# Patient Record
Sex: Female | Born: 1980 | Hispanic: No | Marital: Single | Smoking: Former smoker
Health system: Southern US, Community
[De-identification: ages and names within clinical notes are randomized; demographics above are authoritative.]

## PROBLEM LIST (undated history)

## (undated) DIAGNOSIS — E876 Hypokalemia: Secondary | ICD-10-CM

## (undated) DIAGNOSIS — E538 Deficiency of other specified B group vitamins: Secondary | ICD-10-CM

## (undated) DIAGNOSIS — G4730 Sleep apnea, unspecified: Secondary | ICD-10-CM

## (undated) DIAGNOSIS — F1011 Alcohol abuse, in remission: Secondary | ICD-10-CM

## (undated) DIAGNOSIS — D649 Anemia, unspecified: Secondary | ICD-10-CM

## (undated) DIAGNOSIS — K219 Gastro-esophageal reflux disease without esophagitis: Secondary | ICD-10-CM

## (undated) DIAGNOSIS — G459 Transient cerebral ischemic attack, unspecified: Secondary | ICD-10-CM

## (undated) DIAGNOSIS — M7989 Other specified soft tissue disorders: Secondary | ICD-10-CM

## (undated) DIAGNOSIS — I10 Essential (primary) hypertension: Secondary | ICD-10-CM

## (undated) DIAGNOSIS — M549 Dorsalgia, unspecified: Secondary | ICD-10-CM

## (undated) DIAGNOSIS — K5900 Constipation, unspecified: Secondary | ICD-10-CM

## (undated) DIAGNOSIS — E559 Vitamin D deficiency, unspecified: Secondary | ICD-10-CM

## (undated) DIAGNOSIS — R079 Chest pain, unspecified: Secondary | ICD-10-CM

## (undated) DIAGNOSIS — E611 Iron deficiency: Secondary | ICD-10-CM

## (undated) DIAGNOSIS — E739 Lactose intolerance, unspecified: Secondary | ICD-10-CM

## (undated) HISTORY — DX: Transient cerebral ischemic attack, unspecified: G45.9

## (undated) HISTORY — DX: Constipation, unspecified: K59.00

## (undated) HISTORY — DX: Other specified soft tissue disorders: M79.89

## (undated) HISTORY — DX: Hypokalemia: E87.6

## (undated) HISTORY — DX: Dorsalgia, unspecified: M54.9

## (undated) HISTORY — DX: Chest pain, unspecified: R07.9

## (undated) HISTORY — DX: Lactose intolerance, unspecified: E73.9

## (undated) HISTORY — DX: Deficiency of other specified B group vitamins: E53.8

## (undated) HISTORY — DX: Sleep apnea, unspecified: G47.30

## (undated) HISTORY — DX: Anemia, unspecified: D64.9

## (undated) HISTORY — DX: Alcohol abuse, in remission: F10.11

## (undated) HISTORY — DX: Iron deficiency: E61.1

## (undated) HISTORY — DX: Gastro-esophageal reflux disease without esophagitis: K21.9

## (undated) HISTORY — DX: Vitamin D deficiency, unspecified: E55.9

---

## 2005-11-18 ENCOUNTER — Inpatient Hospital Stay (HOSPITAL_COMMUNITY): Admission: AD | Admit: 2005-11-18 | Discharge: 2005-11-18 | Payer: Self-pay | Admitting: Gynecology

## 2009-10-08 ENCOUNTER — Inpatient Hospital Stay (HOSPITAL_COMMUNITY): Admission: AD | Admit: 2009-10-08 | Discharge: 2009-10-08 | Payer: Self-pay | Admitting: Family Medicine

## 2009-10-08 ENCOUNTER — Ambulatory Visit: Payer: Self-pay | Admitting: Nurse Practitioner

## 2010-04-10 LAB — URINALYSIS, ROUTINE W REFLEX MICROSCOPIC
Glucose, UA: NEGATIVE mg/dL
Ketones, ur: NEGATIVE mg/dL
Protein, ur: NEGATIVE mg/dL

## 2010-04-10 LAB — URINE MICROSCOPIC-ADD ON

## 2010-04-10 LAB — WET PREP, GENITAL
Trich, Wet Prep: NONE SEEN
Yeast Wet Prep HPF POC: NONE SEEN

## 2010-04-10 LAB — GC/CHLAMYDIA PROBE AMP, GENITAL: GC Probe Amp, Genital: NEGATIVE

## 2010-05-03 ENCOUNTER — Emergency Department (HOSPITAL_BASED_OUTPATIENT_CLINIC_OR_DEPARTMENT_OTHER)
Admission: EM | Admit: 2010-05-03 | Discharge: 2010-05-03 | Disposition: A | Discharge status: Home / Self Care | Payer: Medicaid Other | Attending: Emergency Medicine | Admitting: Emergency Medicine

## 2010-05-03 DIAGNOSIS — F17200 Nicotine dependence, unspecified, uncomplicated: Secondary | ICD-10-CM | POA: Insufficient documentation

## 2010-05-03 DIAGNOSIS — Z8679 Personal history of other diseases of the circulatory system: Secondary | ICD-10-CM | POA: Insufficient documentation

## 2010-05-03 DIAGNOSIS — R109 Unspecified abdominal pain: Secondary | ICD-10-CM | POA: Insufficient documentation

## 2010-05-03 DIAGNOSIS — I10 Essential (primary) hypertension: Secondary | ICD-10-CM | POA: Insufficient documentation

## 2010-05-03 DIAGNOSIS — O2690 Pregnancy related conditions, unspecified, unspecified trimester: Secondary | ICD-10-CM | POA: Insufficient documentation

## 2010-05-03 LAB — URINALYSIS, ROUTINE W REFLEX MICROSCOPIC
Glucose, UA: NEGATIVE mg/dL
Hgb urine dipstick: NEGATIVE
Specific Gravity, Urine: 1.025 (ref 1.005–1.030)

## 2010-05-13 ENCOUNTER — Emergency Department (HOSPITAL_BASED_OUTPATIENT_CLINIC_OR_DEPARTMENT_OTHER)
Admission: EM | Admit: 2010-05-13 | Discharge: 2010-05-13 | Disposition: A | Discharge status: Home / Self Care | Payer: Medicaid Other | Attending: Emergency Medicine | Admitting: Emergency Medicine

## 2010-05-13 ENCOUNTER — Encounter (HOSPITAL_BASED_OUTPATIENT_CLINIC_OR_DEPARTMENT_OTHER): Payer: Self-pay | Admitting: Radiology

## 2010-05-13 ENCOUNTER — Emergency Department (INDEPENDENT_AMBULATORY_CARE_PROVIDER_SITE_OTHER): Payer: Medicaid Other

## 2010-05-13 DIAGNOSIS — O200 Threatened abortion: Secondary | ICD-10-CM

## 2010-05-13 DIAGNOSIS — Z8679 Personal history of other diseases of the circulatory system: Secondary | ICD-10-CM | POA: Insufficient documentation

## 2010-05-13 DIAGNOSIS — O2390 Unspecified genitourinary tract infection in pregnancy, unspecified trimester: Secondary | ICD-10-CM | POA: Insufficient documentation

## 2010-05-13 DIAGNOSIS — I10 Essential (primary) hypertension: Secondary | ICD-10-CM | POA: Insufficient documentation

## 2010-05-13 DIAGNOSIS — F17200 Nicotine dependence, unspecified, uncomplicated: Secondary | ICD-10-CM | POA: Insufficient documentation

## 2010-05-13 DIAGNOSIS — N390 Urinary tract infection, site not specified: Secondary | ICD-10-CM | POA: Insufficient documentation

## 2010-05-13 DIAGNOSIS — O209 Hemorrhage in early pregnancy, unspecified: Secondary | ICD-10-CM | POA: Insufficient documentation

## 2010-05-13 LAB — WET PREP, GENITAL
Trich, Wet Prep: NONE SEEN
Yeast Wet Prep HPF POC: NONE SEEN

## 2010-05-13 LAB — URINALYSIS, ROUTINE W REFLEX MICROSCOPIC
Ketones, ur: NEGATIVE mg/dL
Nitrite: NEGATIVE
Protein, ur: NEGATIVE mg/dL

## 2010-05-13 LAB — URINE MICROSCOPIC-ADD ON

## 2010-05-13 LAB — RPR: RPR Ser Ql: NONREACTIVE

## 2010-05-13 LAB — ABO/RH: ABO/RH(D): O POS

## 2010-05-14 LAB — URINE CULTURE: Culture  Setup Time: 201204171851

## 2010-05-14 LAB — GC/CHLAMYDIA PROBE AMP, GENITAL: GC Probe Amp, Genital: NEGATIVE

## 2010-05-16 ENCOUNTER — Inpatient Hospital Stay (HOSPITAL_COMMUNITY)
Admission: AD | Admit: 2010-05-16 | Discharge: 2010-05-16 | Disposition: A | Discharge status: Home / Self Care | Payer: Medicaid Other | Source: Ambulatory Visit | Attending: Obstetrics and Gynecology | Admitting: Obstetrics and Gynecology

## 2010-05-16 DIAGNOSIS — O200 Threatened abortion: Secondary | ICD-10-CM | POA: Insufficient documentation

## 2010-05-16 LAB — HCG, QUANTITATIVE, PREGNANCY: hCG, Beta Chain, Quant, S: 54 m[IU]/mL — ABNORMAL HIGH (ref <5)

## 2010-05-26 ENCOUNTER — Other Ambulatory Visit: Payer: Self-pay

## 2010-07-29 ENCOUNTER — Emergency Department (INDEPENDENT_AMBULATORY_CARE_PROVIDER_SITE_OTHER): Payer: Medicaid Other

## 2010-07-29 ENCOUNTER — Emergency Department (HOSPITAL_BASED_OUTPATIENT_CLINIC_OR_DEPARTMENT_OTHER)
Admission: EM | Admit: 2010-07-29 | Discharge: 2010-07-29 | Disposition: A | Discharge status: Home / Self Care | Payer: Medicaid Other | Attending: Emergency Medicine | Admitting: Emergency Medicine

## 2010-07-29 DIAGNOSIS — N9489 Other specified conditions associated with female genital organs and menstrual cycle: Secondary | ICD-10-CM | POA: Insufficient documentation

## 2010-07-29 DIAGNOSIS — I10 Essential (primary) hypertension: Secondary | ICD-10-CM | POA: Insufficient documentation

## 2010-07-29 DIAGNOSIS — R109 Unspecified abdominal pain: Secondary | ICD-10-CM

## 2010-07-29 DIAGNOSIS — Z8679 Personal history of other diseases of the circulatory system: Secondary | ICD-10-CM | POA: Insufficient documentation

## 2010-07-29 LAB — COMPREHENSIVE METABOLIC PANEL
ALT: 30 U/L (ref 0–35)
AST: 22 U/L (ref 0–37)
Albumin: 3.5 g/dL (ref 3.5–5.2)
Alkaline Phosphatase: 83 U/L (ref 39–117)
Chloride: 103 mEq/L (ref 96–112)
Potassium: 4 mEq/L (ref 3.5–5.1)
Sodium: 138 mEq/L (ref 135–145)
Total Bilirubin: 0.2 mg/dL — ABNORMAL LOW (ref 0.3–1.2)
Total Protein: 7.8 g/dL (ref 6.0–8.3)

## 2010-07-29 LAB — PREGNANCY, URINE: Preg Test, Ur: NEGATIVE

## 2010-07-29 LAB — DIFFERENTIAL
Basophils Absolute: 0 10*3/uL (ref 0.0–0.1)
Basophils Relative: 1 % (ref 0–1)
Eosinophils Absolute: 0.1 10*3/uL (ref 0.0–0.7)
Eosinophils Relative: 2 % (ref 0–5)
Lymphocytes Relative: 25 % (ref 12–46)

## 2010-07-29 LAB — URINALYSIS, ROUTINE W REFLEX MICROSCOPIC
Ketones, ur: NEGATIVE mg/dL
Leukocytes, UA: NEGATIVE
Nitrite: NEGATIVE
Urobilinogen, UA: 0.2 mg/dL (ref 0.0–1.0)
pH: 6 (ref 5.0–8.0)

## 2010-07-29 LAB — CBC
HCT: 39 % (ref 36.0–46.0)
MCHC: 34.4 g/dL (ref 30.0–36.0)
Platelets: 366 10*3/uL (ref 150–400)
RDW: 16.5 % — ABNORMAL HIGH (ref 11.5–15.5)
WBC: 7.7 10*3/uL (ref 4.0–10.5)

## 2010-10-04 ENCOUNTER — Emergency Department (HOSPITAL_BASED_OUTPATIENT_CLINIC_OR_DEPARTMENT_OTHER): Payer: Medicaid Other

## 2010-10-04 ENCOUNTER — Emergency Department (HOSPITAL_BASED_OUTPATIENT_CLINIC_OR_DEPARTMENT_OTHER)
Admission: EM | Admit: 2010-10-04 | Discharge: 2010-10-04 | Discharge status: Against Medical Advice | Payer: Medicaid Other | Attending: Emergency Medicine | Admitting: Emergency Medicine

## 2010-10-04 ENCOUNTER — Encounter (HOSPITAL_BASED_OUTPATIENT_CLINIC_OR_DEPARTMENT_OTHER): Payer: Self-pay | Admitting: *Deleted

## 2010-10-04 DIAGNOSIS — Y92009 Unspecified place in unspecified non-institutional (private) residence as the place of occurrence of the external cause: Secondary | ICD-10-CM | POA: Insufficient documentation

## 2010-10-04 DIAGNOSIS — R51 Headache: Secondary | ICD-10-CM | POA: Insufficient documentation

## 2010-10-04 DIAGNOSIS — R1032 Left lower quadrant pain: Secondary | ICD-10-CM | POA: Insufficient documentation

## 2010-10-04 DIAGNOSIS — I10 Essential (primary) hypertension: Secondary | ICD-10-CM | POA: Insufficient documentation

## 2010-10-04 DIAGNOSIS — R109 Unspecified abdominal pain: Secondary | ICD-10-CM

## 2010-10-04 DIAGNOSIS — O2690 Pregnancy related conditions, unspecified, unspecified trimester: Secondary | ICD-10-CM | POA: Insufficient documentation

## 2010-10-04 DIAGNOSIS — M79609 Pain in unspecified limb: Secondary | ICD-10-CM | POA: Insufficient documentation

## 2010-10-04 HISTORY — DX: Essential (primary) hypertension: I10

## 2010-10-04 LAB — URINALYSIS, ROUTINE W REFLEX MICROSCOPIC
Glucose, UA: NEGATIVE mg/dL
Hgb urine dipstick: NEGATIVE
Ketones, ur: NEGATIVE mg/dL
Leukocytes, UA: NEGATIVE
Protein, ur: NEGATIVE mg/dL

## 2010-10-04 LAB — BASIC METABOLIC PANEL
BUN: 11 mg/dL (ref 6–23)
CO2: 22 mEq/L (ref 19–32)
Calcium: 9.7 mg/dL (ref 8.4–10.5)
Creatinine, Ser: 0.7 mg/dL (ref 0.50–1.10)
Glucose, Bld: 91 mg/dL (ref 70–99)

## 2010-10-04 LAB — CBC
HCT: 41.6 % (ref 36.0–46.0)
Hemoglobin: 14.3 g/dL (ref 12.0–15.0)
MCH: 27.4 pg (ref 26.0–34.0)
MCHC: 34.4 g/dL (ref 30.0–36.0)

## 2010-10-04 MED ORDER — ACETAMINOPHEN 80 MG PO CHEW: 650.0000 mg | CHEWABLE_TABLET | Freq: Once | ORAL | Status: DC

## 2010-10-04 MED ORDER — ACETAMINOPHEN 325 MG PO TABS
650.0000 mg | ORAL_TABLET | Freq: Once | ORAL | Status: AC
  Administered 2010-10-04: 650 mg via ORAL
  Filled 2010-10-04: qty 1

## 2010-10-04 NOTE — ED Notes (Signed)
Unable to locate pt in room or wtg room; pt belongings are not in room.  PA notified.

## 2010-10-04 NOTE — ED Notes (Signed)
Pt refused XR
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
## 2010-10-06 NOTE — ED Provider Notes (Signed)
Medical screening examination/treatment/procedure(s) were performed by non-physician practitioner and as supervising physician I was immediately available for consultation/collaboration.  Forbes Cellar, MD 10/06/10 [phone]

## 2011-12-07 ENCOUNTER — Emergency Department (HOSPITAL_BASED_OUTPATIENT_CLINIC_OR_DEPARTMENT_OTHER)
Admission: EM | Admit: 2011-12-07 | Discharge: 2011-12-07 | Disposition: A | Discharge status: Home / Self Care | Payer: Self-pay | Attending: Emergency Medicine | Admitting: Emergency Medicine

## 2011-12-07 ENCOUNTER — Encounter (HOSPITAL_BASED_OUTPATIENT_CLINIC_OR_DEPARTMENT_OTHER): Payer: Self-pay | Admitting: Family Medicine

## 2011-12-07 DIAGNOSIS — N938 Other specified abnormal uterine and vaginal bleeding: Secondary | ICD-10-CM | POA: Insufficient documentation

## 2011-12-07 DIAGNOSIS — N949 Unspecified condition associated with female genital organs and menstrual cycle: Secondary | ICD-10-CM | POA: Insufficient documentation

## 2011-12-07 DIAGNOSIS — I10 Essential (primary) hypertension: Secondary | ICD-10-CM | POA: Insufficient documentation

## 2011-12-07 DIAGNOSIS — Z79899 Other long term (current) drug therapy: Secondary | ICD-10-CM | POA: Insufficient documentation

## 2011-12-07 DIAGNOSIS — F17200 Nicotine dependence, unspecified, uncomplicated: Secondary | ICD-10-CM | POA: Insufficient documentation

## 2011-12-07 LAB — BASIC METABOLIC PANEL
GFR calc non Af Amer: >90 mL/min (ref >90)
Glucose, Bld: 89 mg/dL (ref 70–99)
Potassium: 3.7 mEq/L (ref 3.5–5.1)
Sodium: 139 mEq/L (ref 135–145)

## 2011-12-07 LAB — CBC WITH DIFFERENTIAL/PLATELET
Eosinophils Absolute: 0.1 10*3/uL (ref 0.0–0.7)
Lymphocytes Relative: 30 % (ref 12–46)
Lymphs Abs: 1.4 10*3/uL (ref 0.7–4.0)
MCH: 25.4 pg — ABNORMAL LOW (ref 26.0–34.0)
Neutrophils Relative %: 56 % (ref 43–77)
Platelets: 349 10*3/uL (ref 150–400)
RBC: 4.89 MIL/uL (ref 3.87–5.11)
WBC: 4.8 10*3/uL (ref 4.0–10.5)

## 2011-12-07 LAB — WET PREP, GENITAL: Trich, Wet Prep: NONE SEEN

## 2011-12-07 LAB — HCG, QUANTITATIVE, PREGNANCY: hCG, Beta Chain, Quant, S: <1 m[IU]/mL (ref <5)

## 2011-12-07 NOTE — ED Notes (Signed)
Pt c/o vaginal bleeding and thinks she "might have been pregnant but not sure".

## 2011-12-07 NOTE — ED Provider Notes (Signed)
History     CSN: [id number]  Arrival date & time 12/07/11  1421   First MD Initiated Contact with Patient 12/07/11 1442      Chief Complaint  Patient presents with  . Vaginal Bleeding    (Consider location/radiation/quality/duration/timing/severity/associated sxs/prior treatment) HPI Comments: Patient presents with complaint of "possible miscarriage". Patient states that has had 2 miscarriages in the past to include and ectopic. Patient is sexually active with one partner and no using contraception. She also states that her LMP: was 10/18/11 and normal. She states that she missed her period in October but began bleeding heavily today. Patient states she took a pregnancy test in October and it looked like it might have been positive. Denies fever or chills. Denies NVD or abdominal pain. Denies vaginal discharge or dyspareunia.  The history is provided by the patient. No language interpreter was used.    Past Medical History  Diagnosis Date  . Hypertension     History reviewed. No pertinent past surgical history.  No family history on file.  History  Substance Use Topics  . Smoking status: Current Every Day Smoker  . Smokeless tobacco: Not on file  . Alcohol Use: No    OB History    Grav Para Term Preterm Abortions TAB SAB Ect Mult Living   1               Review of Systems  Constitutional: Negative for fever and chills.  HENT: Negative for neck pain.   Gastrointestinal: Negative for nausea, vomiting and abdominal pain.  Genitourinary: Positive for vaginal bleeding and menstrual problem. Negative for vaginal discharge and dyspareunia.  Skin: Negative for rash.    Allergies  Latex and Other  Home Medications   Current Outpatient Rx  Name  Route  Sig  Dispense  Refill  . CLONIDINE HCL 0.2 MG PO TABS   Oral   Take 0.2 mg by mouth at bedtime.           BP 187/106  Pulse 97  Temp 99.1 F (37.3 C) (Oral)  Resp 18  Ht 5\' 1"  (1.549 m)  Wt 230 lb (104.327  kg)  BMI 43.46 kg/m2  SpO2 100%  LMP 10/18/2011  Breastfeeding? Unknown  Physical Exam  Nursing note and vitals reviewed. Constitutional: She appears well-developed and well-nourished.  HENT:  Head: Normocephalic and atraumatic.  Mouth/Throat: Oropharynx is clear and moist. No oropharyngeal exudate.  Eyes: Pupils are equal, round, and reactive to light. No scleral icterus.  Neck: Normal range of motion. Neck supple.  Cardiovascular: Normal rate, regular rhythm and normal heart sounds.   Pulmonary/Chest: Effort normal and breath sounds normal.  Abdominal: Soft. Bowel sounds are normal. There is no tenderness.  Genitourinary: Vagina normal and uterus normal. No vaginal discharge found.       Cervical os closed with moderate amount blood in the vaginal canal. No CMT or adnexal tenderness.  Lymphadenopathy:    She has no cervical adenopathy.  Neurological: She is alert.  Skin: Skin is warm and dry.    ED Course  Procedures (including critical care time)  Labs Reviewed  WET PREP, GENITAL - Abnormal; Notable for the following:    Clue Cells Wet Prep HPF POC FEW (*)     WBC, Wet Prep HPF POC RARE (*)     All other components within normal limits  CBC WITH DIFFERENTIAL - Abnormal; Notable for the following:    MCV 77.1 (*)     MCH 25.4 (*)  RDW 18.5 (*)     All other components within normal limits  PREGNANCY, URINE  BASIC METABOLIC PANEL  HCG, QUANTITATIVE, PREGNANCY  GC/CHLAMYDIA PROBE AMP   Results for orders placed during the hospital encounter of 12/07/11  PREGNANCY, URINE      Component Value Range   Preg Test, Ur NEGATIVE  NEGATIVE  WET PREP, GENITAL      Component Value Range   Yeast Wet Prep HPF POC NONE SEEN  NONE SEEN   Trich, Wet Prep NONE SEEN  NONE SEEN   Clue Cells Wet Prep HPF POC FEW (*) NONE SEEN   WBC, Wet Prep HPF POC RARE (*) NONE SEEN  CBC WITH DIFFERENTIAL      Component Value Range   WBC 4.8  4.0 - 10.5 K/uL   RBC 4.89  3.87 - 5.11 MIL/uL    Hemoglobin 12.4  12.0 - 15.0 g/dL   HCT 19.1  47.8 - 29.5 %   MCV 77.1 (*) 78.0 - 100.0 fL   MCH 25.4 (*) 26.0 - 34.0 pg   MCHC 32.9  30.0 - 36.0 g/dL   RDW 62.1 (*) 30.8 - 65.7 %   Platelets 349  150 - 400 K/uL   Neutrophils Relative 56  43 - 77 %   Neutro Abs 2.7  1.7 - 7.7 K/uL   Lymphocytes Relative 30  12 - 46 %   Lymphs Abs 1.4  0.7 - 4.0 K/uL   Monocytes Relative 11  3 - 12 %   Monocytes Absolute 0.5  0.1 - 1.0 K/uL   Eosinophils Relative 2  0 - 5 %   Eosinophils Absolute 0.1  0.0 - 0.7 K/uL   Basophils Relative 1  0 - 1 %   Basophils Absolute 0.0  0.0 - 0.1 K/uL  BASIC METABOLIC PANEL      Component Value Range   Sodium 139  135 - 145 mEq/L   Potassium 3.7  3.5 - 5.1 mEq/L   Chloride 104  96 - 112 mEq/L   CO2 23  19 - 32 mEq/L   Glucose, Bld 89  70 - 99 mg/dL   BUN 10  6 - 23 mg/dL   Creatinine, Ser 8.46  0.50 - 1.10 mg/dL   Calcium 8.5  8.4 - 96.2 mg/dL   GFR calc non Af Amer >90  >90 mL/min   GFR calc Af Amer >90  >90 mL/min  HCG, QUANTITATIVE, PREGNANCY      Component Value Range   hCG, Beta Chain, Quant, S <1  <5 mIU/mL    No results found.   1. Dysfunctional uterine bleeding       MDM  Patient presented with dysfunctional uterine bleeding. Pregnancy test and Hcg quant unremarkable. Wet prep: unremarkable. Patient reassured that this may be a late period but needs further evaluation. Patient has follow-up appointment with her OB/GYN on Thurs. Return precautions given verbally and in discharge summary. No red flags for missed, threatened abortion, or PID.        Pixie Casino, PA-C 12/07/11 2322

## 2011-12-08 NOTE — ED Provider Notes (Signed)
Medical screening examination/treatment/procedure(s) were performed by non-physician practitioner and as supervising physician I was immediately available for consultation/collaboration.   Reeshemah Nazaryan, MD 12/08/11 0011 

## 2013-11-27 ENCOUNTER — Encounter (HOSPITAL_BASED_OUTPATIENT_CLINIC_OR_DEPARTMENT_OTHER): Payer: Self-pay | Admitting: Family Medicine

## 2014-07-11 ENCOUNTER — Ambulatory Visit (INDEPENDENT_AMBULATORY_CARE_PROVIDER_SITE_OTHER): Payer: 59 | Admitting: Family Medicine

## 2014-07-11 VITALS — BP 180/120 | Temp 98.6°F | Resp 17 | Ht 62.0 in | Wt 274.6 lb

## 2014-07-11 DIAGNOSIS — I10 Essential (primary) hypertension: Secondary | ICD-10-CM | POA: Diagnosis not present

## 2014-07-11 DIAGNOSIS — T781XXA Other adverse food reactions, not elsewhere classified, initial encounter: Secondary | ICD-10-CM

## 2014-07-11 LAB — CBC
HCT: 38.2 % (ref 36.0–46.0)
Hemoglobin: 11.7 g/dL — ABNORMAL LOW (ref 12.0–15.0)
MCH: 22.7 pg — AB (ref 26.0–34.0)
MCHC: 30.6 g/dL (ref 30.0–36.0)
MCV: 74 fL — AB (ref 78.0–100.0)
MPV: 8.8 fL (ref 8.6–12.4)
PLATELETS: 609 10*3/uL — AB (ref 150–400)
RBC: 5.16 MIL/uL — AB (ref 3.87–5.11)
RDW: 20 % — AB (ref 11.5–15.5)
WBC: 6.7 10*3/uL (ref 4.0–10.5)

## 2014-07-11 LAB — COMPREHENSIVE METABOLIC PANEL
ALBUMIN: 3.9 g/dL (ref 3.5–5.2)
ALT: 28 U/L (ref 0–35)
AST: 21 U/L (ref 0–37)
Alkaline Phosphatase: 77 U/L (ref 39–117)
BUN: 14 mg/dL (ref 6–23)
CALCIUM: 9.5 mg/dL (ref 8.4–10.5)
CHLORIDE: 106 meq/L (ref 96–112)
CO2: 25 meq/L (ref 19–32)
Creat: 0.93 mg/dL (ref 0.50–1.10)
Glucose, Bld: 77 mg/dL (ref 70–99)
POTASSIUM: 4.5 meq/L (ref 3.5–5.3)
SODIUM: 139 meq/L (ref 135–145)
TOTAL PROTEIN: 7.6 g/dL (ref 6.0–8.3)
Total Bilirubin: 0.2 mg/dL (ref 0.2–1.2)

## 2014-07-11 LAB — HEMOGLOBIN A1C
Hgb A1c MFr Bld: 5.6 % (ref <5.7)
Mean Plasma Glucose: 114 mg/dL (ref <117)

## 2014-07-11 LAB — TSH: TSH: 1.287 u[IU]/mL (ref 0.350–4.500)

## 2014-07-11 MED ORDER — ADULT BLOOD PRESSURE CUFF LG KIT: 1.0000 [IU] | PACK | Freq: Every day | Status: DC

## 2014-07-11 MED ORDER — AMLODIPINE BESYLATE 5 MG PO TABS: 5.0000 mg | ORAL_TABLET | Freq: Every day | ORAL | Status: DC

## 2014-07-11 NOTE — Patient Instructions (Addendum)
Try quinoa   Hypertension Hypertension, commonly called high blood pressure, is when the force of blood pumping through your arteries is too strong. Your arteries are the blood vessels that carry blood from your heart throughout your body. A blood pressure reading consists of a higher number over a lower number, such as 110/72. The higher number (systolic) is the pressure inside your arteries when your heart pumps. The lower number (diastolic) is the pressure inside your arteries when your heart relaxes. Ideally you want your blood pressure below 120/80. Hypertension forces your heart to work harder to pump blood. Your arteries may become narrow or stiff. Having hypertension puts you at risk for heart disease, stroke, and other problems.  RISK FACTORS Some risk factors for high blood pressure are controllable. Others are not.  Risk factors you cannot control include:   Race. You may be at higher risk if you are African American.  Age. Risk increases with age.  Gender. Men are at higher risk than women before age 59 years. After age 18, women are at higher risk than men. Risk factors you can control include:  Not getting enough exercise or physical activity.  Being overweight.  Getting too much fat, sugar, calories, or salt in your diet.  Drinking too much alcohol. SIGNS AND SYMPTOMS Hypertension does not usually cause signs or symptoms. Extremely high blood pressure (hypertensive crisis) may cause headache, anxiety, shortness of breath, and nosebleed. DIAGNOSIS  To check if you have hypertension, your health care provider will measure your blood pressure while you are seated, with your arm held at the level of your heart. It should be measured at least twice using the same arm. Certain conditions can cause a difference in blood pressure between your right and left arms. A blood pressure reading that is higher than normal on one occasion does not mean that you need treatment. If one blood  pressure reading is high, ask your health care provider about having it checked again. TREATMENT  Treating high blood pressure includes making lifestyle changes and possibly taking medicine. Living a healthy lifestyle can help lower high blood pressure. You may need to change some of your habits. Lifestyle changes may include:  Following the DASH diet. This diet is high in fruits, vegetables, and whole grains. It is low in salt, red meat, and added sugars.  Getting at least 2 hours of brisk physical activity every week.  Losing weight if necessary.  Not smoking.  Limiting alcoholic beverages.  Learning ways to reduce stress. If lifestyle changes are not enough to get your blood pressure under control, your health care provider may prescribe medicine. You may need to take more than one. Work closely with your health care provider to understand the risks and benefits. HOME CARE INSTRUCTIONS  Have your blood pressure rechecked as directed by your health care provider.   Take medicines only as directed by your health care provider. Follow the directions carefully. Blood pressure medicines must be taken as prescribed. The medicine does not work as well when you skip doses. Skipping doses also puts you at risk for problems.   Do not smoke.   Monitor your blood pressure at home as directed by your health care provider. SEEK MEDICAL CARE IF:   You think you are having a reaction to medicines taken.  You have recurrent headaches or feel dizzy.  You have swelling in your ankles.  You have trouble with your vision. SEEK IMMEDIATE MEDICAL CARE IF:  You develop a  severe headache or confusion.  You have unusual weakness, numbness, or feel faint.  You have severe chest or abdominal pain.  You vomit repeatedly.  You have trouble breathing. MAKE SURE YOU:   Understand these instructions.  Will watch your condition.  Will get help right away if you are not doing well or get  worse. Document Released: 01/12/2005 Document Revised: 05/29/2013 Document Reviewed: 11/04/2012 [facility] Patient Information 2015 [place], [place]. This information is not intended to replace advice given to you by your health care provider. Make sure you discuss any questions you have with your health care provider. How to Take Your Blood Pressure HOW DO I GET A BLOOD PRESSURE MACHINE?  You can buy an electronic home blood pressure machine at your local pharmacy. Insurance will sometimes cover the cost if you have a prescription.  Ask your doctor what type of machine is best for you. There are different machines for your arm and your wrist.  If you decide to buy a machine to check your blood pressure on your arm, first check the size of your arm so you can buy the right size cuff. To check the size of your arm:   Use a measuring tape that shows both inches and centimeters.   Wrap the measuring tape around the upper-middle part of your arm. You may need someone to help you measure.   Write down your arm measurement in both inches and centimeters.   To measure your blood pressure correctly, it is important to have the right size cuff.   If your arm is up to 13 inches (up to 34 centimeters), get an adult cuff size.  If your arm is 13 to 17 inches (35 to 44 centimeters), get a large adult cuff size.    If your arm is 17 to 20 inches (45 to 52 centimeters), get an adult thigh cuff.  WHAT DO THE NUMBERS MEAN?   There are two numbers that make up your blood pressure. For example: 120/80.  The first number (120 in our example) is called the "systolic pressure." It is a measure of the pressure in your blood vessels when your heart is pumping blood.  The second number (80 in our example) is called the "diastolic pressure." It is a measure of the pressure in your blood vessels when your heart is resting between beats.  Your doctor will tell you what your blood pressure should be. WHAT  SHOULD I DO BEFORE I CHECK MY BLOOD PRESSURE?   Try to rest or relax for at least 30 minutes before you check your blood pressure.  Do not smoke.  Do not have any drinks with caffeine, such as:  Soda.  Coffee.  Tea.  Check your blood pressure in a quiet room.  Sit down and stretch out your arm on a table. Keep your arm at about the level of your heart. Let your arm relax.  Make sure that your legs are not crossed. HOW DO I CHECK MY BLOOD PRESSURE?  Follow the directions that came with your machine.  Make sure you remove any tight-fighting clothing from your arm or wrist. Wrap the cuff around your upper arm or wrist. You should be able to fit a finger between the cuff and your arm. If you cannot fit a finger between the cuff and your arm, it is too tight and should be removed and rewrapped.  Some units require you to manually pump up the arm cuff.  Automatic units inflate the cuff  when you press a button.  Cuff deflation is automatic in both models.  After the cuff is inflated, the unit measures your blood pressure and pulse. The readings are shown on a monitor. Hold still and breathe normally while the cuff is inflated.  Getting a reading takes less than a minute.  Some models store readings in a memory. Some provide a printout of readings. If your machine does not store your readings, keep a written record.  Take readings with you to your next visit with your doctor. Document Released: 12/26/2007 Document Revised: 05/29/2013 Document Reviewed: 03/09/2013 [facility] Patient Information 2015 [place], [place]. This information is not intended to replace advice given to you by your health care provider. Make sure you discuss any questions you have with your health care provider.    Why follow it? Research shows. . Those who follow the Mediterranean diet have a reduced risk of heart disease  . The diet is associated with a reduced incidence of Parkinson's and Alzheimer's  diseases . People following the diet may have longer life expectancies and lower rates of chronic diseases  . The Dietary Guidelines for Americans recommends the Mediterranean diet as an eating plan to promote health and prevent disease  What Is the Mediterranean Diet?  . Healthy eating plan based on typical foods and recipes of Mediterranean-style cooking . The diet is primarily a plant based diet; these foods should make up a majority of meals   Starches - Plant based foods should make up a majority of meals - They are an important sources of vitamins, minerals, energy, antioxidants, and fiber - Choose whole grains, foods high in fiber and minimally processed items  - Typical grain sources include wheat, oats, barley, corn, brown rice, bulgar, farro, millet, polenta, couscous  - Various types of beans include chickpeas, lentils, fava beans, black beans, white beans   Fruits  Veggies - Large quantities of antioxidant rich fruits & veggies; 6 or more servings  - Vegetables can be eaten raw or lightly drizzled with oil and cooked  - Vegetables common to the traditional Mediterranean Diet include: artichokes, arugula, beets, broccoli, brussel sprouts, cabbage, carrots, celery, collard greens, cucumbers, eggplant, kale, leeks, lemons, lettuce, mushrooms, okra, onions, peas, peppers, potatoes, pumpkin, radishes, rutabaga, shallots, spinach, sweet potatoes, turnips, zucchini - Fruits common to the Mediterranean Diet include: apples, apricots, avocados, cherries, clementines, dates, figs, grapefruits, grapes, melons, nectarines, oranges, peaches, pears, pomegranates, strawberries, tangerines  Fats - Replace butter and margarine with healthy oils, such as olive oil, canola oil, and tahini  - Limit nuts to no more than a handful a day  - Nuts include walnuts, almonds, pecans, pistachios, pine nuts  - Limit or avoid candied, honey roasted or heavily salted nuts - Olives are central to the [employer] - can be eaten whole or used in a variety of dishes   Meats Protein - Limiting red meat: no more than a few times a month - When eating red meat: choose lean cuts and keep the portion to the size of deck of cards - Eggs: approx. 0 to 4 times a week  - Fish and lean poultry: at least 2 a week  - Healthy protein sources include, chicken, [place], lean beef, lamb - Increase intake of seafood such as tuna, salmon, trout, mackerel, shrimp, scallops - Avoid or limit high fat processed meats such as sausage and bacon  Dairy - Include moderate amounts of low fat dairy products  - Focus on healthy dairy  such as fat free yogurt, skim milk, low or reduced fat cheese - Limit dairy products higher in fat such as whole or 2% milk, cheese, ice cream  Alcohol - Moderate amounts of red wine is ok  - No more than 5 oz daily for women (all ages) and men older than age 25  - No more than 10 oz of wine daily for men younger than 76  Other - Limit sweets and other desserts  - Use herbs and spices instead of salt to flavor foods  - Herbs and spices common to the traditional Mediterranean Diet include: basil, bay leaves, chives, cloves, cumin, fennel, garlic, lavender, marjoram, mint, oregano, parsley, pepper, rosemary, sage, savory, sumac, tarragon, thyme   It's not just a diet, it's a lifestyle:  . The Mediterranean diet includes lifestyle factors typical of those in the region  . Foods, drinks and meals are best eaten with others and savored . Daily physical activity is important for overall good health . This could be strenuous exercise like running and aerobics . This could also be more leisurely activities such as walking, housework, yard-work, or taking the stairs . Moderation is the key; a balanced and healthy diet accommodates most foods and drinks . Consider portion sizes and frequency of consumption of certain foods   Meal Ideas & Options:  . Breakfast:  o Whole wheat toast or whole wheat English  muffins with peanut butter & hard boiled egg o Steel cut oats topped with apples & cinnamon and skim milk  o Fresh fruit: banana, strawberries, melon, berries, peaches  o Smoothies: strawberries, bananas, greek yogurt, peanut butter o Low fat greek yogurt with blueberries and granola  o Egg white omelet with spinach and mushrooms o Breakfast couscous: whole wheat couscous, apricots, skim milk, cranberries  . Sandwiches:  o Hummus and grilled vegetables (peppers, zucchini, squash) on whole wheat bread   o Grilled chicken on whole wheat pita with lettuce, tomatoes, cucumbers or tzatziki  o Tuna salad on whole wheat bread: tuna salad made with greek yogurt, olives, red peppers, capers, green onions o Garlic rosemary lamb pita: lamb sauted with garlic, rosemary, salt & pepper; add lettuce, cucumber, greek yogurt to pita - flavor with lemon juice and black pepper  . Seafood:  o Mediterranean grilled salmon, seasoned with garlic, basil, parsley, lemon juice and black pepper o Shrimp, lemon, and spinach whole-grain pasta salad made with low fat greek yogurt  o Seared scallops with lemon orzo  o Seared tuna steaks seasoned salt, pepper, coriander topped with tomato mixture of olives, tomatoes, olive oil, minced garlic, parsley, green onions and cappers  . Meats:  o Herbed greek chicken salad with kalamata olives, cucumber, feta  o Red bell peppers stuffed with spinach, bulgur, lean ground beef (or lentils) & topped with feta   o Kebabs: skewers of chicken, tomatoes, onions, zucchini, squash  o [place] burgers: made with red onions, mint, dill, lemon juice, feta cheese topped with roasted red peppers . Vegetarian o Cucumber salad: cucumbers, artichoke hearts, celery, red onion, feta cheese, tossed in olive oil & lemon juice  o Hummus and whole grain pita points with a greek salad (lettuce, tomato, feta, olives, cucumbers, red onion) o Lentil soup with celery, carrots made with vegetable broth,  garlic, salt and pepper  o Tabouli salad: parsley, bulgur, mint, scallions, cucumbers, tomato, radishes, lemon juice, olive oil, salt and pepper.

## 2014-07-11 NOTE — Progress Notes (Signed)
   Subjective:    Patient ID: Cristina Adkins, female    DOB: 1981/01/25, 34 y.o.   MRN: [id number]  HPI This is a pleasant 34 yo female who presents today for HTN. She was diagnosed with HTN when she was a teenager. She has intermittently been on medication. She was most recently on clonidine which caused her to be fatigued so she stopped taking it. She had a miscarriage and believes it was related to her blood pressure medication. She is not currently sexually active or in a relationship.   She lives with her 9 yo daughter. They walk daily for exercise. She has struggled for a long time with her weight. She doesn't eat breakfast, she has a large flavored [employer] coffee for lunch and she cooks for dinner (fish/meat, vegetables). She has ear itching/popping, itching throat and lip swelling with eating fruits. She has had to avoid eating fruit for several years and reports that this extends to all fruits except apples which cause her to vomit. She does not like too many vegetables or salads. She does not drink regular soda, sweet tea or juice. She smokes 4-6 cigarettes a day. She works for [employer] and has a lot of stress with her job. She doesn't sleep well- difficulty falling and staying asleep.   She "rolled" her right ankle about a year ago and it still hurts and swells.   Review of Systems No chest pain, no SOB, right ankle swelling related to old injury, no headaches.     Objective:   Physical Exam  Constitutional: She is oriented to person, place, and time. She appears well-developed and well-nourished.  Morbidly obese.  HENT:  Head: Normocephalic and atraumatic.  Eyes: Conjunctivae are normal. Pupils are equal, round, and reactive to light.  Neck: Normal range of motion. Neck supple.  Cardiovascular: Normal rate, regular rhythm and normal heart sounds.   Pulmonary/Chest: Effort normal and breath sounds normal.  Musculoskeletal: Normal range of motion. She exhibits edema  (trace pretibial edema. Right, lateral ankle mildly swollen. ).  Neurological: She is alert and oriented to person, place, and time.  Skin: Skin is warm and dry.  Psychiatric: She has a normal mood and affect. Her behavior is normal. Judgment and thought content normal.  Vitals reviewed.  BP 180/20 mmHg  Temp(Src) 98.6 F (37 C) (Oral)  Resp 17  Ht $R'5\' 2"'Vh$  (1.575 m)  Wt 274 lb 9.6 oz (124.558 kg)  BMI 50.21 kg/m2  SpO2 95%  LMP 06/27/2014 Recheck of blood pressure- 174/98    Assessment & Plan:  1. Essential hypertension - amLODipine (NORVASC) 5 MG tablet; Take 1 tablet (5 mg total) by mouth daily.  Dispense: 90 tablet; Refill: 3 - Blood Pressure Monitoring (ADULT BLOOD PRESSURE CUFF LG) KIT; 1 Units by Does not apply route daily.  Dispense: 1 each; Refill: 0 - CBC - Comprehensive metabolic panel - follow up in 1 month  2. Morbid obesity - CBC - Comprehensive metabolic panel - TSH - Hemoglobin A1c - discussed need for regular, small meals, continued exercise. Encouraged slow and steady weight loss of 1/2-1 pound per week.  3. Allergic reaction to food - Ambulatory referral to [place], FNP-BC  [facility]  07/11/2014 2:18 PM

## 2014-07-16 ENCOUNTER — Other Ambulatory Visit: Payer: Self-pay | Admitting: Family Medicine

## 2014-07-16 DIAGNOSIS — D649 Anemia, unspecified: Secondary | ICD-10-CM

## 2014-07-16 MED ORDER — FERROUS SULFATE 325 (65 FE) MG PO TABS: 325.0000 mg | ORAL_TABLET | Freq: Two times a day (BID) | ORAL | Status: DC

## 2015-07-11 ENCOUNTER — Other Ambulatory Visit: Payer: Self-pay

## 2015-07-11 DIAGNOSIS — I10 Essential (primary) hypertension: Secondary | ICD-10-CM

## 2015-07-11 MED ORDER — AMLODIPINE BESYLATE 5 MG PO TABS: 5.0000 mg | ORAL_TABLET | Freq: Every day | ORAL | Status: DC

## 2015-11-24 ENCOUNTER — Other Ambulatory Visit: Payer: Self-pay | Admitting: Family Medicine

## 2015-11-24 DIAGNOSIS — I10 Essential (primary) hypertension: Secondary | ICD-10-CM

## 2017-03-29 ENCOUNTER — Encounter (HOSPITAL_BASED_OUTPATIENT_CLINIC_OR_DEPARTMENT_OTHER): Payer: Self-pay | Admitting: Emergency Medicine

## 2017-03-29 ENCOUNTER — Other Ambulatory Visit: Payer: Self-pay

## 2017-03-29 ENCOUNTER — Inpatient Hospital Stay (HOSPITAL_BASED_OUTPATIENT_CLINIC_OR_DEPARTMENT_OTHER): Payer: 59

## 2017-03-29 ENCOUNTER — Observation Stay (HOSPITAL_BASED_OUTPATIENT_CLINIC_OR_DEPARTMENT_OTHER)
Admission: EM | Admit: 2017-03-29 | Discharge: 2017-03-30 | Disposition: A | Discharge status: Home / Self Care | Payer: 59 | Attending: Internal Medicine | Admitting: Internal Medicine

## 2017-03-29 ENCOUNTER — Inpatient Hospital Stay (HOSPITAL_COMMUNITY): Payer: 59

## 2017-03-29 DIAGNOSIS — Z720 Tobacco use: Secondary | ICD-10-CM

## 2017-03-29 DIAGNOSIS — F17210 Nicotine dependence, cigarettes, uncomplicated: Secondary | ICD-10-CM | POA: Diagnosis not present

## 2017-03-29 DIAGNOSIS — D649 Anemia, unspecified: Secondary | ICD-10-CM | POA: Diagnosis not present

## 2017-03-29 DIAGNOSIS — R778 Other specified abnormalities of plasma proteins: Secondary | ICD-10-CM | POA: Diagnosis present

## 2017-03-29 DIAGNOSIS — Z79899 Other long term (current) drug therapy: Secondary | ICD-10-CM | POA: Diagnosis not present

## 2017-03-29 DIAGNOSIS — Z7982 Long term (current) use of aspirin: Secondary | ICD-10-CM | POA: Diagnosis not present

## 2017-03-29 DIAGNOSIS — Z6841 Body Mass Index (BMI) 40.0 and over, adult: Secondary | ICD-10-CM | POA: Insufficient documentation

## 2017-03-29 DIAGNOSIS — R748 Abnormal levels of other serum enzymes: Secondary | ICD-10-CM | POA: Diagnosis not present

## 2017-03-29 DIAGNOSIS — I517 Cardiomegaly: Secondary | ICD-10-CM

## 2017-03-29 DIAGNOSIS — Z87891 Personal history of nicotine dependence: Secondary | ICD-10-CM | POA: Diagnosis present

## 2017-03-29 DIAGNOSIS — Z91040 Latex allergy status: Secondary | ICD-10-CM | POA: Diagnosis not present

## 2017-03-29 DIAGNOSIS — R072 Precordial pain: Secondary | ICD-10-CM

## 2017-03-29 DIAGNOSIS — R7989 Other specified abnormal findings of blood chemistry: Secondary | ICD-10-CM | POA: Diagnosis present

## 2017-03-29 DIAGNOSIS — I119 Hypertensive heart disease without heart failure: Secondary | ICD-10-CM | POA: Diagnosis not present

## 2017-03-29 DIAGNOSIS — Z91018 Allergy to other foods: Secondary | ICD-10-CM | POA: Insufficient documentation

## 2017-03-29 DIAGNOSIS — Z8249 Family history of ischemic heart disease and other diseases of the circulatory system: Secondary | ICD-10-CM | POA: Insufficient documentation

## 2017-03-29 DIAGNOSIS — I10 Essential (primary) hypertension: Secondary | ICD-10-CM

## 2017-03-29 DIAGNOSIS — R200 Anesthesia of skin: Secondary | ICD-10-CM | POA: Insufficient documentation

## 2017-03-29 DIAGNOSIS — I160 Hypertensive urgency: Secondary | ICD-10-CM | POA: Diagnosis not present

## 2017-03-29 DIAGNOSIS — N920 Excessive and frequent menstruation with regular cycle: Secondary | ICD-10-CM | POA: Diagnosis not present

## 2017-03-29 LAB — URINALYSIS, MICROSCOPIC (REFLEX)

## 2017-03-29 LAB — CBC WITH DIFFERENTIAL/PLATELET
BASOS ABS: 0 10*3/uL (ref 0.0–0.1)
Basophils Relative: 0 %
EOS ABS: 0.3 10*3/uL (ref 0.0–0.7)
Eosinophils Relative: 3 %
HCT: 32.3 % — ABNORMAL LOW (ref 36.0–46.0)
HEMOGLOBIN: 9.9 g/dL — AB (ref 12.0–15.0)
LYMPHS PCT: 19 %
Lymphs Abs: 2.2 10*3/uL (ref 0.7–4.0)
MCH: 20.1 pg — ABNORMAL LOW (ref 26.0–34.0)
MCHC: 30.7 g/dL (ref 30.0–36.0)
MCV: 65.5 fL — ABNORMAL LOW (ref 78.0–100.0)
MONOS PCT: 6 %
Monocytes Absolute: 0.7 10*3/uL (ref 0.1–1.0)
NEUTROS ABS: 8.3 10*3/uL — AB (ref 1.7–7.7)
Neutrophils Relative %: 72 %
Platelets: 519 10*3/uL — ABNORMAL HIGH (ref 150–400)
RBC: 4.93 MIL/uL (ref 3.87–5.11)
RDW: 23.6 % — ABNORMAL HIGH (ref 11.5–15.5)
WBC: 11.5 10*3/uL — ABNORMAL HIGH (ref 4.0–10.5)

## 2017-03-29 LAB — BASIC METABOLIC PANEL
ANION GAP: 8 (ref 5–15)
BUN: 19 mg/dL (ref 6–20)
CO2: 26 mmol/L (ref 22–32)
Calcium: 8.9 mg/dL (ref 8.9–10.3)
Chloride: 104 mmol/L (ref 101–111)
Creatinine, Ser: 1.09 mg/dL — ABNORMAL HIGH (ref 0.44–1.00)
GFR calc Af Amer: >60 mL/min (ref >60)
GLUCOSE: 95 mg/dL (ref 65–99)
POTASSIUM: 3.6 mmol/L (ref 3.5–5.1)
Sodium: 138 mmol/L (ref 135–145)

## 2017-03-29 LAB — URINALYSIS, ROUTINE W REFLEX MICROSCOPIC
Bilirubin Urine: NEGATIVE
Glucose, UA: NEGATIVE mg/dL
Ketones, ur: NEGATIVE mg/dL
LEUKOCYTES UA: NEGATIVE
NITRITE: NEGATIVE
PROTEIN: NEGATIVE mg/dL
SPECIFIC GRAVITY, URINE: 1.02 (ref 1.005–1.030)
pH: 6 (ref 5.0–8.0)

## 2017-03-29 LAB — TROPONIN I
TROPONIN I: 0.05 ng/mL — AB (ref <0.03)
TROPONIN I: 0.05 ng/mL — AB (ref <0.03)
Troponin I: 0.05 ng/mL (ref <0.03)
Troponin I: 0.05 ng/mL (ref <0.03)

## 2017-03-29 MED ORDER — ASPIRIN 81 MG PO CHEW
324.0000 mg | CHEWABLE_TABLET | Freq: Once | ORAL | Status: AC
  Administered 2017-03-29: 324 mg via ORAL
  Filled 2017-03-29: qty 4

## 2017-03-29 MED ORDER — ACETAMINOPHEN 325 MG PO TABS
650.0000 mg | ORAL_TABLET | Freq: Four times a day (QID) | ORAL | Status: DC | PRN
  Administered 2017-03-29: 650 mg via ORAL
  Filled 2017-03-29: qty 2

## 2017-03-29 MED ORDER — HYDROCODONE-ACETAMINOPHEN 5-325 MG PO TABS
1.0000 | ORAL_TABLET | ORAL | Status: DC | PRN
  Administered 2017-03-29: 2 via ORAL
  Administered 2017-03-30: 1 via ORAL
  Filled 2017-03-29: qty 1
  Filled 2017-03-29: qty 2

## 2017-03-29 MED ORDER — ONDANSETRON HCL 4 MG PO TABS: 4.0000 mg | ORAL_TABLET | Freq: Four times a day (QID) | ORAL | Status: DC | PRN

## 2017-03-29 MED ORDER — SODIUM CHLORIDE 0.9 % IV SOLN: 250.0000 mL | INTRAVENOUS | Status: DC | PRN

## 2017-03-29 MED ORDER — AMLODIPINE BESYLATE 5 MG PO TABS
5.0000 mg | ORAL_TABLET | Freq: Every day | ORAL | Status: DC
  Administered 2017-03-29 – 2017-03-30 (×2): 5 mg via ORAL
  Filled 2017-03-29 (×2): qty 1

## 2017-03-29 MED ORDER — CLONIDINE HCL 0.1 MG PO TABS: 0.1000 mg | ORAL_TABLET | ORAL | Status: DC | PRN

## 2017-03-29 MED ORDER — METOPROLOL TARTRATE 50 MG PO TABS
25.0000 mg | ORAL_TABLET | Freq: Once | ORAL | Status: AC
  Administered 2017-03-29: 25 mg via ORAL
  Filled 2017-03-29: qty 1

## 2017-03-29 MED ORDER — ONDANSETRON HCL 4 MG/2ML IJ SOLN
4.0000 mg | Freq: Four times a day (QID) | INTRAMUSCULAR | Status: DC | PRN
  Administered 2017-03-30: 4 mg via INTRAVENOUS
  Filled 2017-03-29: qty 2

## 2017-03-29 MED ORDER — SODIUM CHLORIDE 0.9% FLUSH: 3.0000 mL | INTRAVENOUS | Status: DC | PRN

## 2017-03-29 MED ORDER — SODIUM CHLORIDE 0.9% FLUSH
3.0000 mL | Freq: Two times a day (BID) | INTRAVENOUS | Status: DC
  Administered 2017-03-29 – 2017-03-30 (×2): 3 mL via INTRAVENOUS

## 2017-03-29 MED ORDER — ACETAMINOPHEN 650 MG RE SUPP: 650.0000 mg | Freq: Four times a day (QID) | RECTAL | Status: DC | PRN

## 2017-03-29 NOTE — ED Notes (Signed)
[facility] unavailable; this RN contact info provided.

## 2017-03-29 NOTE — ED Provider Notes (Signed)
Emergency Department Provider Note   I have reviewed the triage vital signs and the nursing notes.   HISTORY  Chief Complaint Hypertension   HPI Cristina Adkins is a 37 y.o. female with PMH of HTN but off meds for the last 2-3 years presents to the ED with asymptomatic HTN. She was at [employer] yesterday and took her BP which showed it was 245/166. She was asymptomatic at the time. She notes recent transient episode of sharp chest pain that was worse with exertion that occurred 2 days prior and has not returned. No SOB. Patient also with intermittent HA symptoms worse over the last several week. No sudden onset, maximal intensity symptoms. Patient has been on Amlodipine and Clonidine in the past with no improvement in symptoms so she stopped taking the meds.    Past Medical History:  Diagnosis Date  . Hypertension     Patient Active Problem List   Diagnosis Date Noted  . Hypertensive urgency 03/29/2017  . Elevated troponin 03/29/2017  . Cardiomegaly 03/29/2017  . Tobacco abuse 03/29/2017  . Anemia 03/29/2017    History reviewed. No pertinent surgical history.    Allergies Latex and Other  Family History  Problem Relation Age of Onset  . Hypertension Mother     Social History Social History   Tobacco Use  . Smoking status: Current Every Day Smoker    Packs/day: 1.00    Years: 9.00    Pack years: 9.00  . Smokeless tobacco: Never Used  Substance Use Topics  . Alcohol use: No  . Drug use: No    Review of Systems  Constitutional: No fever/chills Eyes: No visual changes. ENT: No sore throat. Cardiovascular: Positive chest pain with exertion and elevated BP.  Respiratory: Denies shortness of breath. Gastrointestinal: No abdominal pain.  No nausea, no vomiting.  No diarrhea.  No constipation. Genitourinary: Negative for dysuria. Musculoskeletal: Negative for back pain. Skin: Negative for rash. Neurological: Negative for focal weakness or numbness. Positive  intermittent HA.   10-point ROS otherwise negative.  ____________________________________________   PHYSICAL EXAM:  VITAL SIGNS: ED Triage Vitals  Enc Vitals Group     BP 03/29/17 1202 (!) 206/137     Pulse Rate 03/29/17 1202 (!) 106     Resp 03/29/17 1202 20     Temp 03/29/17 1202 98.2 F (36.8 C)     Temp Source 03/29/17 1202 Oral     SpO2 03/29/17 1202 100 %     Weight 03/29/17 1158 280 lb (127 kg)     Height 03/29/17 1158 5\' 1"  (1.549 m)   Constitutional: Alert and oriented. Well appearing and in no acute distress. Eyes: Conjunctivae are normal. PERRL.  Head: Atraumatic. Nose: No congestion/rhinnorhea. Mouth/Throat: Mucous membranes are moist.  Neck: No stridor.  Cardiovascular: Normal rate, regular rhythm. Good peripheral circulation. Grossly normal heart sounds.   Respiratory: Normal respiratory effort.  No retractions. Lungs CTAB. Gastrointestinal: Soft and nontender. No distention.  Musculoskeletal: No lower extremity tenderness nor edema. No gross deformities of extremities. Neurologic:  Normal speech and language. No gross focal neurologic deficits are appreciated.  Skin:  Skin is warm, dry and intact. No rash noted.  ____________________________________________   LABS (all labs ordered are listed, but only abnormal results are displayed)  Labs Reviewed  BASIC METABOLIC PANEL - Abnormal; Notable for the following components:      Result Value   Creatinine, Ser 1.09 (*)    All other components within normal limits  CBC WITH DIFFERENTIAL/PLATELET -  Abnormal; Notable for the following components:   WBC 11.5 (*)    Hemoglobin 9.9 (*)    HCT 32.3 (*)    MCV 65.5 (*)    MCH 20.1 (*)    RDW 23.6 (*)    Platelets 519 (*)    Neutro Abs 8.3 (*)    All other components within normal limits  TROPONIN I - Abnormal; Notable for the following components:   Troponin I 0.05 (*)    All other components within normal limits  URINALYSIS, ROUTINE W REFLEX MICROSCOPIC -  Abnormal; Notable for the following components:   Hgb urine dipstick LARGE (*)    All other components within normal limits  URINALYSIS, MICROSCOPIC (REFLEX) - Abnormal; Notable for the following components:   Bacteria, UA RARE (*)    Squamous Epithelial / LPF 0-5 (*)    All other components within normal limits  TROPONIN I - Abnormal; Notable for the following components:   Troponin I 0.05 (*)    All other components within normal limits  TROPONIN I  TROPONIN I  HIV ANTIBODY (ROUTINE TESTING)  MAGNESIUM  PHOSPHORUS  TSH  COMPREHENSIVE METABOLIC PANEL  CBC  HEMOGLOBIN A1C  LIPID PANEL  VITAMIN B12  FOLATE  IRON AND TIBC  FERRITIN  RETICULOCYTES   ____________________________________________  EKG   EKG Interpretation  Date/Time:  Monday March 29 2017 13:48:14 EST Ventricular Rate:  82 PR Interval:    QRS Duration: 85 QT Interval:  389 QTC Calculation: 455 R Axis:   22 Text Interpretation:  Sinus rhythm LVH by voltage Abnormal T, consider ischemia, lateral leads No STEMI.  Confirmed by Alona Bene [phone]) on 03/29/2017 2:58:16 PM       ____________________________________________  RADIOLOGY  Dg Chest 2 View  Result Date: 03/29/2017 CLINICAL DATA:  Marked hypertension, exertional chest pain previous history of hypertension which had resolved. Current smoker. EXAM: CHEST  2 VIEW COMPARISON:  Report of a chest x-ray of July 03, 2009 FINDINGS: The lungs are adequately inflated and clear. The heart is mildly enlarged. The pulmonary vascularity is normal. The mediastinum is normal in width. The trachea is midline. There is mild tortuosity of the descending thoracic aorta. The bony thorax exhibits no acute abnormality. IMPRESSION: There is no pneumonia nor other acute cardiopulmonary abnormality. Cardiomegaly without pulmonary edema. Electronically Signed   By: David  [place] M.D.   On: 03/29/2017 16:11      ____________________________________________   PROCEDURES  Procedure(s) performed:   .Critical Care  Performed by: Maia Plan, MD  Authorized by: Maia Plan, MD   Critical care provider statement:    Critical care time (minutes):  35   Critical care time was exclusive of:  Separately billable procedures and treating other patients and teaching time   Critical care was necessary to treat or prevent imminent or life-threatening deterioration of the following conditions:  Circulatory failure and cardiac failure   Critical care was time spent personally by me on the following activities:  Blood draw for specimens, development of treatment plan with patient or surrogate, evaluation of patient's response to treatment, examination of patient, obtaining history from patient or surrogate, ordering and performing treatments and interventions, ordering and review of laboratory studies, ordering and review of radiographic studies, pulse oximetry, re-evaluation of patient's condition and review of old charts   I assumed direction of critical care for this patient from another provider in my specialty: no       ____________________________________________   INITIAL IMPRESSION /  ASSESSMENT AND PLAN / ED COURSE  Pertinent labs & imaging results that were available during my care of the patient were reviewed by me and considered in my medical decision making (see chart for details).  She presents to the emergency department with mostly asymptomatic HTN but does describe some chest pain with exertion only along with intermittent headaches.  She has a normal neurological exam.  No active chest pain in the emergency department.  No chest pain at rest.  No dyspnea.  No evidence of volume overload.  No clear endorgan damage on initial exam.  Given the degree of hypertension and exertional chest pain plan for EKG, labs, chest x-ray and reassess  02:22 PM She was slight elevation in troponin  (0.05).  No active chest pain.  EKG reviewed with no acute abnormalities.  Unclear if this is ongoing troponin leak versus hypertensive emergency versus NSTEMI.  Patient is describing some exertional chest discomfort. Will discuss with the hospitalist regarding admission. BP is down-trending in the ED without medication being given. Continue to monitor and trend troponin.   Discussed patient's case with Hospitalist, Dr. Nelson Chimes to request admission. Patient and family (if present) updated with plan. Care transferred to Hospitalist service.  I reviewed all nursing notes, vitals, pertinent old records, EKGs, labs, imaging (as available).   ____________________________________________  FINAL CLINICAL IMPRESSION(S) / ED DIAGNOSES  Final diagnoses:  Precordial chest pain  Hypertensive urgency    Note:  This document was prepared using Dragon voice recognition software and may include unintentional dictation errors.  Alona Bene, MD Emergency Medicine   Giavanni Zeitlin, Arlyss Repress, MD 03/29/17 [phone]

## 2017-03-29 NOTE — H&P (Signed)
Cristina Adkins ZOX:[id number] DOB: 03/29/1980 DOA: 03/29/2017     PCP: Virl Son., MD   Outpatient Specialists: none Patient coming from:  home Lives  With family    Chief Complaint: nosebleed  HPI: Cristina Adkins is a 37 y.o. female with medical history significant of severe poorly controlled hypertension diagnosed since teenager, morbid obesity BMI 50    Presented with chest pain and nosebleeds.  Patient checked her blood pressure yesterday it was 245/166. 2 days ago she did have an episode of sharp chest pain which seemed to be worse with exertion.  Reports chest pain was occurring yesterday while she was moving furniture.  Reports associated shortness of breath she has been having intermittent headaches progressive over the past several weeks. Today she developed nosebleeds and presented to [place].  Patient has had history of hypertension for many years attempted different medications in the past.   Patient reports frequent headaches especially when she wakes up started in the back of her head and progress upwards.  She takes [facility] Powders with good relief. 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  Otherwise no unstable gait or other neurological complaints.  She endorses heavy menstrual bleeding for the past few months and pica. States in the past she has been told that she may have early cervical cancer but has not followed up. Patient at this point having a menstrual period and would like to hold off on pelvic ultrasound and ravel follow-up with OB/GYN regarding heavy menstrual periods.  Family does endorse that patient snores.  Per records clonidine has made her fatigued in the past.  2 years ago she was on Norvasc 5 mg a day then she was switched to Hyzaar 50/12.5 currently she is not taking any antihypertensive medications for the past 2 years  and so    While in ER: On arrival blood pressure 206/137  Significant initial  Findings: Troponin was noted to be elevated at 0.05  WBC 11.5 hemoglobin 9.9 Na 138 K3.6  CXR -cardiomegaly IN ER:  Temp (24hrs), Avg:98.3 F (36.8 C), Min:98.2 F (36.8 C), Max:98.3 F (36.8 C)      on arrival  ED Triage Vitals  Enc Vitals Group     BP 03/29/17 1202 (!) 206/137     Pulse Rate 03/29/17 1202 (!) 106     Resp 03/29/17 1202 20     Temp 03/29/17 1202 98.2 F (36.8 C)     Temp Source 03/29/17 1202 Oral     SpO2 03/29/17 1202 100 %     Weight 03/29/17 1158 280 lb (127 kg)     Height 03/29/17 1158 5' 1"  (1.549 m)     Head Circumference --      Peak Flow --      Pain Score 03/29/17 1822 4     Pain Loc --      Pain Edu? --      Excl. in [place]? --     Latest RR 24 setting 100% on room air BP 140/110 HR 60  Following Medications were ordered in ER: Medications  metoprolol tartrate (LOPRESSOR) tablet 25 mg (25 mg Oral Given 03/29/17 1628)  aspirin chewable tablet 324 mg (324 mg Oral Given 03/29/17 1627)      Hospitalist was called for admission for hypertensive urgency elevated troponin in a setting of poorly controlled hypertension  Review of Systems:    Pertinent positives include:  chest pain, headaches left hand numbness headaches,dyspnea on exertion,  Bilateral lower extremity swelling   Constitutional:  No weight loss, night sweats, Fevers, chills, fatigue, weight loss  HEENT:  No  Difficulty swallowing,Tooth/dental problems,Sore throat,  No sneezing, itching, ear ache, nasal congestion, post nasal drip,  Cardio-vascular:  No Orthopnea, PND, anasarca, dizziness, palpitations. GI:  No heartburn, indigestion, abdominal pain, nausea, vomiting, diarrhea, change in bowel habits, loss of appetite, melena, blood in stool, hematemesis Resp:  no shortness of breath at rest.   No excess mucus, no productive cough, No non-productive cough, No coughing up of blood.No change in  color of mucus.No wheezing. Skin:  no rash or lesions. No jaundice GU:  no dysuria, change in color of urine, no urgency or frequency. No straining to urinate.  No flank pain.  Musculoskeletal:  No joint pain or no joint swelling. No decreased range of motion. No back pain.  Psych:  No change in mood or affect. No depression or anxiety. No memory loss.  Neuro: no localizing neurological complaints, no tingling, no weakness, no double vision, no gait abnormality, no slurred speech, no confusion  As per HPI otherwise 10 point review of systems negative.   Past Medical History: Past Medical History:  Diagnosis Date  . Hypertension    History reviewed. No pertinent surgical history.   Social History:  Ambulatory  independently     reports that she has been smoking.  She has a 9.00 pack-year smoking history. she has never used smokeless tobacco. She reports that she does not drink alcohol or use drugs.  Allergies:   Allergies  Allergen Reactions  . Latex     Irritated and burning skin  . Other     Allergic to all fruit and raw tomatoes reaction face swells       Family History:   Family History  Problem Relation Age of Onset  . Hypertension Mother     Medications: Prior to Admission medications   Medication Sig Start Date End Date Taking? Authorizing Provider  amLODipine (NORVASC) 5 MG tablet Take 1 tablet (5 mg total) by mouth daily. 07/11/15   Wardell Honour, MD  Blood Pressure Monitoring (ADULT BLOOD PRESSURE CUFF LG) KIT 1 Units by Does not apply route daily. 07/11/14   Elby Beck, FNP  ferrous sulfate 325 (65 FE) MG tablet Take 1 tablet (325 mg total) by mouth 2 (two) times daily with a meal. 07/16/14   Elby Beck, FNP    Physical Exam: Patient Vitals for the past 24 hrs:  BP Temp Temp src Pulse Resp SpO2 Height Weight  03/29/17 1822 (!) 188/120 98.3 F (36.8 C) Oral 75 20 100 % - -  03/29/17 1730 (!) 140/110 - - 68 (!) 24 100 % - -  03/29/17  1600 (!) 164/114 - - 86 (!) 23 100 % - -  03/29/17 1500 (!) 168/129 - - 88 (!) 26 99 % - -  03/29/17 1400 (!) 154/119 - - 89 (!) 23 100 % - -  03/29/17 1350 (!) 178/127 - - 87 20 100 % - -  03/29/17 1202 (!) 206/137 98.2 F (36.8 C) Oral (!) 106 20 100 % - -  03/29/17 1158 - - - - - - 5' 1"  (1.549 m) 127 kg (280 lb)    1. General:  in No Acute distress  well -appearing 2. Psychological: Alert and   Oriented 3. Head/ENT:  Moist  Mucous Membranes                          Head Non traumatic, neck supple                          Normal   Dentition 4. SKIN: normal  Skin turgor,  Skin clean Dry and intact no rash 5. Heart: Distant regular rate and rhythm no  Murmur, no Rub or gallop 6. Lungs: Distant no wheezes or crackles   7. Abdomen: Soft non-tender, Non distended  obese  bowel sounds present 8. Lower extremities: no clubbing, cyanosis, or edema 9. Neurologically strength 5 out of 5 in all 4 extremities cranial nerves appear to be intact 10. MSK: Normal range of motion   body mass index is 52.91 kg/m.  Labs on Admission:   Labs on Admission: I have personally reviewed following labs and imaging studies  CBC: Recent Labs  Lab 03/29/17 1242  WBC 11.5*  NEUTROABS 8.3*  HGB 9.9*  HCT 32.3*  MCV 65.5*  PLT 967*   Basic Metabolic Panel: Recent Labs  Lab 03/29/17 1242  NA 138  K 3.6  CL 104  CO2 26  GLUCOSE 95  BUN 19  CREATININE 1.09*  CALCIUM 8.9   GFR: Estimated Creatinine Clearance: 90.4 mL/min (A) (by C-G formula based on SCr of 1.09 mg/dL (H)). Liver Function Tests: No results for input(s): AST, ALT, ALKPHOS, BILITOT, PROT, ALBUMIN in the last 168 hours. No results for input(s): LIPASE, AMYLASE in the last 168 hours. No results for input(s): AMMONIA in the last 168 hours. Coagulation Profile: No results for input(s): INR, PROTIME in the last 168 hours. Cardiac Enzymes: Recent Labs  Lab 03/29/17 1242 03/29/17 1613  TROPONINI 0.05* 0.05*   BNP (last 3  results) No results for input(s): PROBNP in the last 8760 hours. HbA1C: No results for input(s): HGBA1C in the last 72 hours. CBG: No results for input(s): GLUCAP in the last 168 hours. Lipid Profile: No results for input(s): CHOL, HDL, LDLCALC, TRIG, CHOLHDL, LDLDIRECT in the last 72 hours. Thyroid Function Tests: No results for input(s): TSH, T4TOTAL, FREET4, T3FREE, THYROIDAB in the last 72 hours. Anemia Panel: No results for input(s): VITAMINB12, FOLATE, FERRITIN, TIBC, IRON, RETICCTPCT in the last 72 hours. Urine analysis:    Component Value Date/Time   COLORURINE YELLOW 03/29/2017 [place] 03/29/2017 1457   LABSPEC 1.020 03/29/2017 1457   PHURINE 6.0 03/29/2017 1457   GLUCOSEU NEGATIVE 03/29/2017 1457   HGBUR Adkins (A) 03/29/2017 1457   BILIRUBINUR NEGATIVE 03/29/2017 1457   KETONESUR NEGATIVE 03/29/2017 1457   PROTEINUR NEGATIVE 03/29/2017 1457   UROBILINOGEN 0.2 10/04/2010 1613   NITRITE NEGATIVE 03/29/2017 1457   LEUKOCYTESUR NEGATIVE 03/29/2017 1457   Sepsis Labs: @LABRCNTIP (procalcitonin:4,lacticidven:4) )No results found for this or any previous visit (from the past 240 hour(s)).    UA   no evidence of UTI hemoglobin and urine  Lab Results  Component Value Date   HGBA1C 5.6 07/11/2014    Estimated Creatinine Clearance: 90.4 mL/min (A) (by C-G formula based on SCr of 1.09 mg/dL (H)).  BNP (last 3 results) No results for input(s): PROBNP in the last 8760 hours.   ECG REPORT  Independently reviewed Rate: 82  Rhythm: Sinus rhythm ST&T Change: Abnormal T waves in the lateral leads LVH QTC 445  Filed Weights   03/29/17 1158  Weight: 127 kg (  280 lb)     Cultures:    Component Value Date/Time   SDES URINE, RANDOM 05/13/2010 1059   SPECREQUEST NONE 05/13/2010 1059   CULT INSIGNIFICANT GROWTH 05/13/2010 1059   REPTSTATUS 05/14/2010 FINAL 05/13/2010 1059     Radiological Exams on Admission: Dg Chest 2 View  Result Date:  03/29/2017 CLINICAL DATA:  Marked hypertension, exertional chest pain previous history of hypertension which had resolved. Current smoker. EXAM: CHEST  2 VIEW COMPARISON:  Report of a chest x-ray of July 03, 2009 FINDINGS: The lungs are adequately inflated and clear. The heart is mildly enlarged. The pulmonary vascularity is normal. The mediastinum is normal in width. The trachea is midline. There is mild tortuosity of the descending thoracic aorta. The bony thorax exhibits no acute abnormality. IMPRESSION: There is no pneumonia nor other acute cardiopulmonary abnormality. Cardiomegaly without pulmonary edema. Electronically Signed   By: David  Martinique M.D.   On: 03/29/2017 16:11    Chart has been reviewed    Assessment/Plan   37 y.o. female with medical history significant of severe poorly controlled hypertension diagnosed since teenager, morbid obesity BMI 50  Admitted for hypertensive urgency, new found cardiomegaly and elevated troponin  Present on Admission: . Hypertensive urgency -blood pressure currently improving patient is willing to restart Norvasc and see if she had some improvement recommended close follow-up with primary care provider . Elevated troponin most likely secondary to hypertensive urgency continue to cycle order echogram to evaluate spoke about importance of blood pressure management . Cardiomegaly we will order echogram emailed to cardiology in a.m. . Tobacco abuse spoke about importance of quitting. . Anemia suspect iron deficiency anemia secondary to heavy menstrual bleeding.  Patient states she would like to follow-up with OB/GYN as an outpatient Headaches -suspect secondary to uncontrolled hypertension versus possible sleep apnea.  Improved with over-the-counter pain medications.  Isolated subjective left hand numbness most suggestive of peripheral issue such as carpal tunnel syndrome otherwise neurologically intact. Given risk factors will obtain CT of the head given the  progressive nature of her headache  History of possible cervical cancer advised patient to follow-up with OB/GYN Other plan as per orders.  DVT prophylaxis:  SCD    Code Status:  FULL CODE   as per patient    Family Communication:   Family   at  Bedside  plan of care was discussed with   Daughter mother  Disposition Plan:     To home once workup is complete and patient is stable                     Consults called: email cardiology   Admission status:    inpatient      Level of care   tele             I have spent a total of 56 min on this admission   Ingvald Theisen 03/29/2017, 8:43 PM    [facility]  Pager [phone]   after 2 AM please page floor coverage PA If 7AM-7PM, please contact the day team taking care of the patient  [email or web address]  Password [email or web address]

## 2017-03-29 NOTE — ED Triage Notes (Addendum)
Patient reports she checked BP at [facility] yesterday and states BP was 245/166.  Denies headaches, chest pain, shortness of breath.  States she has hx of HTN but was taken off BP medication due to BP under control.  Reports stopped taking BP medication 2-3 years ago.

## 2017-03-29 NOTE — ED Notes (Signed)
Report to Katie, RN at [facility]. 

## 2017-03-29 NOTE — ED Notes (Signed)
Pt on monitor 

## 2017-03-29 NOTE — Progress Notes (Signed)
Pt arrived on unit at 1830.  Telemetry placed, vital signs taken.  MD made aware of patient arrival.  Awaiting orders.

## 2017-03-29 NOTE — Progress Notes (Signed)
37 year old female with significant history of uncontrolled hypertension.  She has been off her antihypertensive agents for the last 2 to 3 years.  Blood pressure was 230/160 at home yesterday.  Today she developed exertional chest pain.  Her initial physical examination in the emergency department, blood pressure 206/137, heart rate 106, respiratory rate 20, oxygen saturation 100%, 98.2 F.  No clinical signs of volume overload.   Serum creatinine 1.09, Troponin 0.05, wbc 11,5 and hb 9.9.  EKG: sinus 82 bpm, normal axis, T wave inversion in I and AVL. No ST elevation or ST depression.  Positive left ventricle hypertrophy.   Patient has been accepted to telemetry, for blood pressure control and further cardiac workup.

## 2017-03-30 ENCOUNTER — Inpatient Hospital Stay (HOSPITAL_COMMUNITY): Payer: 59

## 2017-03-30 DIAGNOSIS — I160 Hypertensive urgency: Secondary | ICD-10-CM | POA: Diagnosis not present

## 2017-03-30 DIAGNOSIS — I517 Cardiomegaly: Secondary | ICD-10-CM | POA: Diagnosis not present

## 2017-03-30 DIAGNOSIS — Z720 Tobacco use: Secondary | ICD-10-CM | POA: Diagnosis not present

## 2017-03-30 DIAGNOSIS — I10 Essential (primary) hypertension: Secondary | ICD-10-CM

## 2017-03-30 DIAGNOSIS — R748 Abnormal levels of other serum enzymes: Secondary | ICD-10-CM | POA: Diagnosis not present

## 2017-03-30 DIAGNOSIS — D649 Anemia, unspecified: Secondary | ICD-10-CM | POA: Diagnosis not present

## 2017-03-30 DIAGNOSIS — I119 Hypertensive heart disease without heart failure: Secondary | ICD-10-CM | POA: Diagnosis not present

## 2017-03-30 LAB — LIPID PANEL
CHOL/HDL RATIO: 2.1 ratio
Cholesterol: 68 mg/dL (ref 0–200)
HDL: 33 mg/dL — AB (ref >40)
LDL Cholesterol: 16 mg/dL (ref 0–99)
Triglycerides: 94 mg/dL (ref <150)
VLDL: 19 mg/dL (ref 0–40)

## 2017-03-30 LAB — RETICULOCYTES
RBC.: 4.85 MIL/uL (ref 3.87–5.11)
RETIC CT PCT: 1.9 % (ref 0.4–3.1)
Retic Count, Absolute: 92.2 10*3/uL (ref 19.0–186.0)

## 2017-03-30 LAB — COMPREHENSIVE METABOLIC PANEL
ALBUMIN: 3.2 g/dL — AB (ref 3.5–5.0)
ALK PHOS: 59 U/L (ref 38–126)
ALT: 16 U/L (ref 14–54)
ANION GAP: 10 (ref 5–15)
AST: 23 U/L (ref 15–41)
BUN: 19 mg/dL (ref 6–20)
CALCIUM: 8.7 mg/dL — AB (ref 8.9–10.3)
CO2: 23 mmol/L (ref 22–32)
CREATININE: 1.04 mg/dL — AB (ref 0.44–1.00)
Chloride: 105 mmol/L (ref 101–111)
GFR calc Af Amer: >60 mL/min (ref >60)
GFR calc non Af Amer: >60 mL/min (ref >60)
GLUCOSE: 81 mg/dL (ref 65–99)
Potassium: 4.1 mmol/L (ref 3.5–5.1)
Sodium: 138 mmol/L (ref 135–145)
Total Bilirubin: 0.3 mg/dL (ref 0.3–1.2)
Total Protein: 7.1 g/dL (ref 6.5–8.1)

## 2017-03-30 LAB — VITAMIN B12: VITAMIN B 12: 706 pg/mL (ref 180–914)

## 2017-03-30 LAB — ECHOCARDIOGRAM COMPLETE
Height: 61 in
Weight: 4480 oz

## 2017-03-30 LAB — CBC
HCT: 33.4 % — ABNORMAL LOW (ref 36.0–46.0)
HEMOGLOBIN: 9.6 g/dL — AB (ref 12.0–15.0)
MCH: 19.8 pg — AB (ref 26.0–34.0)
MCHC: 28.7 g/dL — AB (ref 30.0–36.0)
MCV: 68.9 fL — ABNORMAL LOW (ref 78.0–100.0)
Platelets: 441 10*3/uL — ABNORMAL HIGH (ref 150–400)
RBC: 4.85 MIL/uL (ref 3.87–5.11)
RDW: 22.3 % — AB (ref 11.5–15.5)
WBC: 10.6 10*3/uL — ABNORMAL HIGH (ref 4.0–10.5)

## 2017-03-30 LAB — HEMOGLOBIN A1C
Hgb A1c MFr Bld: 5.3 % (ref 4.8–5.6)
Mean Plasma Glucose: 105.41 mg/dL

## 2017-03-30 LAB — MAGNESIUM: Magnesium: 2.1 mg/dL (ref 1.7–2.4)

## 2017-03-30 LAB — IRON AND TIBC
Iron: 20 ug/dL — ABNORMAL LOW (ref 28–170)
SATURATION RATIOS: 5 % — AB (ref 10.4–31.8)
TIBC: 388 ug/dL (ref 250–450)
UIBC: 368 ug/dL

## 2017-03-30 LAB — TSH: TSH: 3.847 u[IU]/mL (ref 0.350–4.500)

## 2017-03-30 LAB — FERRITIN: Ferritin: 6 ng/mL — ABNORMAL LOW (ref 11–307)

## 2017-03-30 LAB — FOLATE: Folate: 14.9 ng/mL (ref >5.9)

## 2017-03-30 LAB — PHOSPHORUS: Phosphorus: 4.9 mg/dL — ABNORMAL HIGH (ref 2.5–4.6)

## 2017-03-30 LAB — HCG, QUANTITATIVE, PREGNANCY

## 2017-03-30 MED ORDER — BUTALBITAL-APAP-CAFFEINE 50-325-40 MG PO TABS: 1.0000 | ORAL_TABLET | Freq: Four times a day (QID) | ORAL | 0 refills | Status: DC | PRN

## 2017-03-30 MED ORDER — ACETAMINOPHEN 650 MG RE SUPP: 650.0000 mg | Freq: Four times a day (QID) | RECTAL | Status: DC | PRN

## 2017-03-30 MED ORDER — AMLODIPINE BESYLATE 5 MG PO TABS: 5.0000 mg | ORAL_TABLET | Freq: Every day | ORAL | 0 refills | Status: DC

## 2017-03-30 MED ORDER — HYDROCHLOROTHIAZIDE 25 MG PO TABS: 25.0000 mg | ORAL_TABLET | Freq: Every day | ORAL | 0 refills | Status: DC

## 2017-03-30 MED ORDER — FERROUS SULFATE 325 (65 FE) MG PO TABS
325.0000 mg | ORAL_TABLET | Freq: Three times a day (TID) | ORAL | Status: DC
  Administered 2017-03-30 (×2): 325 mg via ORAL
  Filled 2017-03-30 (×2): qty 1

## 2017-03-30 MED ORDER — HYDROCHLOROTHIAZIDE 25 MG PO TABS
25.0000 mg | ORAL_TABLET | Freq: Every day | ORAL | Status: DC
  Administered 2017-03-30: 25 mg via ORAL
  Filled 2017-03-30: qty 1

## 2017-03-30 MED ORDER — ACETAMINOPHEN 325 MG PO TABS
650.0000 mg | ORAL_TABLET | Freq: Four times a day (QID) | ORAL | Status: DC | PRN
  Administered 2017-03-30: 650 mg via ORAL
  Filled 2017-03-30: qty 2

## 2017-03-30 MED ORDER — ISOSORBIDE MONONITRATE ER 30 MG PO TB24
15.0000 mg | ORAL_TABLET | Freq: Every day | ORAL | Status: DC
  Administered 2017-03-30: 15 mg via ORAL
  Filled 2017-03-30: qty 1

## 2017-03-30 MED ORDER — ACETAMINOPHEN 325 MG PO TABS: 650.0000 mg | ORAL_TABLET | Freq: Four times a day (QID) | ORAL | Status: DC | PRN

## 2017-03-30 MED ORDER — BUTALBITAL-APAP-CAFFEINE 50-325-40 MG PO TABS: 1.0000 | ORAL_TABLET | Freq: Four times a day (QID) | ORAL | Status: DC | PRN

## 2017-03-30 MED ORDER — FERROUS SULFATE 325 (65 FE) MG PO TABS: 325.0000 mg | ORAL_TABLET | Freq: Three times a day (TID) | ORAL | 0 refills | Status: DC

## 2017-03-30 NOTE — Progress Notes (Signed)
Patient is stable for discharge. Discharge instructions and medications have been reviewed with the patient and all questions answered. AVS and prescriptions given to patient.  

## 2017-03-30 NOTE — Discharge Instructions (Signed)

## 2017-03-30 NOTE — Consult Note (Signed)
Cardiology Consultation:   Patient ID: Cristina Adkins; [id number]; 02/26/1980   Admit date: 03/29/2017 Date of Consult: 03/30/2017  Primary Care Provider: Virl Son., MD Primary Cardiologist: New  Patient Profile:   Cristina Adkins is a 37 y.o. female with a hx of poorly controlled HTN (dx as teenager, not currently followed by PCP) who is being seen today for the evaluation of chest pain and elevated blood pressures at the request of Dr. Roel Cluck.  History of Present Illness:   Cristina Adkins is a 37yo F with a PMH of uncontrolled HTN for with which she has been treated with clonidine (did not tolerate) and Norvasc in the past, however she is not currently followed by her PCP and reports that she has no longer on medication. She reports initially being diagnosed while in high school and followed intermittently by her PCP provider. She has no prior history of CAD.  On 03/27/2017 Cristina Adkins reports experiencing new onset, non-radiating anterior chest pressure/discomfort rated a 9/10 in severity while moving furniture at her home. She states that the chest pressure lasted several minutes in duration and subsequently was relieved with rest. Later that day, she took her blood pressure which was 245/166.  She states that she intermittently checks her blood pressure at home, at her moms house, and in stores. She states that her systolic blood pressure usually ranges in the 170-180's.  Over the last month, she states she has been having  progressively worsening shortness of however denies previous episodes of chest pressure. She also states that she has had intermittent fluttering in her chest over the past year which usually while at rest and subside on their own.  Along with her other symptoms, she has been having headaches that usually begin approximately 3:00 AM most mornings, waking her from her sleep. She has been taking BC powder with complete relief. She denies nausea, vomiting, diaphoresis however,  does have some long-term left hand numbness and tingling most likely related to carpal tunnel syndrome. Denies blood in her stool or urine.  On 03/29/17 she began having a nosebleed, which is unusual for her.  Knowing her history of uncontrolled hypertension and other recent symptoms including chest pain and headaches, she decided to proceed to the emergency room for further evaluation.   On 03/29/2017 she presented to [facility] and subsequently transported to [facility].  A series of troponin levels were drawn which have been mildly elevated at 0.05, 0.05, 0.05.  An EKG was performed which shows normal sinus rhythm and lateral T wave inversions in leads I and aVL. There is no comparison tracing available. There is an echocardiogram currently being performed with results pending.  A chest x-ray shows no acute cardiopulmonary abnormalities however, cardiomegaly is present.  Given her history of headaches, a head CT was performed which was unremarkable.  She is currently chest pain-free and has had no recurrent chest pressure/pain episode since 03/27/2017. Her blood pressures have been trending down, last reading 159/86. She is afebrile. Creatinine is mildly elevated at 1.04. TSH was normal at 3.847. She was given ASA 324 mg and restarted on amlodipine 5 mg along with hydrochlorothiazide 25 mg.  Past Medical History:  Diagnosis Date  . Hypertension     History reviewed. No pertinent surgical history.   Prior to Admission medications   Medication Sig Start Date End Date Taking? Authorizing Provider  aspirin 325 MG tablet Take 325 mg by mouth once.   Yes [provider]  Aspirin-Acetaminophen-Caffeine (GOODY HEADACHE PO) Take 1 each by mouth daily as needed (pain.).   Yes [provider]  amLODipine (NORVASC) 5 MG tablet Take 1 tablet (5 mg total) by mouth daily. 07/11/15   Wardell Honour, MD  Blood Pressure Monitoring (ADULT BLOOD PRESSURE CUFF LG) KIT 1 Units by  Does not apply route daily. Patient not taking: Reported on 03/29/2017 07/11/14   Elby Beck, FNP  ferrous sulfate 325 (65 FE) MG tablet Take 1 tablet (325 mg total) by mouth 2 (two) times daily with a meal. 07/16/14   Elby Beck, FNP    Inpatient Medications: Scheduled Meds: . amLODipine  5 mg Oral Daily  . hydrochlorothiazide  25 mg Oral Daily  . sodium chloride flush  3 mL Intravenous Q12H   Continuous Infusions: . sodium chloride     PRN Meds: sodium chloride, acetaminophen **OR** acetaminophen, ondansetron **OR** ondansetron (ZOFRAN) IV, sodium chloride flush  Allergies:    Allergies  Allergen Reactions  . Latex     Irritated and burning skin  . Other     Allergic to all fruit and raw tomatoes reaction face swells    Social History:   Social History   Socioeconomic History  . Marital status: Single    Spouse name: Not on file  . Number of children: Not on file  . Years of education: Not on file  . Highest education level: Not on file  Social Needs  . Financial resource strain: Not on file  . Food insecurity - worry: Not on file  . Food insecurity - inability: Not on file  . Transportation needs - medical: Not on file  . Transportation needs - non-medical: Not on file  Occupational History  . Not on file  Tobacco Use  . Smoking status: Current Every Day Smoker    Packs/day: 1.00    Years: 9.00    Pack years: 9.00  . Smokeless tobacco: Never Used  Substance and Sexual Activity  . Alcohol use: No  . Drug use: No  . Sexual activity: Yes    Birth control/protection: None  Other Topics Concern  . Not on file  Social History Narrative  . Not on file    Family History:   Family History  Problem Relation Age of Onset  . Hypertension Mother    Family Status:  Family Status  Relation Name Status  . Mother  (Not Specified)    ROS:  Please see the history of present illness.  All other ROS reviewed and negative.     Physical Exam/Data:    Vitals:   03/29/17 1730 03/29/17 1822 03/29/17 2106 03/30/17 0543  BP: (!) 140/110 (!) 188/120 (!) 177/94 (!) 159/86  Pulse: 68 75 78 70  Resp: (!) 24 20 16 16   Temp:  98.3 F (36.8 C) 97.8 F (36.6 C) (!) 97.3 F (36.3 C)  TempSrc:  Oral Oral Oral  SpO2: 100% 100% 99% 100%  Weight:      Height:       No intake or output data in the 24 hours ending 03/30/17 0740 Filed Weights   03/29/17 1158  Weight: 280 lb (127 kg)   Body mass index is 52.91 kg/m.   General: Well developed, well nourished, NAD Skin: Warm, dry, intact  Head: Normocephalic, atraumatic, clear, moist mucus membranes. Neck: Negative for carotid bruits. No JVD Lungs: Clear to ausculation bilaterally. No wheezes, rales, or rhonchi. Breathing is unlabored. Cardiovascular: RRR with S1 S2.  No murmurs, rubs, or gallops Abdomen: Soft, non-tender, non-distended with normoactive bowel sounds.No obvious abdominal masses. MSK: Strength and tone appear normal for age. 5/5 in all extremities Extremities: No edema. No clubbing or cyanosis. DP/PT pulses 2+ bilaterally Neuro: Alert and oriented. No focal deficits. No facial asymmetry. MAE spontaneously. Psych: Responds to questions appropriately with normal affect.      EKG:  The EKG was personally reviewed and demonstrates: 03/30/17 NSR with T-wave inversions in leads I, aVL. LVH present.  No comparable tracing available  Telemetry:  Telemetry was personally reviewed and demonstrates:  03/30/17 NSR HR 66  Relevant CV Studies:  ECHO: Pending results CATH: NA  Laboratory Data:  Chemistry Recent Labs  Lab 03/29/17 1242 03/30/17 0500  NA 138 138  K 3.6 4.1  CL 104 105  CO2 26 23  GLUCOSE 95 81  BUN 19 19  CREATININE 1.09* 1.04*  CALCIUM 8.9 8.7*  GFRNONAA >60 >60  GFRAA >60 >60  ANIONGAP 8 10    Total Protein  Date Value Ref Range Status  03/30/2017 7.1 6.5 - 8.1 g/dL Final   Albumin  Date Value Ref Range Status  03/30/2017 3.2 (L) 3.5 - 5.0 g/dL  Final   AST  Date Value Ref Range Status  03/30/2017 23 15 - 41 U/L Final   ALT  Date Value Ref Range Status  03/30/2017 16 14 - 54 U/L Final   Alkaline Phosphatase  Date Value Ref Range Status  03/30/2017 59 38 - 126 U/L Final   Total Bilirubin  Date Value Ref Range Status  03/30/2017 0.3 0.3 - 1.2 mg/dL Final   Hematology Recent Labs  Lab 03/29/17 1242 03/30/17 0500  WBC 11.5* 10.6*  RBC 4.93 4.85  4.85  HGB 9.9* 9.6*  HCT 32.3* 33.4*  MCV 65.5* 68.9*  MCH 20.1* 19.8*  MCHC 30.7 28.7*  RDW 23.6* 22.3*  PLT 519* 441*   Cardiac Enzymes Recent Labs  Lab 03/29/17 1242 03/29/17 1613 03/29/17 1859 03/29/17 2151  TROPONINI 0.05* 0.05* 0.05* 0.05*   No results for input(s): TROPIPOC in the last 168 hours.  BNPNo results for input(s): BNP, PROBNP in the last 168 hours.  DDimer No results for input(s): DDIMER in the last 168 hours. TSH:  Lab Results  Component Value Date   TSH 3.847 03/30/2017   Lipids: Lab Results  Component Value Date   CHOL 68 03/30/2017   HDL 33 (L) 03/30/2017   LDLCALC 16 03/30/2017   TRIG 94 03/30/2017   CHOLHDL 2.1 03/30/2017   HgbA1c: Lab Results  Component Value Date   HGBA1C 5.6 07/11/2014    Radiology/Studies:  Dg Chest 2 View  Result Date: 03/29/2017 CLINICAL DATA:  Marked hypertension, exertional chest pain previous history of hypertension which had resolved. Current smoker. EXAM: CHEST  2 VIEW COMPARISON:  Report of a chest x-ray of July 03, 2009 FINDINGS: The lungs are adequately inflated and clear. The heart is mildly enlarged. The pulmonary vascularity is normal. The mediastinum is normal in width. The trachea is midline. There is mild tortuosity of the descending thoracic aorta. The bony thorax exhibits no acute abnormality. IMPRESSION: There is no pneumonia nor other acute cardiopulmonary abnormality. Cardiomegaly without pulmonary edema. Electronically Signed   By: David  Martinique M.D.   On: 03/29/2017 16:11   Ct Head Wo  Contrast  Result Date: 03/29/2017 CLINICAL DATA:  Headache EXAM: CT HEAD WITHOUT CONTRAST TECHNIQUE: Contiguous axial images were obtained from the base of the skull through the vertex  without intravenous contrast. COMPARISON:  None. FINDINGS: Brain: No mass lesion, intraparenchymal hemorrhage or extra-axial collection. No evidence of acute cortical infarct. Normal appearance of the brain parenchyma and extra axial spaces for age. Vascular: No hyperdense vessel or unexpected vascular calcification. Skull: Normal visualized skull base, calvarium and extracranial soft tissues. Sinuses/Orbits: No sinus fluid levels or advanced mucosal thickening. No mastoid effusion. Normal orbits. IMPRESSION: Normal head CT. Electronically Signed   By: Ulyses Jarred M.D.   On: 03/29/2017 22:01    Assessment and Plan:   1. HTN urgency: -Remains elevated, 159/86, 177/94, 188/120 -BP upon admission 206/137 -Amlodipine 18m PO QD started  -HCTZ 258mPO QD -Metoprolol tartrate 257miven once in ED -We will need to make medication adjustments while in the hospital and followed closely as outpatient  2. Recent chest pain with elevated troponin: -Trop, 0.05, 0.05, 0.05 -EKG with T wave inversions in lateral leads, lead I, aVL, signs of LVH -Most likely secondary to HTN urgency which is now improved  -Denies recurrent chest pain this AM -Echocardiogram pending -ASA 324m58mven, BB given once in the ED -If recurrent episode of chest discomfort occurs, consider outpatient stress test for further ischemic evaluation, however would suspect that her symptoms are related to poorly controlled hypertension  3. Headaches: -Head CT unremarkable -Most likely related to long standing uncontrolled hypertension -Reports systolic blood pressures normally ranging in the 170s-180s -Discussed further options for headache relief besides BC powder, as this can further elevate her blood pressures  4. Tobacco use: -Smoking cessation  strongly encouraged    For questions or updates, please contact [facility] consult [email or web address] for contact info under Cardiology/STEMI.   Signed, JiKathyrn DrownC [facility] Pager: [phone] 7:40 AM

## 2017-03-30 NOTE — Progress Notes (Signed)
  Echocardiogram 2D Echocardiogram has been performed.  Cristina Adkins 03/30/2017, 8:55 AM

## 2017-03-31 LAB — HIV ANTIBODY (ROUTINE TESTING W REFLEX): HIV SCREEN 4TH GENERATION: NONREACTIVE

## 2017-04-02 NOTE — Discharge Summary (Signed)
[facility] Discharge Summary   Patient: Cristina Adkins GLO:[id number]   PCP: Virl Son., MD DOB: April 25, 1980   Date of admission: 03/29/2017   Date of discharge: 03/30/2017    Discharge Diagnoses:  Active Problems:   Hypertensive urgency   Elevated troponin   Cardiomegaly   Tobacco abuse   Anemia   Heavy menstrual bleeding   Admitted From: home Disposition:  home  Recommendations for Outpatient Follow-up:  1. Please follow up with PCP in 1 week   Follow-up Information    Virl Son., MD. Schedule an appointment as soon as possible for a visit in 1 week(s).   Specialty:  Family Medicine Why:  please get sleep study scheduled.  Contact information: [address]  [address] [place] [address] [phone]          Diet recommendation: cardiac diet  Activity: The patient is advised to gradually reintroduce usual activities.  Discharge Condition: good  Code Status: full code  History of present illness: As per the H and P dictated on admission, "Cristina Adkins is a 37 y.o. female with medical history significant of severe poorly controlled hypertension diagnosed since teenager, morbid obesity BMI 50    Presented with chest pain and nosebleeds.  Patient checked her blood pressure yesterday it was 245/166. 2 days ago she did have an episode of sharp chest pain which seemed to be worse with exertion.  Reports chest pain was occurring yesterday while she was moving furniture.  Reports associated shortness of breath she has been having intermittent headaches progressive over the past several weeks. Today she developed nosebleeds and presented to [place].  Patient has had history of hypertension for many years attempted different medications in the past.   Patient reports frequent headaches especially when she wakes up started in the back of her head and progress upwards.  She takes [facility] Powders with good relief. 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 Hypertensive urgency - blood pressure currently improving On norvasc and HCTZ, outpatient folow up with PCP recommended   . Elevated troponin most likely secondary to hypertensive urgency Cardiology consulted. Echocardiogram shows no WMA No further work up at present recommended Should the pt has angina symptoms further work up recommended   . Tobacco abuse spoke about importance of quitting.  . Anemia suspect iron deficiency anemia secondary to heavy menstrual bleeding.  Patient states she would like to follow-up with OB/GYN as an outpatient  Headaches -suspect secondary to uncontrolled hypertension with possible sleep apnea. CT head negative Recommended sleep study and blood pressure control.  All other chronic medical condition were stable during the hospitalization.  Patient was ambulatory without any assistance. On the day of the discharge the patient's vitals were stable, and no other acute medical condition were reported by patient. the patient was felt safe to be discharge at home with family.  Procedures and Results:  Echocardiogram   Consultations:  cardiology  DISCHARGE MEDICATION: Allergies as of 03/30/2017      Reactions   Latex    Irritated and burning skin   Other    Allergic to all fruit and raw tomatoes reaction face swells      Medication List    STOP taking these medications   aspirin 325 MG  tablet   GOODY HEADACHE PO     TAKE these medications   Adult Blood Pressure Cuff Lg Kit 1 Units by Does not apply route daily.   amLODipine 5 MG tablet Commonly known as:  NORVASC Take 1 tablet (5 mg total) by mouth daily.   butalbital-acetaminophen-caffeine 50-325-40 MG tablet Commonly known as:  FIORICET, ESGIC Take 1 tablet by mouth  every 6 (six) hours as needed for headache.   ferrous sulfate 325 (65 FE) MG tablet Take 1 tablet (325 mg total) by mouth 3 (three) times daily with meals. What changed:  when to take this   hydrochlorothiazide 25 MG tablet Commonly known as:  HYDRODIURIL Take 1 tablet (25 mg total) by mouth daily.      Allergies  Allergen Reactions  . Latex     Irritated and burning skin  . Other     Allergic to all fruit and raw tomatoes reaction face swells   Discharge Instructions    Diet - low sodium heart healthy   Complete by:  As directed    Discharge instructions   Complete by:  As directed    It is important that you read following instructions as well as go over your medication list with RN to help you understand your care after this hospitalization.  Discharge Instructions: Please follow-up with PCP in one week  Please request your primary care physician to go over all Hospital Tests and Procedure/Radiological results at the follow up,  Please get all Hospital records sent to your PCP by signing hospital release before you go home.   Do not drive, operating heavy machinery, perform activities at heights, swimming or participation in water activities or provide baby sitting services while you are on Pain, Sleep and Anxiety Medications; until you have been seen by Primary Care Physician or a Neurologist and advised to do so again. Do not take more than prescribed Pain, Sleep and Anxiety Medications. You were cared for by a hospitalist during your hospital stay. If you have any questions about your discharge medications or the care you received while you were in the hospital after you are discharged, you can call the unit and ask to speak with the hospitalist on call if the hospitalist that took care of you is not available.  Once you are discharged, your primary care physician will handle any further medical issues. Please note that NO REFILLS for any discharge medications will be  authorized once you are discharged, as it is imperative that you return to your primary care physician (or establish a relationship with a primary care physician if you do not have one) for your aftercare needs so that they can reassess your need for medications and monitor your lab values. You Must read complete instructions/literature along with all the possible adverse reactions/side effects for all the Medicines you take and that have been prescribed to you. Take any new Medicines after you have completely understood and accept all the possible adverse reactions/side effects. Wear Seat belts while driving. If you have smoked or chewed Tobacco in the last 2 yrs please stop smoking and/or stop any Recreational drug use.   Increase activity slowly   Complete by:  As directed      Discharge Exam: Filed Weights   03/29/17 1158  Weight: 127 kg (280 lb)   Vitals:   03/30/17 1041 03/30/17 1546  BP: (!) 153/95 135/77  Pulse: 83 74  Resp: 18 19  Temp: 98.4 F (36.9 C) 98 F (  36.7 C)  SpO2: 100% 100%   General: Appear in no distress, no Rash; Oral Mucosa moist. Cardiovascular: S1 and S2 Present, no Murmur, no JVD Respiratory: Bilateral Air entry present and Clear to Auscultation, no Crackles, no wheezes Abdomen: Bowel Sound present, Soft and no tenderness Extremities: no Pedal edema, no calf tenderness Neurology: Grossly no focal neuro deficit.  The results of significant diagnostics from this hospitalization (including imaging, microbiology, ancillary and laboratory) are listed below for reference.    Significant Diagnostic Studies: Dg Chest 2 View  Result Date: 03/29/2017 CLINICAL DATA:  Marked hypertension, exertional chest pain previous history of hypertension which had resolved. Current smoker. EXAM: CHEST  2 VIEW COMPARISON:  Report of a chest x-ray of July 03, 2009 FINDINGS: The lungs are adequately inflated and clear. The heart is mildly enlarged. The pulmonary vascularity is  normal. The mediastinum is normal in width. The trachea is midline. There is mild tortuosity of the descending thoracic aorta. The bony thorax exhibits no acute abnormality. IMPRESSION: There is no pneumonia nor other acute cardiopulmonary abnormality. Cardiomegaly without pulmonary edema. Electronically Signed   By: David  Martinique M.D.   On: 03/29/2017 16:11   Ct Head Wo Contrast  Result Date: 03/29/2017 CLINICAL DATA:  Headache EXAM: CT HEAD WITHOUT CONTRAST TECHNIQUE: Contiguous axial images were obtained from the base of the skull through the vertex without intravenous contrast. COMPARISON:  None. FINDINGS: Brain: No mass lesion, intraparenchymal hemorrhage or extra-axial collection. No evidence of acute cortical infarct. Normal appearance of the brain parenchyma and extra axial spaces for age. Vascular: No hyperdense vessel or unexpected vascular calcification. Skull: Normal visualized skull base, calvarium and extracranial soft tissues. Sinuses/Orbits: No sinus fluid levels or advanced mucosal thickening. No mastoid effusion. Normal orbits. IMPRESSION: Normal head CT. Electronically Signed   By: Ulyses Jarred M.D.   On: 03/29/2017 22:01    Microbiology: No results found for this or any previous visit (from the past 240 hour(s)).   Labs: CBC: Recent Labs  Lab 03/29/17 1242 03/30/17 0500  WBC 11.5* 10.6*  NEUTROABS 8.3*  --   HGB 9.9* 9.6*  HCT 32.3* 33.4*  MCV 65.5* 68.9*  PLT 519* 027*   Basic Metabolic Panel: Recent Labs  Lab 03/29/17 1242 03/30/17 0500  NA 138 138  K 3.6 4.1  CL 104 105  CO2 26 23  GLUCOSE 95 81  BUN 19 19  CREATININE 1.09* 1.04*  CALCIUM 8.9 8.7*  MG  --  2.1  PHOS  --  4.9*   Liver Function Tests: Recent Labs  Lab 03/30/17 0500  AST 23  ALT 16  ALKPHOS 59  BILITOT 0.3  PROT 7.1  ALBUMIN 3.2*   No results for input(s): LIPASE, AMYLASE in the last 168 hours. No results for input(s): AMMONIA in the last 168 hours. Cardiac Enzymes: Recent Labs   Lab 03/29/17 1242 03/29/17 1613 03/29/17 1859 03/29/17 2151  TROPONINI 0.05* 0.05* 0.05* 0.05*   BNP (last 3 results) No results for input(s): BNP in the last 8760 hours. CBG: No results for input(s): GLUCAP in the last 168 hours. Time spent: 35 minutes  Signed:  Berle Mull  [facility] 03/30/2017 12:19 AM

## 2018-07-24 ENCOUNTER — Other Ambulatory Visit: Payer: Self-pay

## 2018-07-24 ENCOUNTER — Emergency Department (HOSPITAL_BASED_OUTPATIENT_CLINIC_OR_DEPARTMENT_OTHER): Payer: No Typology Code available for payment source

## 2018-07-24 ENCOUNTER — Encounter (HOSPITAL_BASED_OUTPATIENT_CLINIC_OR_DEPARTMENT_OTHER): Payer: Self-pay | Admitting: Emergency Medicine

## 2018-07-24 ENCOUNTER — Emergency Department (HOSPITAL_BASED_OUTPATIENT_CLINIC_OR_DEPARTMENT_OTHER)
Admission: EM | Admit: 2018-07-24 | Discharge: 2018-07-24 | Disposition: A | Discharge status: Home / Self Care | Payer: No Typology Code available for payment source | Attending: Emergency Medicine | Admitting: Emergency Medicine

## 2018-07-24 DIAGNOSIS — R0602 Shortness of breath: Secondary | ICD-10-CM | POA: Diagnosis not present

## 2018-07-24 DIAGNOSIS — Z79899 Other long term (current) drug therapy: Secondary | ICD-10-CM | POA: Insufficient documentation

## 2018-07-24 DIAGNOSIS — M7989 Other specified soft tissue disorders: Secondary | ICD-10-CM | POA: Diagnosis present

## 2018-07-24 DIAGNOSIS — E876 Hypokalemia: Secondary | ICD-10-CM | POA: Insufficient documentation

## 2018-07-24 DIAGNOSIS — R600 Localized edema: Secondary | ICD-10-CM | POA: Diagnosis not present

## 2018-07-24 DIAGNOSIS — I10 Essential (primary) hypertension: Secondary | ICD-10-CM | POA: Insufficient documentation

## 2018-07-24 DIAGNOSIS — F17210 Nicotine dependence, cigarettes, uncomplicated: Secondary | ICD-10-CM | POA: Diagnosis not present

## 2018-07-24 LAB — CBC WITH DIFFERENTIAL/PLATELET
Abs Immature Granulocytes: 0.06 10*3/uL (ref 0.00–0.07)
Basophils Absolute: 0.1 10*3/uL (ref 0.0–0.1)
Basophils Relative: 1 %
Eosinophils Absolute: 0.2 10*3/uL (ref 0.0–0.5)
Eosinophils Relative: 2 %
HCT: 35.2 % — ABNORMAL LOW (ref 36.0–46.0)
Hemoglobin: 10.7 g/dL — ABNORMAL LOW (ref 12.0–15.0)
Immature Granulocytes: 1 %
Lymphocytes Relative: 21 %
Lymphs Abs: 2 10*3/uL (ref 0.7–4.0)
MCH: 25.9 pg — ABNORMAL LOW (ref 26.0–34.0)
MCHC: 30.4 g/dL (ref 30.0–36.0)
MCV: 85.2 fL (ref 80.0–100.0)
Monocytes Absolute: 0.6 10*3/uL (ref 0.1–1.0)
Monocytes Relative: 7 %
Neutro Abs: 6.6 10*3/uL (ref 1.7–7.7)
Neutrophils Relative %: 68 %
Platelets: 405 10*3/uL — ABNORMAL HIGH (ref 150–400)
RBC: 4.13 MIL/uL (ref 3.87–5.11)
RDW: 18.6 % — ABNORMAL HIGH (ref 11.5–15.5)
WBC: 9.6 10*3/uL (ref 4.0–10.5)
nRBC: 0 % (ref 0.0–0.2)

## 2018-07-24 LAB — COMPREHENSIVE METABOLIC PANEL
ALT: 25 U/L (ref 0–44)
AST: 17 U/L (ref 15–41)
Albumin: 3.3 g/dL — ABNORMAL LOW (ref 3.5–5.0)
Alkaline Phosphatase: 59 U/L (ref 38–126)
Anion gap: 7 (ref 5–15)
BUN: 17 mg/dL (ref 6–20)
CO2: 20 mmol/L — ABNORMAL LOW (ref 22–32)
Calcium: 8.2 mg/dL — ABNORMAL LOW (ref 8.9–10.3)
Chloride: 109 mmol/L (ref 98–111)
Creatinine, Ser: 0.92 mg/dL (ref 0.44–1.00)
GFR calc Af Amer: >60 mL/min (ref >60)
GFR calc non Af Amer: >60 mL/min (ref >60)
Glucose, Bld: 96 mg/dL (ref 70–99)
Potassium: 3.3 mmol/L — ABNORMAL LOW (ref 3.5–5.1)
Sodium: 136 mmol/L (ref 135–145)
Total Bilirubin: 0.2 mg/dL — ABNORMAL LOW (ref 0.3–1.2)
Total Protein: 6.9 g/dL (ref 6.5–8.1)

## 2018-07-24 LAB — TROPONIN I (HIGH SENSITIVITY): Troponin I (High Sensitivity): 6 ng/L (ref <18)

## 2018-07-24 LAB — BRAIN NATRIURETIC PEPTIDE: B Natriuretic Peptide: 26.2 pg/mL (ref 0.0–100.0)

## 2018-07-24 MED ORDER — FUROSEMIDE 20 MG PO TABS: 20.0000 mg | ORAL_TABLET | Freq: Every day | ORAL | 0 refills | Status: DC

## 2018-07-24 MED ORDER — POTASSIUM CHLORIDE CRYS ER 20 MEQ PO TBCR: 20.0000 meq | EXTENDED_RELEASE_TABLET | Freq: Two times a day (BID) | ORAL | 0 refills | Status: DC

## 2018-07-24 NOTE — ED Notes (Signed)
Pt provided water per [place] from [place]

## 2018-07-24 NOTE — Discharge Instructions (Signed)
Please read and follow all provided instructions.  Your diagnoses today include:  1. Bilateral lower extremity edema   2. Hypokalemia     Tests performed today include:  Chest x-ray -no fluid or infection in the lungs  Blood test for your heart -no signs of stress on the heart  EKG -improved from last year  Blood counts electrolytes -slightly low potassium, normal kidney function  Vital signs. See below for your results today.   Medications prescribed:   Lasix - low-dose fluid pill   Potassium supplement  Take any prescribed medications only as directed.  Home care instructions:  Follow any educational materials contained in this packet.  BE VERY CAREFUL not to take multiple medicines containing Tylenol (also called acetaminophen). Doing so can lead to an overdose which can damage your liver and cause liver failure and possibly death.   Follow-up instructions: Please follow-up with your primary care provider in the next 3 days for further evaluation of your symptoms.   Return instructions:   Please return to the Emergency Department if you experience worsening symptoms.   Return if you develop chest pain, worsening shortness of breath, fever.  Please return if you have any other emergent concerns.  Additional Information:  Your vital signs today were: BP (!) 161/100 (BP Location: Right Arm)    Pulse 92    Temp 98.4 F (36.9 C) (Oral)    Resp 20    LMP 07/08/2018 (Within Days)    SpO2 100%  If your blood pressure (BP) was elevated above 135/85 this visit, please have this repeated by your doctor within one month. --------------

## 2018-07-24 NOTE — ED Provider Notes (Signed)
[facility] Provider Note   CSN: [id number] Arrival date & time: 07/24/18  1759     History   Chief Complaint Chief Complaint  Patient presents with  . Leg Swelling  . Shortness of Breath    HPI Cristina Adkins is a 38 y.o. female.     Patient with history of hypertension, admission last year for hypertensive emergency, echocardiogram at that time with cardiomegaly with mild diastolic heart failure --presents to the emergency department today with complaints of bilateral lower extremity swelling as well as left arm swelling.  Patient states that she was up on her feet a lot today.  She states that she has had intermittent episodes of swelling of her legs over the past 2 weeks.  Typically this is better in the morning and worse at the end of the day after sitting and walking for long period of time.  She states that she saw her primary care doctor, in part for this, and they sent her for labs however the nurse was unable to get blood from her.  She denies any chest pain or shortness of breath.  No angioedema, hives, vomiting or diarrhea to suggest anaphylaxis.  She states that her hand and wrist "feels stiff".  No weakness.  Patient denies any fevers or cough.  No URI symptoms.  No urinary symptoms.  She has been taking her blood pressure medications.  No history of kidney or liver dysfunction.     Past Medical History:  Diagnosis Date  . Hypertension     Patient Active Problem List   Diagnosis Date Noted  . Hypertensive urgency 03/29/2017  . Elevated troponin 03/29/2017  . Cardiomegaly 03/29/2017  . Tobacco abuse 03/29/2017  . Anemia 03/29/2017  . Heavy menstrual bleeding 03/29/2017    History reviewed. No pertinent surgical history.   OB History    Gravida  1   Para      Term      Preterm      AB      Living        SAB      TAB      Ectopic      Multiple      Live Births               Home Medications    Prior  to Admission medications   Medication Sig Start Date End Date Taking? Authorizing Provider  amLODipine (NORVASC) 5 MG tablet Take 1 tablet (5 mg total) by mouth daily. 03/30/17  Yes Lavina Hamman, MD  Blood Pressure Monitoring (ADULT BLOOD PRESSURE CUFF LG) KIT 1 Units by Does not apply route daily. 07/11/14  Yes Elby Beck, FNP  butalbital-acetaminophen-caffeine (FIORICET, ESGIC) [phone] MG tablet Take 1 tablet by mouth every 6 (six) hours as needed for headache. 03/30/17  Yes Lavina Hamman, MD  ferrous sulfate 325 (65 FE) MG tablet Take 1 tablet (325 mg total) by mouth 3 (three) times daily with meals. 03/30/17  Yes Lavina Hamman, MD  hydrochlorothiazide (HYDRODIURIL) 25 MG tablet Take 1 tablet (25 mg total) by mouth daily. 03/31/17  Yes Lavina Hamman, MD  furosemide (LASIX) 20 MG tablet Take 1 tablet (20 mg total) by mouth daily for 3 days. Take with potassium supplement. 07/24/18 07/27/18  Carlisle Cater, PA-C  potassium chloride SA (K-DUR) 20 MEQ tablet Take 1 tablet (20 mEq total) by mouth 2 (two) times daily. 07/24/18   Carlisle Cater, PA-C  Family History Family History  Problem Relation Age of Onset  . Hypertension Mother     Social History Social History   Tobacco Use  . Smoking status: Current Every Day Smoker    Packs/day: 1.00    Years: 9.00    Pack years: 9.00  . Smokeless tobacco: Never Used  Substance Use Topics  . Alcohol use: No  . Drug use: No     Allergies   Latex and Other   Review of Systems Review of Systems  Constitutional: Negative for diaphoresis and fever.  Eyes: Negative for redness.  Respiratory: Positive for shortness of breath. Negative for cough.   Cardiovascular: Positive for leg swelling. Negative for chest pain and palpitations.  Gastrointestinal: Negative for abdominal pain, nausea and vomiting.  Genitourinary: Negative for dysuria.  Musculoskeletal: Negative for back pain and neck pain.  Skin: Negative for rash.  Neurological:  Negative for syncope and light-headedness.  Psychiatric/Behavioral: The patient is not nervous/anxious.      Physical Exam Updated Vital Signs BP (!) 161/100 (BP Location: Right Arm)   Pulse 92   Temp 98.4 F (36.9 C) (Oral)   Resp 20   LMP 07/08/2018 (Within Days)   SpO2 100%   Physical Exam Vitals signs and nursing note reviewed.  Constitutional:      Appearance: She is well-developed. She is not diaphoretic.  HENT:     Head: Normocephalic and atraumatic.     Mouth/Throat:     Mouth: Mucous membranes are not dry.  Eyes:     Conjunctiva/sclera: Conjunctivae normal.  Neck:     Musculoskeletal: Normal range of motion and neck supple. No muscular tenderness.     Vascular: Normal carotid pulses. No carotid bruit or JVD.     Trachea: Trachea normal. No tracheal deviation.  Cardiovascular:     Rate and Rhythm: Normal rate and regular rhythm.     Pulses: No decreased pulses.          Radial pulses are 2+ on the right side and 2+ on the left side.       Dorsalis pedis pulses are 2+ on the right side and 2+ on the left side.     Heart sounds: Normal heart sounds, S1 normal and S2 normal. No murmur.  Pulmonary:     Effort: Pulmonary effort is normal. No respiratory distress.     Breath sounds: No wheezing.  Chest:     Chest wall: No tenderness.  Abdominal:     General: Bowel sounds are normal.     Palpations: Abdomen is soft.     Tenderness: There is no abdominal tenderness. There is no guarding or rebound.  Musculoskeletal: Normal range of motion.     Right lower leg: She exhibits no tenderness. Edema present.     Left lower leg: She exhibits no tenderness. Edema present.     Comments: Trace edema, pitting, to the bilateral lower extremities, symmetric, to the mid calves.  No calf tenderness, erythema or signs of cellulitis.  No obvious edema to the left or right arm.  Arms appear symmetric.  No tenderness of the forearm or upper arm.  Skin:    General: Skin is warm and  dry.     Coloration: Skin is not pale.  Neurological:     Mental Status: She is alert.      ED Treatments / Results  Labs (all labs ordered are listed, but only abnormal results are displayed) Labs Reviewed  CBC WITH DIFFERENTIAL/PLATELET -  Abnormal; Notable for the following components:      Result Value   Hemoglobin 10.7 (*)    HCT 35.2 (*)    MCH 25.9 (*)    RDW 18.6 (*)    Platelets 405 (*)    All other components within normal limits  COMPREHENSIVE METABOLIC PANEL - Abnormal; Notable for the following components:   Potassium 3.3 (*)    CO2 20 (*)    Calcium 8.2 (*)    Albumin 3.3 (*)    Total Bilirubin 0.2 (*)    All other components within normal limits  BRAIN NATRIURETIC PEPTIDE  TROPONIN I (HIGH SENSITIVITY)  TROPONIN I (HIGH SENSITIVITY)    EKG EKG Interpretation  Date/Time:  Sunday July 24 2018 18:19:08 EDT Ventricular Rate:  82 PR Interval:    QRS Duration: 80 QT Interval:  375 QTC Calculation: 438 R Axis:   4 Text Interpretation:  Sinus rhythm Consider left atrial enlargement Low voltage, precordial leads No significant change since last tracing Confirmed by Blanchie Dessert [phone]) on 07/24/2018 6:57:29 PM   Radiology Dg Chest 2 View  Result Date: 07/24/2018 CLINICAL DATA:  Shortness of breath, swelling EXAM: CHEST - 2 VIEW COMPARISON:  03/29/2017 FINDINGS: Heart and mediastinal contours are within normal limits. No focal opacities or effusions. No acute bony abnormality. IMPRESSION: No active cardiopulmonary disease. Electronically Signed   By: Rolm Baptise M.D.   On: 07/24/2018 19:32    Procedures Procedures (including critical care time)  Medications Ordered in ED Medications - No data to display   Initial Impression / Assessment and Plan / ED Course  I have reviewed the triage vital signs and the nursing notes.  Pertinent labs & imaging results that were available during my care of the patient were reviewed by me and considered in my  medical decision making (see chart for details).        Patient seen and examined. Work-up initiated.  EKG reviewed personally.  Vital signs reviewed and are as follows: BP (!) 161/100 (BP Location: Right Arm)   Pulse 92   Temp 98.4 F (36.9 C) (Oral)   Resp 20   LMP 07/08/2018 (Within Days)   SpO2 100%   8:32 PM reviewed lab work results.  Patient has ambulated with oxygen saturation down to 93%.  I observed her walking and she was not visibly short of breath.  She did not have any chest pain.  Discussed results with Dr. Maryan Rued.  I discussed the lab, x-ray, EKG findings with the patient.  We discussed conservative measures for lower extremity edema including compression stockings, elevation when able.  Patient states that she sits a lot during the day at work but has had a stool recently to prop her legs up.  I discussed risks and benefits of low-dose Lasix for 3 days along with potassium supplementation to see if this helps reduce her swelling.  Patient is uncomfortable and would like something to try for her leg swelling.  Will prescribe 20 mg Lasix for 3 days along with twice daily potassium supplementation.  Part of her low potassium is likely due to her use of HCTZ.  I strongly encouraged continued follow-up with her PCP to continue working on this problem.  Final Clinical Impressions(s) / ED Diagnoses   Final diagnoses:  Bilateral lower extremity edema  Hypokalemia   Patient with bilateral, symmetric lower extremity edema.  Likely related to venous stasis as well as the patient's body habitus.  Low concern for DVT at  this time.  Patient reports some stiffness and swelling in her left arm however no gross swelling noted on exam.  Left upper extremity is neurovascularly intact.  No clinical signs and symptoms of upper extremity DVT.  Patient has good peripheral pulses.  Lungs are clear on exam and chest x-ray is clear.  BNP is normal.  Troponin is normal.  EKG is nonischemic and  unchanged from prior.  Mildly low potassium without any concerning EKG changes.  Patient be treated as above and will follow-up closely with her primary care doctor.   ED Discharge Orders         Ordered    furosemide (LASIX) 20 MG tablet  Daily     07/24/18 2024    potassium chloride SA (K-DUR) 20 MEQ tablet  2 times daily     07/24/18 2024           Carlisle Cater, Hershal Coria 07/24/18 2035    Blanchie Dessert, MD 07/25/18 1539

## 2018-07-24 NOTE — ED Triage Notes (Signed)
Pt here with bilateral leg swelling and right arm swelling x 2 weeks, SOB, and myalgia

## 2018-07-24 NOTE — ED Notes (Signed)
Pt returned from XR at this time. 

## 2019-01-02 ENCOUNTER — Emergency Department (HOSPITAL_BASED_OUTPATIENT_CLINIC_OR_DEPARTMENT_OTHER): Payer: No Typology Code available for payment source

## 2019-01-02 ENCOUNTER — Other Ambulatory Visit: Payer: Self-pay

## 2019-01-02 ENCOUNTER — Encounter (HOSPITAL_BASED_OUTPATIENT_CLINIC_OR_DEPARTMENT_OTHER): Payer: Self-pay

## 2019-01-02 ENCOUNTER — Emergency Department (HOSPITAL_BASED_OUTPATIENT_CLINIC_OR_DEPARTMENT_OTHER)
Admission: EM | Admit: 2019-01-02 | Discharge: 2019-01-02 | Disposition: A | Discharge status: Home / Self Care | Payer: No Typology Code available for payment source | Attending: Emergency Medicine | Admitting: Emergency Medicine

## 2019-01-02 DIAGNOSIS — M7918 Myalgia, other site: Secondary | ICD-10-CM | POA: Insufficient documentation

## 2019-01-02 DIAGNOSIS — F17210 Nicotine dependence, cigarettes, uncomplicated: Secondary | ICD-10-CM | POA: Insufficient documentation

## 2019-01-02 DIAGNOSIS — J069 Acute upper respiratory infection, unspecified: Secondary | ICD-10-CM | POA: Diagnosis not present

## 2019-01-02 DIAGNOSIS — R05 Cough: Secondary | ICD-10-CM | POA: Diagnosis present

## 2019-01-02 DIAGNOSIS — R079 Chest pain, unspecified: Secondary | ICD-10-CM | POA: Insufficient documentation

## 2019-01-02 DIAGNOSIS — I10 Essential (primary) hypertension: Secondary | ICD-10-CM | POA: Insufficient documentation

## 2019-01-02 DIAGNOSIS — Z20828 Contact with and (suspected) exposure to other viral communicable diseases: Secondary | ICD-10-CM | POA: Insufficient documentation

## 2019-01-02 DIAGNOSIS — Z91040 Latex allergy status: Secondary | ICD-10-CM | POA: Diagnosis not present

## 2019-01-02 DIAGNOSIS — Z79899 Other long term (current) drug therapy: Secondary | ICD-10-CM | POA: Diagnosis not present

## 2019-01-02 LAB — CBC WITH DIFFERENTIAL/PLATELET
Abs Immature Granulocytes: 0.07 10*3/uL (ref 0.00–0.07)
Basophils Absolute: 0.1 10*3/uL (ref 0.0–0.1)
Basophils Relative: 1 %
Eosinophils Absolute: 0.2 10*3/uL (ref 0.0–0.5)
Eosinophils Relative: 2 %
HCT: 40.4 % (ref 36.0–46.0)
Hemoglobin: 12.3 g/dL (ref 12.0–15.0)
Immature Granulocytes: 1 %
Lymphocytes Relative: 22 %
Lymphs Abs: 2 10*3/uL (ref 0.7–4.0)
MCH: 25.1 pg — ABNORMAL LOW (ref 26.0–34.0)
MCHC: 30.4 g/dL (ref 30.0–36.0)
MCV: 82.4 fL (ref 80.0–100.0)
Monocytes Absolute: 0.7 10*3/uL (ref 0.1–1.0)
Monocytes Relative: 8 %
Neutro Abs: 6.1 10*3/uL (ref 1.7–7.7)
Neutrophils Relative %: 66 %
Platelets: 483 10*3/uL — ABNORMAL HIGH (ref 150–400)
RBC: 4.9 MIL/uL (ref 3.87–5.11)
RDW: 19.8 % — ABNORMAL HIGH (ref 11.5–15.5)
WBC: 9 10*3/uL (ref 4.0–10.5)
nRBC: 0 % (ref 0.0–0.2)

## 2019-01-02 LAB — BASIC METABOLIC PANEL
Anion gap: 7 (ref 5–15)
BUN: 13 mg/dL (ref 6–20)
CO2: 25 mmol/L (ref 22–32)
Calcium: 8.5 mg/dL — ABNORMAL LOW (ref 8.9–10.3)
Chloride: 107 mmol/L (ref 98–111)
Creatinine, Ser: 1 mg/dL (ref 0.44–1.00)
GFR calc Af Amer: >60 mL/min (ref >60)
GFR calc non Af Amer: >60 mL/min (ref >60)
Glucose, Bld: 83 mg/dL (ref 70–99)
Potassium: 4.1 mmol/L (ref 3.5–5.1)
Sodium: 139 mmol/L (ref 135–145)

## 2019-01-02 LAB — HCG, SERUM, QUALITATIVE: Preg, Serum: NEGATIVE

## 2019-01-02 LAB — TROPONIN I (HIGH SENSITIVITY): Troponin I (High Sensitivity): 6 ng/L (ref <18)

## 2019-01-02 LAB — SARS CORONAVIRUS 2 AG (30 MIN TAT): SARS Coronavirus 2 Ag: NEGATIVE

## 2019-01-02 NOTE — ED Provider Notes (Signed)
[facility] Provider Note   CSN: [id number] Arrival date & time: 01/02/19  1120     History   Chief Complaint Chief Complaint  Patient presents with  . Cough  . Chest Pain    HPI Cristina Adkins is a 38 y.o. female.  Cough, chest pain.  Patient reports symptoms since Thursday, noted cough initially, then gradual onset of chest pain.  Pain normally occurs only when coughing.  Both sides underneath both breasts.  Sharp, stabbing sensation.  Improves when her coughing spells stop.  Not associated with exertion.  No significant shortness of breath associated with the symptoms.  No fevers.  Has had some chills.  Occasional body ache but no headaches.  Cough is nonbloody, occasional green sputum.        HPI  Past Medical History:  Diagnosis Date  . Hypertension     Patient Active Problem List   Diagnosis Date Noted  . Hypertensive urgency 03/29/2017  . Elevated troponin 03/29/2017  . Cardiomegaly 03/29/2017  . Tobacco abuse 03/29/2017  . Anemia 03/29/2017  . Heavy menstrual bleeding 03/29/2017    History reviewed. No pertinent surgical history.   OB History    Gravida  1   Para      Term      Preterm      AB      Living        SAB      TAB      Ectopic      Multiple      Live Births               Home Medications    Prior to Admission medications   Medication Sig Start Date End Date Taking? Authorizing Provider  lisinopril (ZESTRIL) 10 MG tablet Take 10 mg by mouth daily.   Yes [provider]  amLODipine (NORVASC) 5 MG tablet Take 1 tablet (5 mg total) by mouth daily. 03/30/17   Lavina Hamman, MD  Blood Pressure Monitoring (ADULT BLOOD PRESSURE CUFF LG) KIT 1 Units by Does not apply route daily. 07/11/14   Elby Beck, FNP  butalbital-acetaminophen-caffeine (FIORICET, ESGIC) [phone] MG tablet Take 1 tablet by mouth every 6 (six) hours as needed for headache. 03/30/17   Lavina Hamman, MD  ferrous  sulfate 325 (65 FE) MG tablet Take 1 tablet (325 mg total) by mouth 3 (three) times daily with meals. 03/30/17   Lavina Hamman, MD  furosemide (LASIX) 20 MG tablet Take 1 tablet (20 mg total) by mouth daily for 3 days. Take with potassium supplement. 07/24/18 07/27/18  Carlisle Cater, PA-C  hydrochlorothiazide (HYDRODIURIL) 25 MG tablet Take 1 tablet (25 mg total) by mouth daily. 03/31/17   Lavina Hamman, MD  potassium chloride SA (K-DUR) 20 MEQ tablet Take 1 tablet (20 mEq total) by mouth 2 (two) times daily. 07/24/18   Carlisle Cater, PA-C    Family History Family History  Problem Relation Age of Onset  . Hypertension Mother     Social History Social History   Tobacco Use  . Smoking status: Current Every Day Smoker    Packs/day: 1.00    Years: 9.00    Pack years: 9.00  . Smokeless tobacco: Never Used  Substance Use Topics  . Alcohol use: No  . Drug use: No     Allergies   Latex and Other   Review of Systems Review of Systems  Constitutional: Negative for chills and fever.  HENT: Negative for ear pain and sore throat.   Eyes: Negative for pain and visual disturbance.  Respiratory: Positive for cough. Negative for shortness of breath.   Cardiovascular: Positive for chest pain. Negative for palpitations.  Gastrointestinal: Negative for abdominal pain and vomiting.  Genitourinary: Negative for dysuria and hematuria.  Musculoskeletal: Negative for arthralgias and back pain.  Skin: Negative for color change and rash.  Neurological: Negative for seizures and syncope.  All other systems reviewed and are negative.    Physical Exam Updated Vital Signs BP (!) 160/113   Pulse 73   Resp 17   Ht 5' 1"  (1.549 m)   Wt 131.5 kg   LMP 12/14/2018   SpO2 98%   BMI 54.80 kg/m   Physical Exam   ED Treatments / Results  Labs (all labs ordered are listed, but only abnormal results are displayed) Labs Reviewed  CBC WITH DIFFERENTIAL/PLATELET - Abnormal; Notable for the  following components:      Result Value   MCH 25.1 (*)    RDW 19.8 (*)    Platelets 483 (*)    All other components within normal limits  BASIC METABOLIC PANEL - Abnormal; Notable for the following components:   Calcium 8.5 (*)    All other components within normal limits  SARS CORONAVIRUS 2 AG (30 MIN TAT)  NOVEL CORONAVIRUS, NAA (HOSP ORDER, SEND-OUT TO REF LAB; TAT 18-24 HRS)  HCG, SERUM, QUALITATIVE  TROPONIN I (HIGH SENSITIVITY)    EKG EKG Interpretation  Date/Time:  Monday January 02 2019 11:27:40 EST Ventricular Rate:  87 PR Interval:  166 QRS Duration: 72 QT Interval:  366 QTC Calculation: 440 R Axis:   -13 Text Interpretation: Poor data quality, interpretation may be adversely affected Normal sinus rhythm Cannot rule out Anterior infarct , age undetermined Abnormal ECG Confirmed by Madalyn Rob [phone] on 01/02/2019 12:54:15 PM   Radiology Dg Chest Portable 1 View  Result Date: 01/02/2019 CLINICAL DATA:  Cough, chest pain EXAM: PORTABLE CHEST 1 VIEW COMPARISON:  07/24/2018 FINDINGS: Cardiomegaly. No abnormality of the lungs on underpenetrated AP portable radiograph. The visualized skeletal structures are unremarkable. IMPRESSION: Cardiomegaly without acute abnormality of the lungs on underpenetrated AP portable radiograph. PA and lateral radiographs may be helpful to further evaluate if there is a strong suspicion for airspace disease. Electronically Signed   By: Eddie Candle M.D.   On: 01/02/2019 13:30    Procedures Procedures (including critical care time)  Medications Ordered in ED Medications - No data to display   Initial Impression / Assessment and Plan / ED Course  I have reviewed the triage vital signs and the nursing notes.  Pertinent labs & imaging results that were available during my care of the patient were reviewed by me and considered in my medical decision making (see chart for details).  Clinical Course as of Jan 02 1416  Mon Jan 02, 2019   1415 Rechecked, updated on results, reviewed precautions, outpatient follow-up and will discharge   [RD]    Clinical Course User Index [RD] Lucrezia Starch, MD       38 year old who presented to ER with chief complaint of cough, associated chest pain.  On exam patient noted to be well-appearing with stable vital signs.  Rapid antigen Covid negative.  Will send PCR.  No obvious pneumonia on CXR, no fever, doubt pneumonia.  Suspect viral URI.  Labs within normal limits.  No acute ischemic EKG changes, troponin within normal limits, doubt ACS.  No  tachypnea, no hypoxia, no shortness of breath, doubt PE.  Suspect her chest pain related to frequent coughing.  Recommend recheck with primary doctor in 2 days.  Reviewed return precautions as follows isolation precautions until PCR test has finalized.    After the discussed management above, the patient was determined to be safe for discharge.  The patient was in agreement with this plan and all questions regarding their care were answered.  ED return precautions were discussed and the patient will return to the ED with any significant worsening of condition.    Final Clinical Impressions(s) / ED Diagnoses   Final diagnoses:  Viral URI with cough    ED Discharge Orders    None       Lucrezia Starch, MD 01/02/19 1419

## 2019-01-02 NOTE — Discharge Instructions (Signed)
Recommend calling your primary doctor to schedule follow-up appointment in 24 to 48 hours for recheck and to discuss the symptoms you are experiencing today.  If you develop fever, difficulty breathing, chest pain or other new concerning symptom recommend return to ER for reassessment.  Please follow isolation precautions until we have the results of your coronavirus PCR test which will hopefully result tomorrow at some point.

## 2019-01-02 NOTE — ED Triage Notes (Signed)
Pt reports that she has had a cough and chest pain since Thursday.

## 2019-01-04 LAB — NOVEL CORONAVIRUS, NAA (HOSP ORDER, SEND-OUT TO REF LAB; TAT 18-24 HRS): SARS-CoV-2, NAA: NOT DETECTED

## 2019-02-28 ENCOUNTER — Ambulatory Visit (INDEPENDENT_AMBULATORY_CARE_PROVIDER_SITE_OTHER): Payer: No Typology Code available for payment source | Admitting: Family

## 2019-02-28 ENCOUNTER — Other Ambulatory Visit: Payer: Self-pay

## 2019-02-28 ENCOUNTER — Encounter: Payer: Self-pay | Admitting: Family

## 2019-02-28 VITALS — BP 153/105 | HR 105 | Temp 96.8°F | Resp 16 | Ht 61.0 in | Wt 298.0 lb

## 2019-02-28 DIAGNOSIS — D509 Iron deficiency anemia, unspecified: Secondary | ICD-10-CM

## 2019-02-28 DIAGNOSIS — I10 Essential (primary) hypertension: Secondary | ICD-10-CM | POA: Diagnosis not present

## 2019-02-28 DIAGNOSIS — G4733 Obstructive sleep apnea (adult) (pediatric): Secondary | ICD-10-CM

## 2019-02-28 MED ORDER — LISINOPRIL 10 MG PO TABS: 10.0000 mg | ORAL_TABLET | Freq: Every day | ORAL | 0 refills | Status: DC

## 2019-02-28 MED ORDER — AMLODIPINE BESYLATE 5 MG PO TABS: 5.0000 mg | ORAL_TABLET | Freq: Every day | ORAL | 0 refills | Status: DC

## 2019-02-28 MED ORDER — HYDROCHLOROTHIAZIDE 25 MG PO TABS: 25.0000 mg | ORAL_TABLET | Freq: Every day | ORAL | 0 refills | Status: DC

## 2019-02-28 NOTE — Patient Instructions (Signed)
Please restart all your blood pressure medication. Complete lab work prior to leaving.

## 2019-02-28 NOTE — Progress Notes (Signed)
Subjective:    Patient ID: Cristina Adkins, female    DOB: February 19, 1980, 39 y.o.   MRN: [id number]  HPI  Patient is a 39 yr old female who presents today to establish care. Previously followed with [facility].   Pmhx is significant for the following:  Reports that she stopped breathing during her endoscopy at [facility] and they "had to do chest compressions."  She states that they sent her home the same day and did not admit her. She thinks that her breathing issue was related to her OSA.   Reports that she has OSA- has a CPAP machine- was working with sleep specialist. Admits to poor compliance.   Hypokalemia-hx hypokalemia- not taking potassium.  HTN- ran out of amlodipine BP Readings from Last 3 Encounters:  02/28/19 (!) 153/105  01/02/19 (!) 157/120  07/24/18 (!) 161/100   Anemia- not taking an iron supplement currently.   Lab Results  Component Value Date   WBC 9.0 01/02/2019   HGB 12.3 01/02/2019   HCT 40.4 01/02/2019   MCV 82.4 01/02/2019   PLT 483 (H) 01/02/2019   Morbid obesity-  24 hr recall  [employer]- cold brew   9 PM [employer] grilled chicken wrap.   Likes to eat ice     Review of Systems   See HPI   Past Medical History:  Diagnosis Date  . Hypertension   . Hypokalemia      Social History   Socioeconomic History  . Marital status: Single    Spouse name: Not on file  . Number of children: 1  . Years of education: Not on file  . Highest education level: Not on file  Occupational History  . Occupation: Market researcher  Tobacco Use  . Smoking status: Current Every Day Smoker    Packs/day: 1.00    Years: 9.00    Pack years: 9.00    Types: Cigarettes  . Smokeless tobacco: Never Used  Substance and Sexual Activity  . Alcohol use: No  . Drug use: No  . Sexual activity: Yes    Partners: Male    Birth control/protection: None  Other Topics Concern  . Not on file  Social History Narrative  . Not on file     Social Determinants of Health   Financial Resource Strain:   . Difficulty of Paying Living Expenses: Not on file  Food Insecurity:   . Worried About Charity fundraiser in the Last Year: Not on file  . Ran Out of Food in the Last Year: Not on file  Transportation Needs:   . Lack of Transportation (Medical): Not on file  . Lack of Transportation (Non-Medical): Not on file  Physical Activity:   . Days of Exercise per Week: Not on file  . Minutes of Exercise per Session: Not on file  Stress:   . Feeling of Stress : Not on file  Social Connections:   . Frequency of Communication with Friends and Family: Not on file  . Frequency of Social Gatherings with Friends and Family: Not on file  . Attends Religious Services: Not on file  . Active Member of Clubs or Organizations: Not on file  . Attends Archivist Meetings: Not on file  . Marital Status: Not on file  Intimate Partner Violence:   . Fear of Current or Ex-Partner: Not on file  . Emotionally Abused: Not on file  . Physically Abused: Not on file  . Sexually Abused: Not on file  History reviewed. No pertinent surgical history.  Family History  Problem Relation Age of Onset  . Hypertension Mother   . Hypertension Maternal Grandmother   . Diabetes Daughter   . Lung cancer Daughter   . Heart disease Maternal Uncle   . Diabetes Maternal Uncle   . Colon cancer Maternal Uncle     Allergies  Allergen Reactions  . Latex     Irritated and burning skin  . Other     Allergic to all fruit and raw tomatoes reaction face swells    Current Outpatient Medications on File Prior to Visit  Medication Sig Dispense Refill  . amLODipine (NORVASC) 5 MG tablet Take 1 tablet (5 mg total) by mouth daily. (Patient not taking: Reported on 02/28/2019) 30 tablet 0  . Blood Pressure Monitoring (ADULT BLOOD PRESSURE CUFF LG) KIT 1 Units by Does not apply route daily. 1 each 0  . ferrous sulfate 325 (65 FE) MG tablet Take 1 tablet (325  mg total) by mouth 3 (three) times daily with meals. (Patient not taking: Reported on 02/28/2019) 180 tablet 0  . furosemide (LASIX) 20 MG tablet Take 1 tablet (20 mg total) by mouth daily for 3 days. Take with potassium supplement. 3 tablet 0  . hydrochlorothiazide (HYDRODIURIL) 25 MG tablet Take 1 tablet (25 mg total) by mouth daily. (Patient not taking: Reported on 02/28/2019) 30 tablet 0  . lisinopril (ZESTRIL) 10 MG tablet Take 10 mg by mouth daily.    . potassium chloride SA (K-DUR) 20 MEQ tablet Take 1 tablet (20 mEq total) by mouth 2 (two) times daily. (Patient not taking: Reported on 02/28/2019) 10 tablet 0   No current facility-administered medications on file prior to visit.    BP (!) 153/105 (BP Location: Right Arm, Patient Position: Sitting, Cuff Size: Large)   Pulse (!) 105   Temp (!) 96.8 F (36 C) (Temporal)   Resp 16   Ht 5' 1"  (1.549 m)   Wt 298 lb (135.2 kg)   SpO2 100%   BMI 56.31 kg/m     Objective:   Physical Exam Constitutional:      Appearance: She is well-developed.  Neck:     Thyroid: No thyromegaly.  Cardiovascular:     Rate and Rhythm: Normal rate and regular rhythm.     Heart sounds: Normal heart sounds. No murmur.  Pulmonary:     Effort: Pulmonary effort is normal. No respiratory distress.     Breath sounds: Normal breath sounds. No wheezing.  Musculoskeletal:     Cervical back: Neck supple.  Skin:    General: Skin is warm and dry.  Neurological:     Mental Status: She is alert and oriented to person, place, and time.  Psychiatric:        Behavior: Behavior normal.        Thought Content: Thought content normal.        Judgment: Judgment normal.           Assessment & Plan:  HTN- uncontrolled, restart amlodipine.  Morbid obesity- it sounds like she is not eating enough calories.  We discussed healthy diet, exercise, weight loss.  Will refer to healthy weight[facility]wellness center.  Anemia- H/H stable.  Lab Results  Component Value Date    WBC 10.3 02/28/2019   HGB 12.8 02/28/2019   HCT 40.1 02/28/2019   MCV 79.4 02/28/2019   PLT 539.0 (H) 02/28/2019   OSA- reinforced importance of compliance with cpap. Will refer to sleep specialist  for ongoing management.  Hypokalemia- follow up K+ is normal. Continue to monitor.   Will request records from [facility]  50 minutes spent on today's visit.   This visit occurred during the SARS-CoV-2 public health emergency.  Safety protocols were in place, including screening questions prior to the visit, additional usage of staff PPE, and extensive cleaning of exam room while observing appropriate contact time as indicated for disinfecting solutions.

## 2019-03-01 ENCOUNTER — Encounter: Payer: Self-pay | Admitting: Family

## 2019-03-01 DIAGNOSIS — E611 Iron deficiency: Secondary | ICD-10-CM | POA: Insufficient documentation

## 2019-03-01 LAB — IRON: Iron: 33 ug/dL — ABNORMAL LOW (ref 42–145)

## 2019-03-01 LAB — CBC WITH DIFFERENTIAL/PLATELET
Basophils Absolute: 0.1 10*3/uL (ref 0.0–0.1)
Basophils Relative: 1.2 % (ref 0.0–3.0)
Eosinophils Absolute: 0.1 10*3/uL (ref 0.0–0.7)
Eosinophils Relative: 1.4 % (ref 0.0–5.0)
HCT: 40.1 % (ref 36.0–46.0)
Hemoglobin: 12.8 g/dL (ref 12.0–15.0)
Lymphocytes Relative: 20.1 % (ref 12.0–46.0)
Lymphs Abs: 2.1 10*3/uL (ref 0.7–4.0)
MCHC: 31.8 g/dL (ref 30.0–36.0)
MCV: 79.4 fl (ref 78.0–100.0)
Monocytes Absolute: 0.7 10*3/uL (ref 0.1–1.0)
Monocytes Relative: 7 % (ref 3.0–12.0)
Neutro Abs: 7.2 10*3/uL (ref 1.4–7.7)
Neutrophils Relative %: 70.3 % (ref 43.0–77.0)
Platelets: 539 10*3/uL — ABNORMAL HIGH (ref 150.0–400.0)
RBC: 5.05 Mil/uL (ref 3.87–5.11)
RDW: 20.5 % — ABNORMAL HIGH (ref 11.5–15.5)
WBC: 10.3 10*3/uL (ref 4.0–10.5)

## 2019-03-01 LAB — BASIC METABOLIC PANEL
BUN: 14 mg/dL (ref 6–23)
CO2: 27 mEq/L (ref 19–32)
Calcium: 9.4 mg/dL (ref 8.4–10.5)
Chloride: 102 mEq/L (ref 96–112)
Creatinine, Ser: 1.15 mg/dL (ref 0.40–1.20)
GFR: 52.95 mL/min — ABNORMAL LOW (ref >60.00)
Glucose, Bld: 80 mg/dL (ref 70–99)
Potassium: 4.5 mEq/L (ref 3.5–5.1)
Sodium: 137 mEq/L (ref 135–145)

## 2019-03-01 LAB — FERRITIN: Ferritin: 22.9 ng/mL (ref 10.0–291.0)

## 2019-03-23 IMAGING — CT CT HEAD W/O CM
3 series · 16 of 47 positions shown, 19 images · non-contrast
Comparison: None.

CLINICAL DATA: Headache

EXAM:
CT HEAD WITHOUT CONTRAST
TECHNIQUE: Contiguous axial images were obtained from the base of the skull
through the vertex without intravenous contrast.

[Series 2: head wo · axial · 0.47mm/px · z∈[+1533,+1658]mm · 10 of 30 slices shown, 13 images]
[im 3/30  brain]
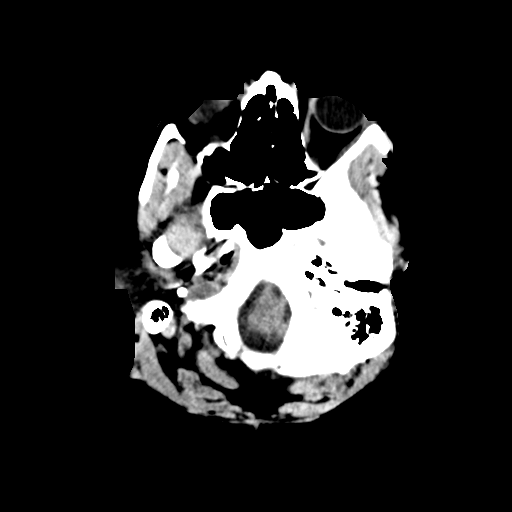
[im 3/30  bone]
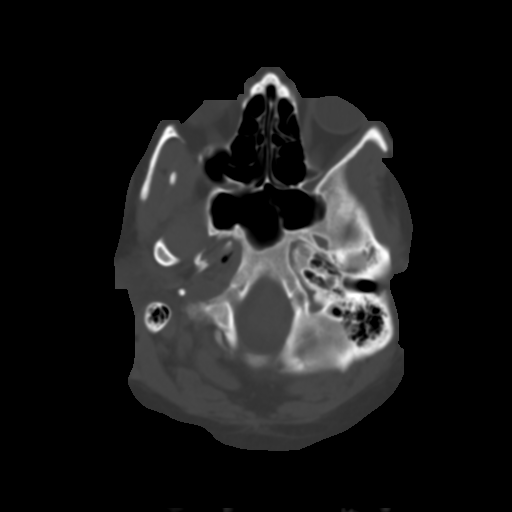
[im 6/30  brain]
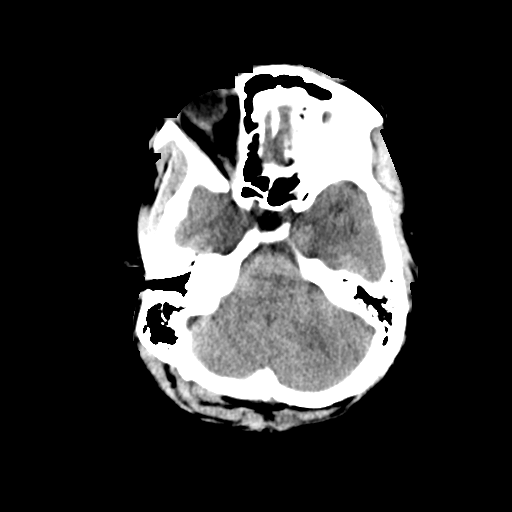
[im 9/30  brain]
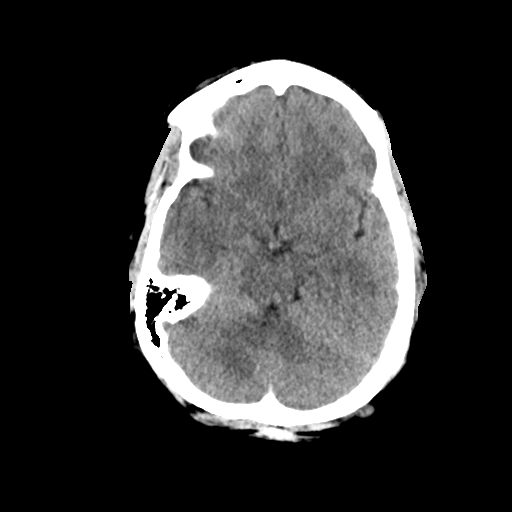
[im 11/30  brain]
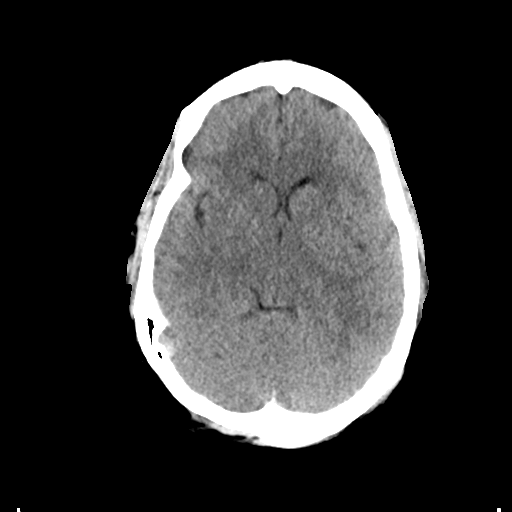
[im 14/30  brain]
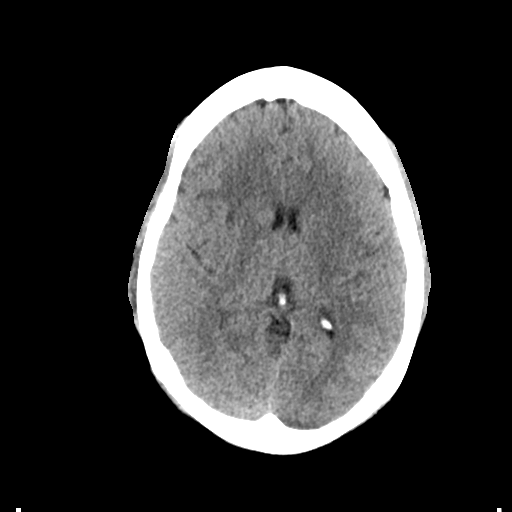
[im 14/30  bone]
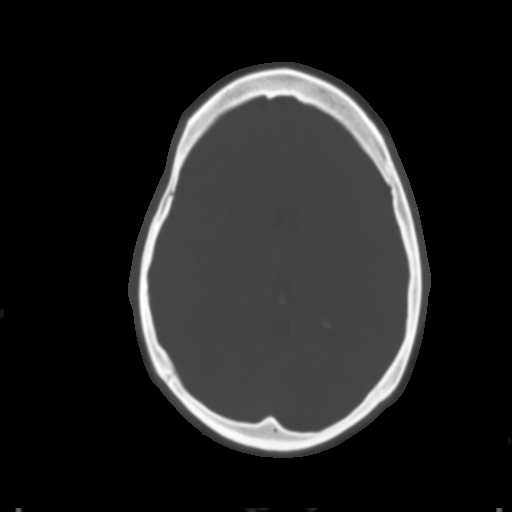
[im 17/30  brain]
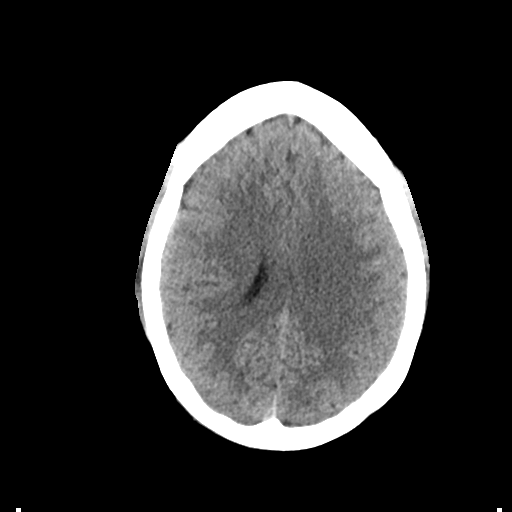
[im 20/30  brain]
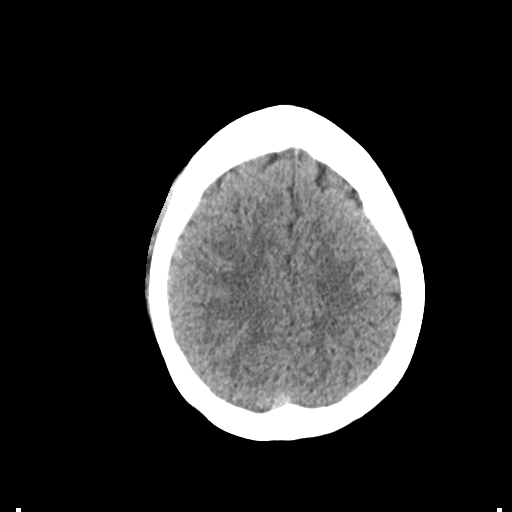
[im 23/30  brain]
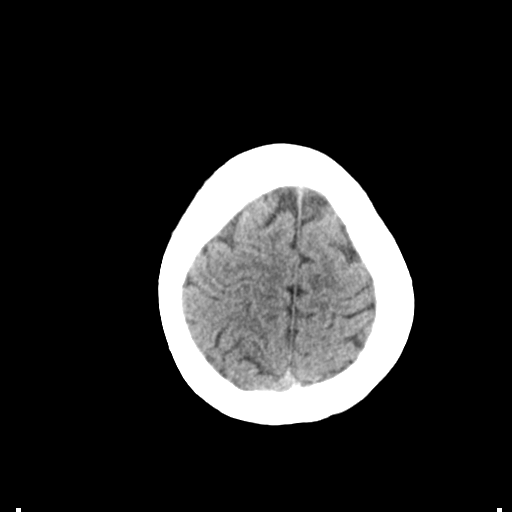
[im 25/30  brain]
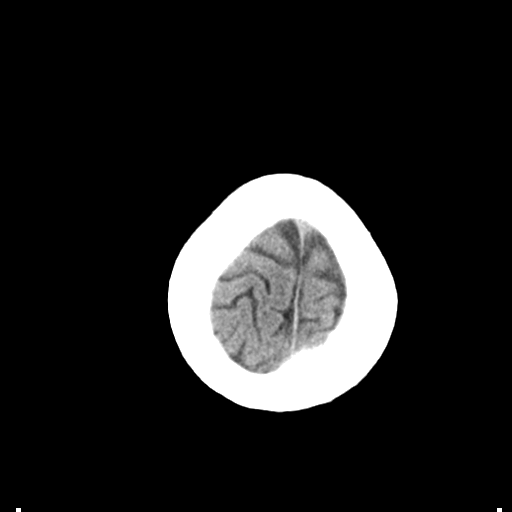
[im 25/30  bone]
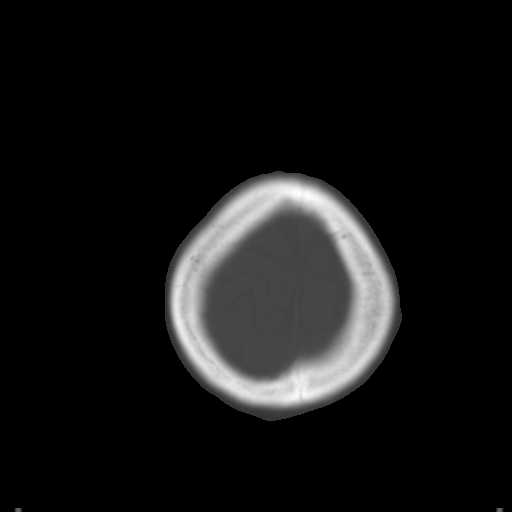
[im 28/30  brain]
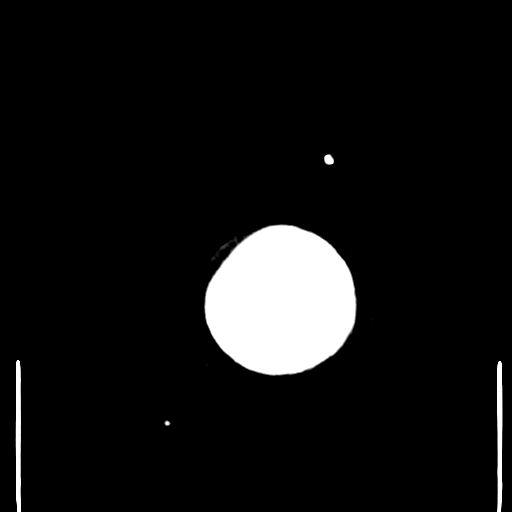

[Series 4: coronal soft tissue · coronal · 0.33mm/px · 3 of 64 slices shown]
[im 22/64  brain]
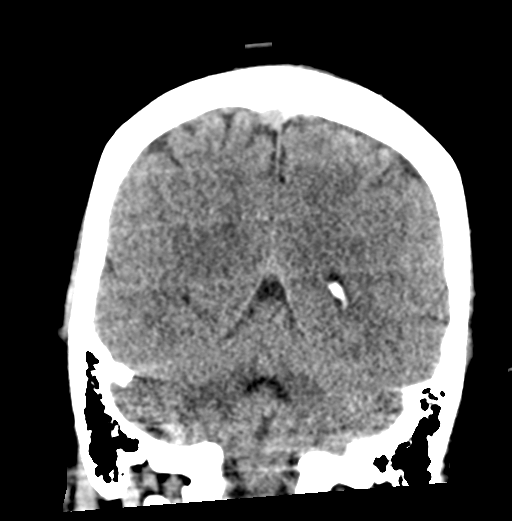
[im 29/64  brain]
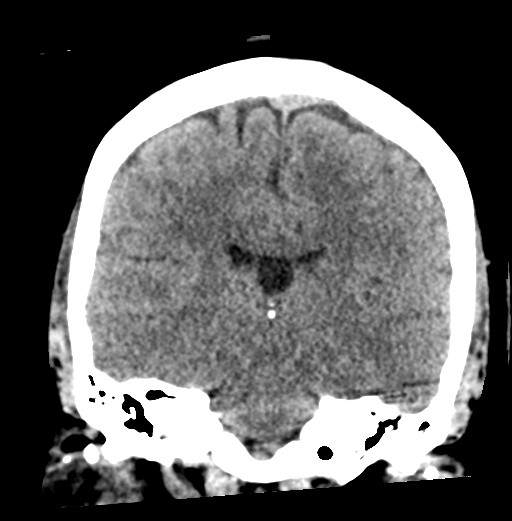
[im 36/64  brain]
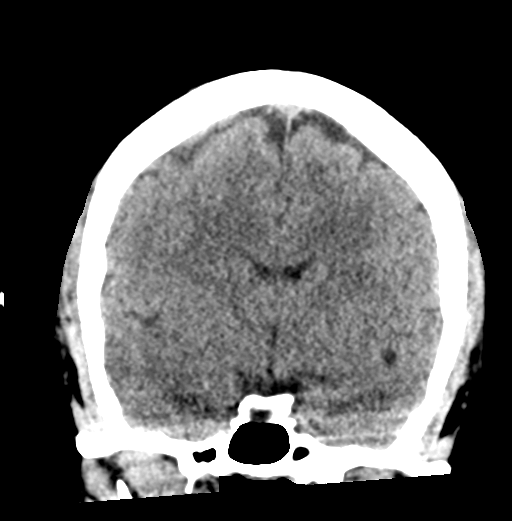

[Series 5: sagittal soft tissue · sagittal · 0.33mm/px · 3 of 57 slices shown]
[im 19/57  brain]
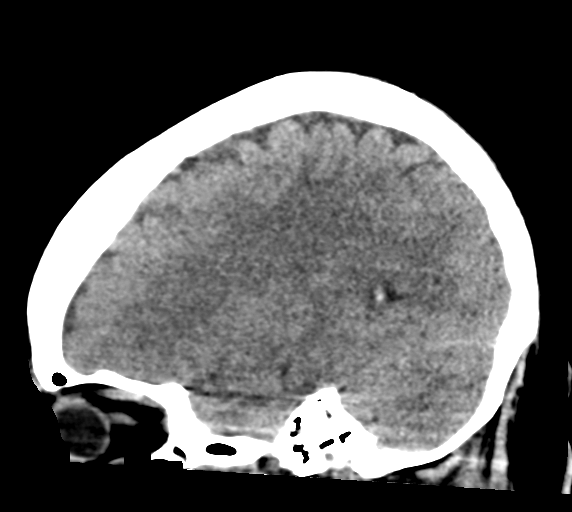
[im 29/57  brain]
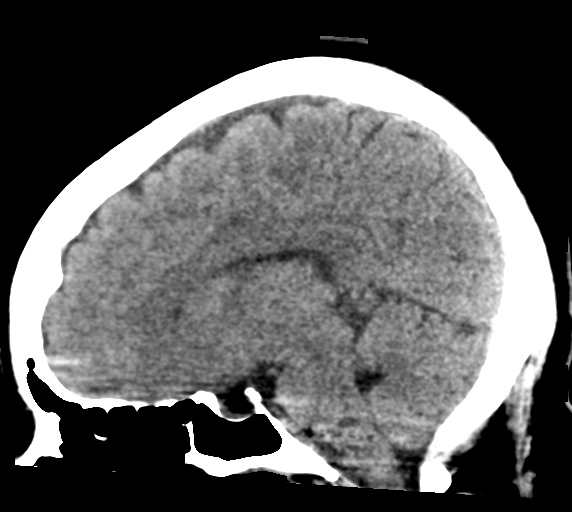
[im 38/57  brain]
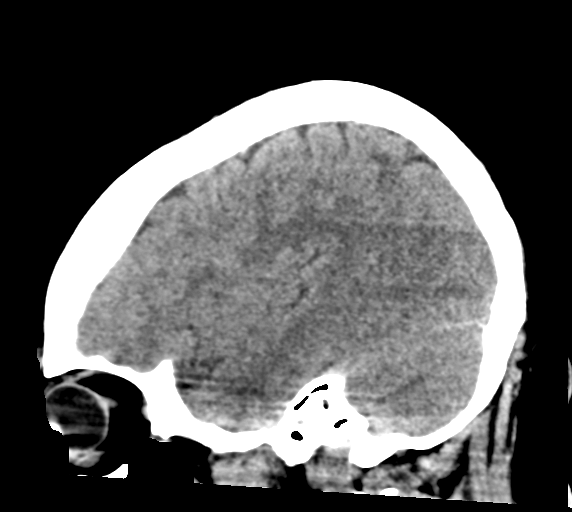

[16 of 47 positions shown; findings below may reference images not displayed]

FINDINGS: Brain: No mass lesion, intraparenchymal hemorrhage or extra-axial
collection. No evidence of acute cortical infarct. Normal appearance
of the brain parenchyma and extra axial spaces for age.

Vascular: No hyperdense vessel or unexpected vascular calcification.

Skull: Normal visualized skull base, calvarium and extracranial soft
tissues.

Sinuses/Orbits: No sinus fluid levels or advanced mucosal
thickening. No mastoid effusion. Normal orbits.
IMPRESSION: Normal head CT.

## 2019-03-23 IMAGING — CR DG CHEST 2V
2 series · 2 of 2 positions shown · non-contrast
Comparison: Report of a chest x-ray July 03, 2009

CLINICAL DATA: Marked hypertension, exertional chest pain previous
history of hypertension which had resolved. Current smoker.

EXAM:
CHEST  2 VIEW

[w chest pa *]
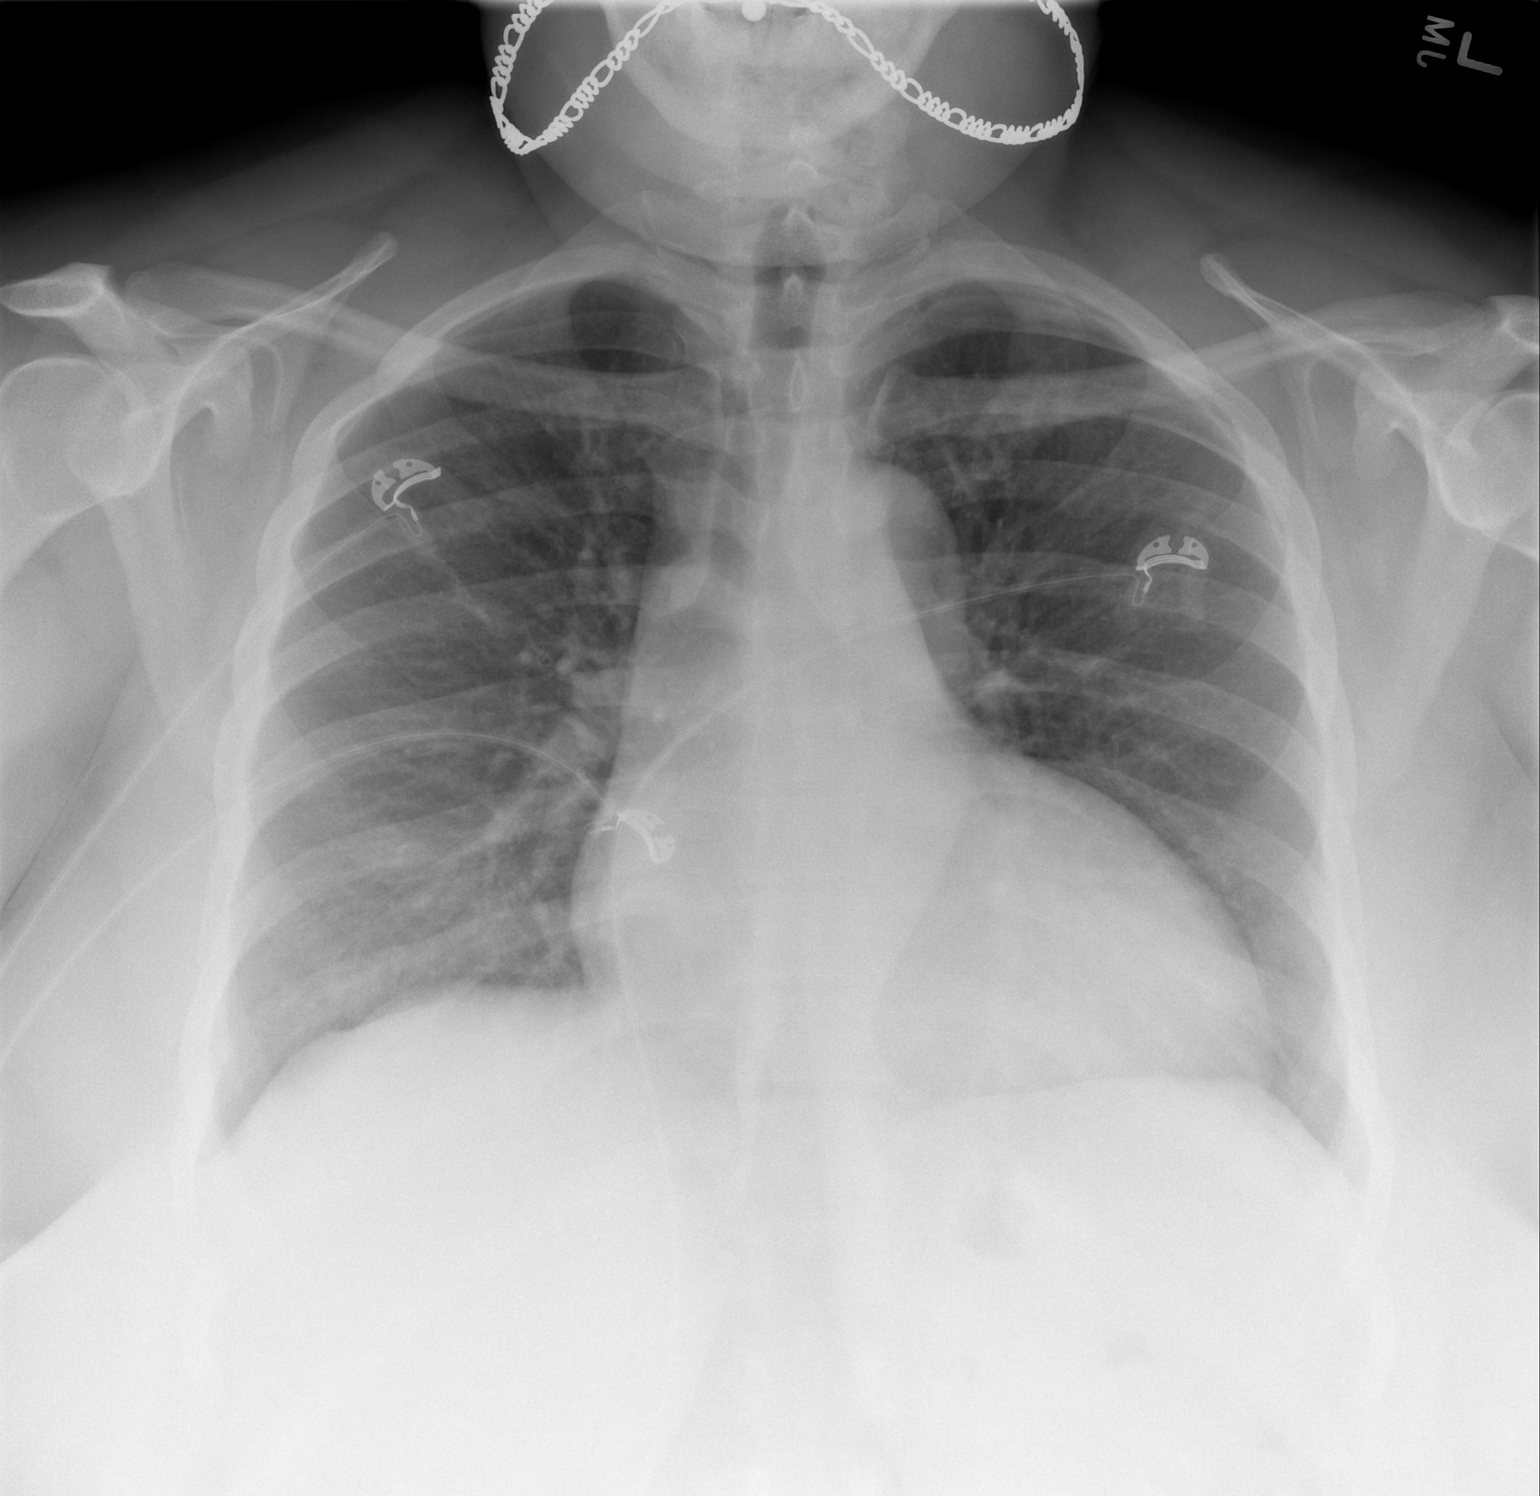

[w chest lat *]
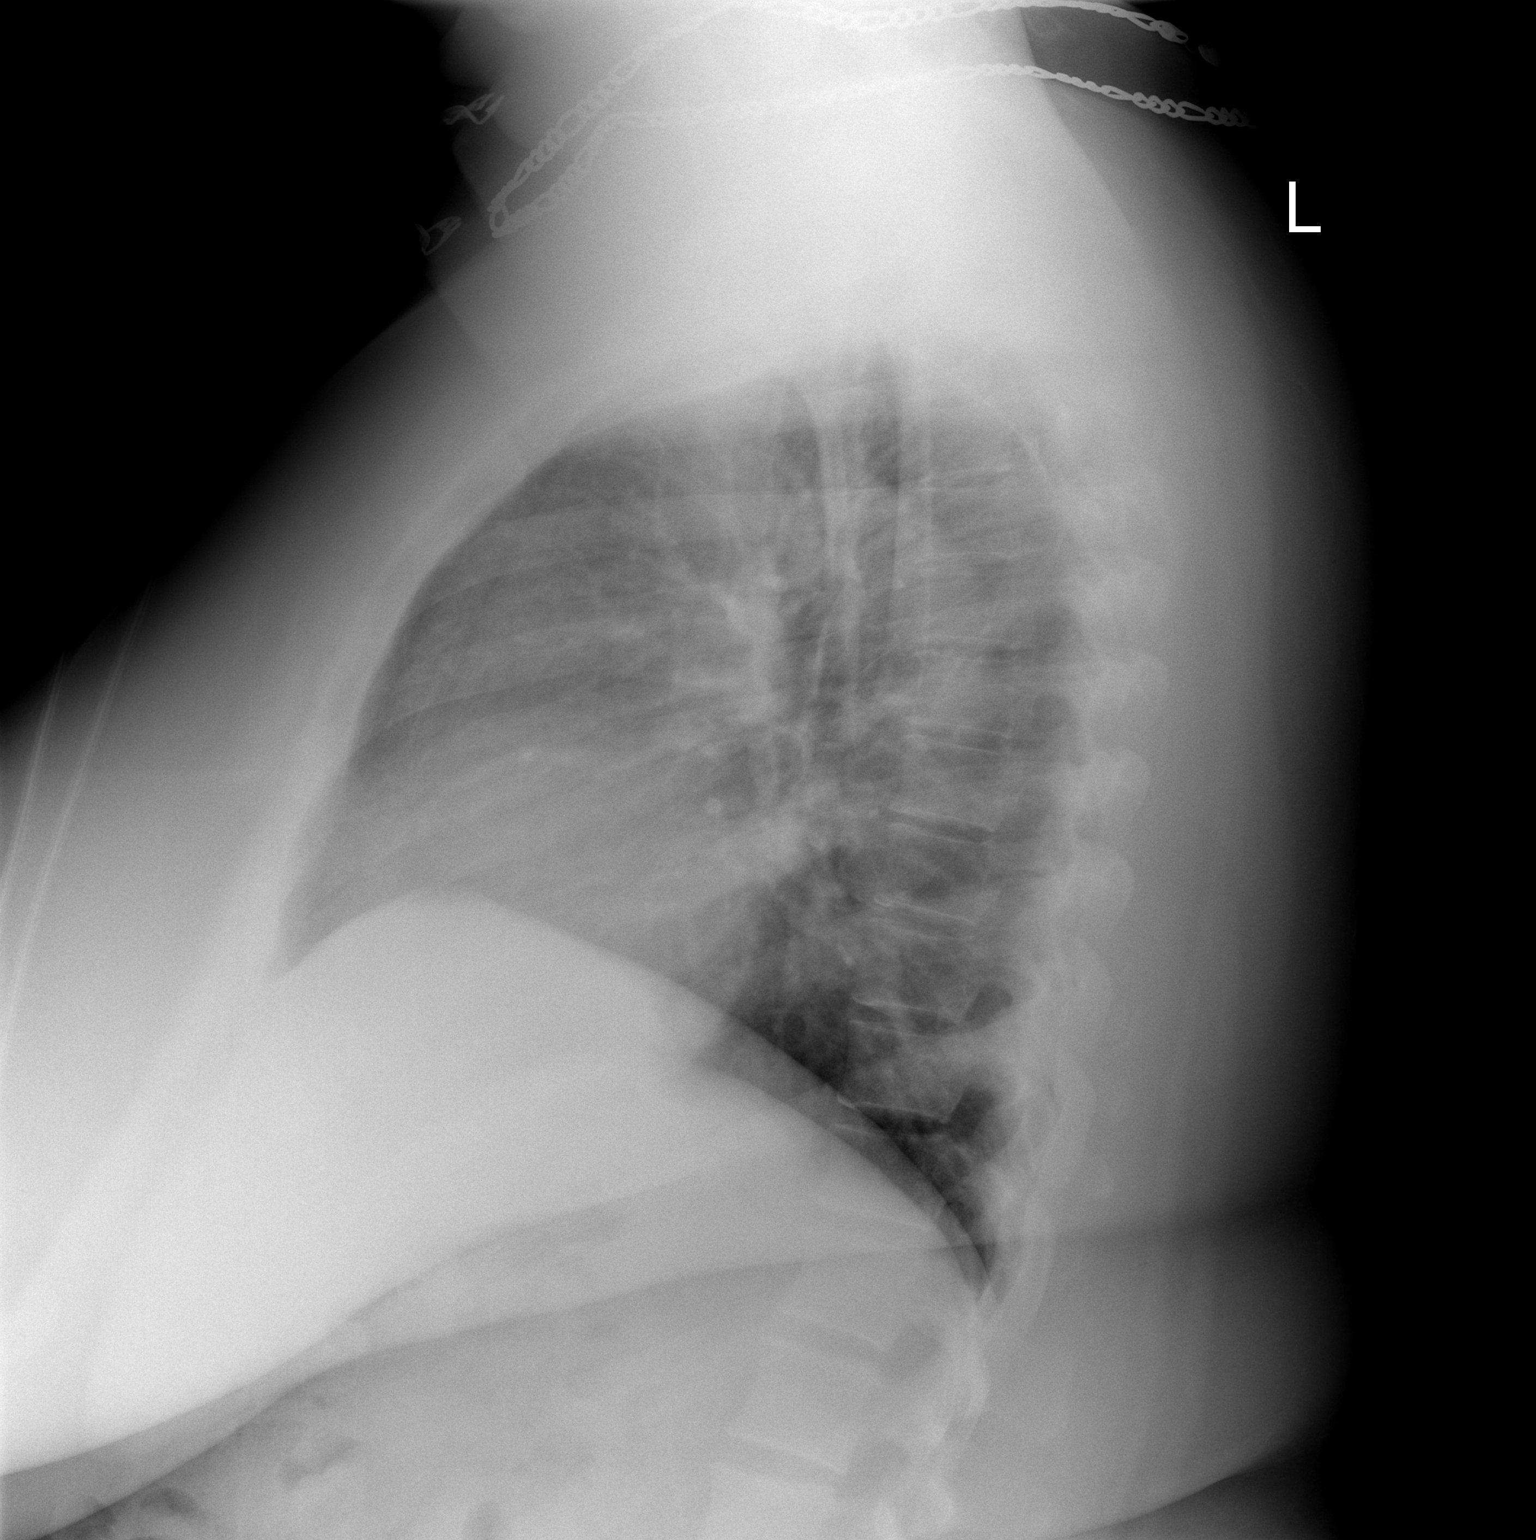

[2 of 2 positions shown; findings below may reference images not displayed]

FINDINGS: The lungs are adequately inflated and clear. The heart is mildly
enlarged. The pulmonary vascularity is normal. The mediastinum is
normal in width. The trachea is midline. There is mild tortuosity of
the descending thoracic aorta. The bony thorax exhibits no acute
abnormality.
IMPRESSION: There is no pneumonia nor other acute cardiopulmonary abnormality.
Cardiomegaly without pulmonary edema.

## 2019-04-05 ENCOUNTER — Ambulatory Visit (INDEPENDENT_AMBULATORY_CARE_PROVIDER_SITE_OTHER): Payer: No Typology Code available for payment source | Admitting: Family

## 2019-04-05 ENCOUNTER — Encounter: Payer: Self-pay | Admitting: Family

## 2019-04-05 ENCOUNTER — Other Ambulatory Visit: Payer: Self-pay

## 2019-04-05 VITALS — BP 122/80 | HR 97 | Temp 97.3°F | Resp 18 | Ht 61.0 in | Wt 299.0 lb

## 2019-04-05 DIAGNOSIS — Z0000 Encounter for general adult medical examination without abnormal findings: Secondary | ICD-10-CM | POA: Diagnosis not present

## 2019-04-05 DIAGNOSIS — Z720 Tobacco use: Secondary | ICD-10-CM | POA: Diagnosis not present

## 2019-04-05 DIAGNOSIS — I10 Essential (primary) hypertension: Secondary | ICD-10-CM

## 2019-04-05 DIAGNOSIS — E611 Iron deficiency: Secondary | ICD-10-CM | POA: Diagnosis not present

## 2019-04-05 LAB — TSH: TSH: 2.29 u[IU]/mL (ref 0.35–4.50)

## 2019-04-05 LAB — LIPID PANEL
Cholesterol: 69 mg/dL (ref 0–200)
HDL: 31.3 mg/dL — ABNORMAL LOW (ref >39.00)
LDL Cholesterol: 10 mg/dL (ref 0–99)
NonHDL: 37.71
Total CHOL/HDL Ratio: 2
Triglycerides: 138 mg/dL (ref 0.0–149.0)
VLDL: 27.6 mg/dL (ref 0.0–40.0)

## 2019-04-05 LAB — HEPATIC FUNCTION PANEL
ALT: 19 U/L (ref 0–35)
AST: 14 U/L (ref 0–37)
Albumin: 3.7 g/dL (ref 3.5–5.2)
Alkaline Phosphatase: 65 U/L (ref 39–117)
Bilirubin, Direct: 0.1 mg/dL (ref 0.0–0.3)
Total Bilirubin: 0.2 mg/dL (ref 0.2–1.2)
Total Protein: 7.5 g/dL (ref 6.0–8.3)

## 2019-04-05 MED ORDER — BUPROPION HCL ER (SR) 100 MG PO TB12: ORAL_TABLET | ORAL | 0 refills | Status: DC

## 2019-04-05 MED ORDER — IRON 325 (65 FE) MG PO TABS: 1.0000 | ORAL_TABLET | Freq: Two times a day (BID) | ORAL | 0 refills | Status: DC

## 2019-04-05 NOTE — Progress Notes (Signed)
Subjective:    Patient ID: Cristina Adkins, female    DOB: 1980/07/23, 39 y.o.   MRN: [id number]  HPI  Patient presents today for complete physical.  Immunizations:  Believes tha there last tetanus was in 2017 Diet:  Needs improvment Exercise: staying active caring for her mom Wt Readings from Last 3 Encounters:  04/05/19 299 lb (135.6 kg)  02/28/19 298 lb (135.2 kg)  01/02/19 290 lb (131.5 kg)  Pap Smear: unsure, declines today   HTN- maintained on lisinopril, hctz and amlodipine.  BP Readings from Last 3 Encounters:  04/05/19 122/80  02/28/19 (!) 153/105  01/02/19 (!) 157/120   Tobacco abuse-she is interested in quitting smoking.  She would like a medication to assist in smoking cessation.  Review of Systems  Constitutional: Negative for unexpected weight change.  HENT: Negative for hearing loss and rhinorrhea.   Eyes: Negative for visual disturbance.  Respiratory: Negative for cough.   Cardiovascular: Negative for leg swelling.  Gastrointestinal: Negative for constipation and diarrhea.  Genitourinary: Negative for dysuria and frequency.  Musculoskeletal: Negative for arthralgias and myalgias.  Skin: Negative for rash.  Neurological: Negative for headaches (occasional HA's).  Hematological: Negative for adenopathy.  Psychiatric/Behavioral:       Notes some life stress overall mood is OK   Past Medical History:  Diagnosis Date  . Hypertension   . Hypokalemia   . Iron deficiency      Social History   Socioeconomic History  . Marital status: Single    Spouse name: Not on file  . Number of children: 1  . Years of education: Not on file  . Highest education level: Not on file  Occupational History  . Occupation: Market researcher  Tobacco Use  . Smoking status: Current Every Day Smoker    Packs/day: 1.00    Years: 9.00    Pack years: 9.00    Types: Cigarettes  . Smokeless tobacco: Never Used  Substance and Sexual Activity  . Alcohol use: No  . Drug  use: No  . Sexual activity: Yes    Partners: Male    Birth control/protection: None  Other Topics Concern  . Not on file  Social History Narrative   Grandmother is irish/indian   2 half-sisters   2 sisters (full),    1/2 brother   Older brother same mom- brother has CP   3 1/2 younger brothers after me   1 daughter    Boyfriend   Works as a Psychologist, forensic   Completed associate degree in Proofreader   No pets   Enjoys Barrister's clerk   Social Determinants of Radio broadcast assistant Strain:   . Difficulty of Paying Living Expenses: Not on file  Food Insecurity:   . Worried About Charity fundraiser in the Last Year: Not on file  . Ran Out of Food in the Last Year: Not on file  Transportation Needs:   . Lack of Transportation (Medical): Not on file  . Lack of Transportation (Non-Medical): Not on file  Physical Activity:   . Days of Exercise per Week: Not on file  . Minutes of Exercise per Session: Not on file  Stress:   . Feeling of Stress : Not on file  Social Connections:   . Frequency of Communication with Friends and Family: Not on file  . Frequency of Social Gatherings with Friends and Family: Not on file  . Attends Religious Services: Not on file  . Active Member of  Clubs or Organizations: Not on file  . Attends Archivist Meetings: Not on file  . Marital Status: Not on file  Intimate Partner Violence:   . Fear of Current or Ex-Partner: Not on file  . Emotionally Abused: Not on file  . Physically Abused: Not on file  . Sexually Abused: Not on file    No past surgical history on file.  Family History  Problem Relation Age of Onset  . Hypertension Mother   . Hypertension Maternal Grandmother   . Diabetes Daughter   . Lung cancer Daughter   . Heart disease Maternal Uncle   . Diabetes Maternal Uncle   . Colon cancer Maternal Uncle     Allergies  Allergen Reactions  . Latex     Irritated and burning skin  . Other      Allergic to all fruit and raw tomatoes reaction face swells    Current Outpatient Medications on File Prior to Visit  Medication Sig Dispense Refill  . amLODipine (NORVASC) 5 MG tablet Take 1 tablet (5 mg total) by mouth daily. 90 tablet 0  . Blood Pressure Monitoring (ADULT BLOOD PRESSURE CUFF LG) KIT 1 Units by Does not apply route daily. 1 each 0  . hydrochlorothiazide (HYDRODIURIL) 25 MG tablet Take 1 tablet (25 mg total) by mouth daily. 90 tablet 0  . lisinopril (ZESTRIL) 10 MG tablet Take 1 tablet (10 mg total) by mouth daily. 90 tablet 0  . furosemide (LASIX) 20 MG tablet Take 1 tablet (20 mg total) by mouth daily for 3 days. Take with potassium supplement. 3 tablet 0   No current facility-administered medications on file prior to visit.    BP 122/80   Pulse 97   Temp (!) 97.3 F (36.3 C) (Temporal)   Resp 18   Ht 5' 1"  (1.549 m)   Wt 299 lb (135.6 kg)   SpO2 100%   BMI 56.50 kg/m         Objective:   Physical Exam  Physical Exam  Constitutional: She is oriented to person, place, and time. She appears well-developed and well-nourished/morbidly obese. No distress.  HENT:  Head: Normocephalic and atraumatic.  Right Ear: Tympanic membrane and ear canal normal.  Left Ear: Tympanic membrane and ear canal normal.  Mouth/Throat: Not examined, patient wearing mask Eyes: Pupils are equal, round, and reactive to light. No scleral icterus.  Neck: Normal range of motion. No thyromegaly present.  Cardiovascular: Normal rate and regular rhythm.   No murmur heard. Pulmonary/Chest: Effort normal and breath sounds normal. No respiratory distress. He has no wheezes. She has no rales. She exhibits no tenderness.  Abdominal: Soft. Bowel sounds are normal. She exhibits no distension and no mass. There is no tenderness. There is no rebound and no guarding.  Musculoskeletal: She exhibits no edema.  Lymphadenopathy:    She has no cervical adenopathy.  Neurological: She is alert and  oriented to person, place, and time. She has normal patellar reflexes. She exhibits normal muscle tone. Coordination normal.  Skin: Skin is warm and dry.  Psychiatric: She has a normal mood and affect. Her behavior is normal. Judgment and thought content normal.  Breast pelvic: Deferred       Assessment & Plan:   Preventive care-I gave her the number for the healthy weight and wellness clinic to call and schedule an appointment.  We discussed healthy diet exercise and weight loss.  Pap smear is due.  Will refer to gynecology.  Hypertension-initial blood  pressure was elevated today.  Follow-up blood pressure was much improved.  We will continue current medications.  She had taken her a.m. medication just before she arrived.  Tobacco abuse-we will add Wellbutrin to assist with weight loss and tobacco cessation.  Iron deficiency-will obtain follow-up CBC.  We will also add iron supplement twice daily.  She does get some constipation with her iron supplements I suggested as needed Colace.  This visit occurred during the SARS-CoV-2 public health emergency.  Safety protocols were in place, including screening questions prior to the visit, additional usage of staff PPE, and extensive cleaning of exam room while observing appropriate contact time as indicated for disinfecting solutions.        Assessment & Plan:

## 2019-04-05 NOTE — Patient Instructions (Addendum)
[facility] [phone]. Please add iron 325mg  twice daily. You may add a stool softener such as colace if needed for constipation.  Add Wellbutrin to help you quit smoking

## 2019-04-10 ENCOUNTER — Telehealth: Payer: Self-pay | Admitting: Family

## 2019-04-10 DIAGNOSIS — N63 Unspecified lump in unspecified breast: Secondary | ICD-10-CM

## 2019-04-10 NOTE — Telephone Encounter (Signed)
See order(s).

## 2019-05-09 ENCOUNTER — Other Ambulatory Visit: Payer: Self-pay

## 2019-05-10 ENCOUNTER — Ambulatory Visit (INDEPENDENT_AMBULATORY_CARE_PROVIDER_SITE_OTHER): Payer: No Typology Code available for payment source | Admitting: Family

## 2019-05-10 ENCOUNTER — Encounter: Payer: Self-pay | Admitting: Family

## 2019-05-10 VITALS — BP 160/111 | HR 82 | Temp 96.6°F | Resp 19 | Ht 61.0 in | Wt 303.0 lb

## 2019-05-10 DIAGNOSIS — I10 Essential (primary) hypertension: Secondary | ICD-10-CM

## 2019-05-10 DIAGNOSIS — D509 Iron deficiency anemia, unspecified: Secondary | ICD-10-CM

## 2019-05-10 LAB — BASIC METABOLIC PANEL
BUN: 14 mg/dL (ref 6–23)
CO2: 27 mEq/L (ref 19–32)
Calcium: 9.3 mg/dL (ref 8.4–10.5)
Chloride: 102 mEq/L (ref 96–112)
Creatinine, Ser: 1.07 mg/dL (ref 0.40–1.20)
GFR: 57.49 mL/min — ABNORMAL LOW (ref >60.00)
Glucose, Bld: 115 mg/dL — ABNORMAL HIGH (ref 70–99)
Potassium: 4.1 mEq/L (ref 3.5–5.1)
Sodium: 136 mEq/L (ref 135–145)

## 2019-05-10 MED ORDER — IRON 325 (65 FE) MG PO TABS: 1.0000 | ORAL_TABLET | ORAL | 0 refills | Status: DC

## 2019-05-10 MED ORDER — AMLODIPINE BESYLATE 10 MG PO TABS: 10.0000 mg | ORAL_TABLET | Freq: Every day | ORAL | 1 refills | Status: DC

## 2019-05-10 NOTE — Patient Instructions (Addendum)
Please increase amlodipine to 10mg  once daily. Complete lab work prior to leaving.c

## 2019-05-10 NOTE — Telephone Encounter (Signed)
Error, orders removed.

## 2019-05-10 NOTE — Progress Notes (Signed)
Subjective:    Patient ID: Cristina Adkins, female    DOB: Nov 27, 1980, 39 y.o.   MRN: [id number]  HPI  Patient is a 39 yr old female who presents today for follow up.   HTN- currently maintained on amlodipine 50m, hctz 26m lisinopril 105m BP Readings from Last 3 Encounters:  05/10/19 (!) 160/111  04/05/19 122/80  02/28/19 (!) 153/105    Iron deficiency anemia- reports that she has been taking iron but has been constipated.   Review of Systems See HPI  Past Medical History:  Diagnosis Date  . Hypertension   . Hypokalemia   . Iron deficiency      Social History   Socioeconomic History  . Marital status: Single    Spouse name: Not on file  . Number of children: 1  . Years of education: Not on file  . Highest education level: Not on file  Occupational History  . 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 Smoking status: Current Every Day Smoker    Packs/day: 1.00    Years: 9.00    Pack years: 9.00    Types: Cigarettes  . Smokeless tobacco: Never Used  Substance and Sexual Activity  . Alcohol use: No  . Drug use: No  . Sexual activity: Yes    Partners: Male    Birth control/protection: None  Other Topics Concern  . Not on file  Social History Narrative   Grandmother is irish/indian   2 half-sisters   2 sisters (full),    1/2 brother   Older brother same mom- brother has CP   3 1/2 younger brothers after me   1 daughter    Boyfriend   Works as a claPsychologist, forensicCompleted associate degree in busProofreaderNo pets   Enjoys craBarrister's clerkSocial Determinants of HeaRadio broadcast assistantrain:   . Difficulty of Paying Living Expenses:   Food Insecurity:   . Worried About RunCharity fundraiser the Last Year:   . RanArboriculturist the Last Year:   Transportation Needs:   . LacFilm/video editoredical):   . LMarland Kitchenck of Transportation (Non-Medical):   Physical Activity:   . Days of Exercise per Week:   . Minutes  of Exercise per Session:   Stress:   . Feeling of Stress :   Social Connections:   . Frequency of Communication with Friends and Family:   . Frequency of Social Gatherings with Friends and Family:   . Attends Religious Services:   . Active Member of Clubs or Organizations:   . Attends CluArchivistetings:   . MMarland Kitchenrital Status:   Intimate Partner Violence:   . Fear of Current or Ex-Partner:   . Emotionally Abused:   . PMarland Kitchenysically Abused:   . Sexually Abused:     No past surgical history on file.  Family History  Problem Relation Age of Onset  . Hypertension Mother   . Hypertension Maternal Grandmother   . Diabetes Daughter   . Lung cancer Daughter   . Heart disease Maternal Uncle   . Diabetes Maternal Uncle   . Colon cancer Maternal Uncle     Allergies  Allergen Reactions  . Latex     Irritated and burning skin  . Other     Allergic to all fruit and raw tomatoes reaction face swells    Current Outpatient Medications on File Prior to Visit  Medication  Sig Dispense Refill  . Blood Pressure Monitoring (ADULT BLOOD PRESSURE CUFF LG) KIT 1 Units by Does not apply route daily. 1 each 0  . buPROPion (WELLBUTRIN SR) 100 MG 12 hr tablet Take 1 tablet by mouth once daily for 3 days then increase to 1 tablet twice daily 60 tablet 0  . hydrochlorothiazide (HYDRODIURIL) 25 MG tablet Take 1 tablet (25 mg total) by mouth daily. 90 tablet 0  . lisinopril (ZESTRIL) 10 MG tablet Take 1 tablet (10 mg total) by mouth daily. 90 tablet 0  . furosemide (LASIX) 20 MG tablet Take 1 tablet (20 mg total) by mouth daily for 3 days. Take with potassium supplement. 3 tablet 0   No current facility-administered medications on file prior to visit.    BP (!) 160/111 (BP Location: Right Arm, Patient Position: Sitting, Cuff Size: Large)   Pulse 82   Temp (!) 96.6 F (35.9 C) (Temporal)   Resp 19   Ht _0  (1.549 m)   Wt (!) 303 lb (137.4 kg)   SpO2 98%   BMI 57.25 kg/m        Objective:   Physical Exam Constitutional:      Appearance: She is well-developed.  Neck:     Thyroid: No thyromegaly.  Cardiovascular:     Rate and Rhythm: Normal rate and regular rhythm.     Heart sounds: Normal heart sounds. No murmur.  Pulmonary:     Effort: Pulmonary effort is normal. No respiratory distress.     Breath sounds: Normal breath sounds. No wheezing.  Musculoskeletal:     Cervical back: Neck supple.  Skin:    General: Skin is warm and dry.  Neurological:     Mental Status: She is alert and oriented to person, place, and time.  Psychiatric:        Behavior: Behavior normal.        Thought Content: Thought content normal.        Judgment: Judgment normal.           Assessment & Plan:  HTN- uncontrolled. Will increase amlodipine from 86m to 154m  Obtain follow up bmet. Pt is advised as follows:    Check blood pressure once daily for 1 week and send me your readings via mychart. Goal blood pressure is <140/90.  Call if top number less than 100.   Iron deficiency anemia- will change iron to 32552mO QOD to see if she better tolerates this regimen.   This visit occurred during the SARS-CoV-2 public health emergency.  Safety protocols were in place, including screening questions prior to the visit, additional usage of staff PPE, and extensive cleaning of exam room while observing appropriate contact time as indicated for disinfecting solutions.

## 2019-05-10 NOTE — Addendum Note (Signed)
Addended by: Sandford Craze on: 05/10/2019 07:50 AM   Modules accepted: Orders

## 2019-06-09 ENCOUNTER — Other Ambulatory Visit: Payer: Self-pay

## 2019-06-09 ENCOUNTER — Encounter: Payer: Self-pay | Admitting: Family

## 2019-06-09 ENCOUNTER — Ambulatory Visit (INDEPENDENT_AMBULATORY_CARE_PROVIDER_SITE_OTHER): Payer: No Typology Code available for payment source | Admitting: Family

## 2019-06-09 VITALS — BP 145/106 | HR 92 | Temp 97.9°F | Resp 18 | Ht 61.0 in | Wt 300.0 lb

## 2019-06-09 DIAGNOSIS — E611 Iron deficiency: Secondary | ICD-10-CM | POA: Diagnosis not present

## 2019-06-09 DIAGNOSIS — D649 Anemia, unspecified: Secondary | ICD-10-CM | POA: Diagnosis not present

## 2019-06-09 MED ORDER — OLMESARTAN-AMLODIPINE-HCTZ 40-5-25 MG PO TABS: 1.0000 | ORAL_TABLET | Freq: Every day | ORAL | 0 refills | Status: DC

## 2019-06-09 NOTE — Progress Notes (Signed)
Subjective:    Patient ID: Cristina Adkins, female    DOB: 21-Jan-1981, 39 y.o.   MRN: [id number]  HPI   HTN- maintained on amlodipine 10mg , lisinopril , hctz 25mg . Reports good compliance with medication. Reports home readings range as follows: 130-156/85-94  BP Readings from Last 3 Encounters:  06/09/19 (!) 145/106  05/10/19 (!) 160/111  04/05/19 122/80   Iron deficiency anemia- using QOD iron. Constipation has improved but has not resolved.     Review of Systems See HPI  Past Medical History:  Diagnosis Date  . Hypertension   . Hypokalemia   . Iron deficiency      Social History   Socioeconomic History  . Marital status: Single    Spouse name: Not on file  . Number of children: 1  . Years of education: Not on file  . Highest education level: Not on file  Occupational History  . Occupation: 05/12/19  Tobacco Use  . Smoking status: Current Every Day Smoker    Packs/day: 1.00    Years: 9.00    Pack years: 9.00    Types: Cigarettes  . Smokeless tobacco: Never Used  Substance and Sexual Activity  . Alcohol use: No  . Drug use: No  . Sexual activity: Yes    Partners: Male    Birth control/protection: None  Other Topics Concern  . Not on file  Social History Narrative   Grandmother is irish/indian   2 half-sisters   2 sisters (full),    1/2 brother   Older brother same mom- brother has CP   3 1/2 younger brothers after me   1 daughter    Boyfriend   Works as a 06/05/19   Completed associate degree in Advice worker   No pets   Enjoys Futures trader   Social Determinants of Theatre manager Strain:   . Difficulty of Paying Living Expenses:   Food Insecurity:   . Worried About Clinical cytogeneticist in the Last Year:   . Corporate investment banker in the Last Year:   Transportation Needs:   . Programme researcher, broadcasting/film/video (Medical):   Barista Lack of Transportation (Non-Medical):   Physical Activity:   . Days of Exercise per  Week:   . Minutes of Exercise per Session:   Stress:   . Feeling of Stress :   Social Connections:   . Frequency of Communication with Friends and Family:   . Frequency of Social Gatherings with Friends and Family:   . Attends Religious Services:   . Active Member of Clubs or Organizations:   . Attends Freight forwarder Meetings:   Marland Kitchen Marital Status:   Intimate Partner Violence:   . Fear of Current or Ex-Partner:   . Emotionally Abused:   Banker Physically Abused:   . Sexually Abused:     No past surgical history on file.  Family History  Problem Relation Age of Onset  . Hypertension Mother   . Hypertension Maternal Grandmother   . Diabetes Daughter   . Lung cancer Daughter   . Heart disease Maternal Uncle   . Diabetes Maternal Uncle   . Colon cancer Maternal Uncle     Allergies  Allergen Reactions  . Latex     Irritated and burning skin  . Other     Allergic to all fruit and raw tomatoes reaction face swells    Current Outpatient Medications on File Prior to Visit  Medication Sig Dispense Refill  .  amLODipine (NORVASC) 10 MG tablet Take 1 tablet (10 mg total) by mouth daily. 90 tablet 1  . buPROPion (WELLBUTRIN SR) 100 MG 12 hr tablet Take 1 tablet by mouth once daily for 3 days then increase to 1 tablet twice daily 60 tablet 0  . Ferrous Sulfate (IRON) 325 (65 Fe) MG TABS Take 1 tablet (325 mg total) by mouth every other day. 30 tablet 0  . hydrochlorothiazide (HYDRODIURIL) 25 MG tablet Take 1 tablet (25 mg total) by mouth daily. 90 tablet 0  . lisinopril (ZESTRIL) 10 MG tablet Take 1 tablet (10 mg total) by mouth daily. 90 tablet 0   No current facility-administered medications on file prior to visit.    BP (!) 145/106 (BP Location: Right Arm, Patient Position: Sitting, Cuff Size: Large)   Pulse 92   Temp 97.9 F (36.6 C) (Temporal)   Resp 18   Ht 5\' 1"  (1.549 m)   Wt 300 lb (136.1 kg)   SpO2 98%   BMI 56.68 kg/m       Objective:   Physical  Exam Constitutional:      Appearance: She is well-developed.  Neck:     Thyroid: No thyromegaly.  Cardiovascular:     Rate and Rhythm: Normal rate and regular rhythm.     Heart sounds: Normal heart sounds. No murmur.  Pulmonary:     Effort: Pulmonary effort is normal. No respiratory distress.     Breath sounds: Normal breath sounds. No wheezing.  Musculoskeletal:     Cervical back: Neck supple.  Skin:    General: Skin is warm and dry.  Neurological:     Mental Status: She is alert and oriented to person, place, and time.  Psychiatric:        Behavior: Behavior normal.        Thought Content: Thought content normal.        Judgment: Judgment normal.           Assessment & Plan:  HTN- uncontrolled. Will d/c amlodipine/hctz/lisinopril.  Change to tribenzor.  Follow up in 1 week.  Iron deficiency anemia- still having some constipation with the QOD dosing regimen.  Advised pt to add colace one cap with each iron dose.   This visit occurred during the SARS-CoV-2 public health emergency.  Safety protocols were in place, including screening questions prior to the visit, additional usage of staff PPE, and extensive cleaning of exam room while observing appropriate contact time as indicated for disinfecting solutions.

## 2019-06-09 NOTE — Patient Instructions (Signed)
Stop lisinopril, hct and amlodipine. Start tribenzor.

## 2019-06-16 ENCOUNTER — Ambulatory Visit: Payer: No Typology Code available for payment source | Admitting: Family

## 2019-06-16 DIAGNOSIS — Z0289 Encounter for other administrative examinations: Secondary | ICD-10-CM

## 2019-08-16 ENCOUNTER — Encounter: Payer: Self-pay | Admitting: Family

## 2019-08-17 MED ORDER — OLMESARTAN-AMLODIPINE-HCTZ 40-5-25 MG PO TABS: 1.0000 | ORAL_TABLET | Freq: Every day | ORAL | 0 refills | Status: DC

## 2019-08-21 MED ORDER — OLMESARTAN-AMLODIPINE-HCTZ 40-5-25 MG PO TABS: 1.0000 | ORAL_TABLET | Freq: Every day | ORAL | 0 refills | Status: DC

## 2019-08-21 NOTE — Addendum Note (Signed)
Addended byConrad Goldfield D on: 08/21/2019 10:57 AM   Modules accepted: Orders

## 2019-08-24 ENCOUNTER — Other Ambulatory Visit: Payer: Self-pay

## 2019-08-24 ENCOUNTER — Ambulatory Visit: Payer: No Typology Code available for payment source | Admitting: Family

## 2019-08-24 MED ORDER — OLMESARTAN-AMLODIPINE-HCTZ 40-5-25 MG PO TABS: 1.0000 | ORAL_TABLET | Freq: Every day | ORAL | 0 refills | Status: DC

## 2019-08-24 NOTE — Addendum Note (Signed)
Addended by: Wilford Corner on: 08/24/2019 08:12 AM   Modules accepted: Orders

## 2019-09-13 ENCOUNTER — Encounter: Payer: Self-pay | Admitting: Family

## 2019-09-13 ENCOUNTER — Other Ambulatory Visit: Payer: Self-pay

## 2019-09-13 ENCOUNTER — Telehealth (INDEPENDENT_AMBULATORY_CARE_PROVIDER_SITE_OTHER): Payer: No Typology Code available for payment source | Admitting: Family

## 2019-09-13 DIAGNOSIS — F329 Major depressive disorder, single episode, unspecified: Secondary | ICD-10-CM | POA: Diagnosis not present

## 2019-09-13 DIAGNOSIS — F32A Depression, unspecified: Secondary | ICD-10-CM

## 2019-09-13 DIAGNOSIS — B349 Viral infection, unspecified: Secondary | ICD-10-CM

## 2019-09-13 DIAGNOSIS — Z720 Tobacco use: Secondary | ICD-10-CM | POA: Diagnosis not present

## 2019-09-13 MED ORDER — OLMESARTAN MEDOXOMIL-HCTZ 40-25 MG PO TABS: 1.0000 | ORAL_TABLET | Freq: Every day | ORAL | 1 refills | Status: DC

## 2019-09-13 MED ORDER — MELOXICAM 7.5 MG PO TABS: 7.5000 mg | ORAL_TABLET | Freq: Every day | ORAL | 0 refills | Status: DC

## 2019-09-13 NOTE — Progress Notes (Signed)
Virtual Visit via Video Note  I connected with Shamar Gamel on 09/13/19 at 12:00 PM EDT by a video enabled telemedicine application and verified that I am speaking with the correct person using two identifiers.  Location: Patient: parked car Provider: work   I discussed the limitations of evaluation and management by telemedicine and the availability of in person appointments. The patient expressed understanding and agreed to proceed.  History of Present Illness:  Patient is a 39 yr old female who presents today for follow up.  HTN- Last visit we changed her medication to tribenzor. Reports that pharmacy is giving her amlodipine separately. Reports home bp readings have been high because she "has been off the pills."   BP Readings from Last 3 Encounters:  06/09/19 (!) 145/106  05/10/19 (!) 160/111  04/05/19 122/80   Reports runny nose the last 3 days. Saw her brother Tuesday of last week. He had nasal congestion and he tested positive for covid on Saturday.  She has not been vaccinated.   [facility].   Tobacco abuse- took wellbutrin- did not help. Stopped.    Feeling unhappy, working with a therapist. Had SI back in may once- none now.  Trouble motivating at times.  Can go days without sleeping.  Reports mind is racing during this time.  Denies spending sprees.  Sometimes she has increased energy.       Observations/Objective:   Gen: Awake, alert, no acute distress Resp: Breathing is even and non-labored Psych: calm/pleasant demeanor Neuro: Alert and Oriented x 3, + facial symmetry, speech is clear.   Assessment and Plan:  Depression- I am concerned about the possibility of Bipolar Disorder.  I have advised her to schedule an appointment with psychiatry.  Viral illness- high suspicion for covid-19.  Advised pt to be tested for covid-19 and to quarantine pending review of results.  Tobacco abuse- we discussed chantix.  I would like for her to  get her mental health issues addressed before we add chantix due to risk of worsening depression. She did not find any benefit from bupropion.     Follow Up Instructions:    I discussed the assessment and treatment plan with the patient. The patient was provided an opportunity to ask questions and all were answered. The patient agreed with the plan and demonstrated an understanding of the instructions.   The patient was advised to call back or seek an in-person evaluation if the symptoms worsen or if the condition fails to improve as anticipated.   Lemont Fillers, NP

## 2019-09-13 NOTE — Patient Instructions (Addendum)
Restart olmesartan-hct for blood pressure today. Check blood pressure once daily for the next few days then send me your readings via mychart. Call [facility] to schedule a covid-19 test today. Please quaratine at home pending results of covid-19.  1.  text "covid" to [phone]    or   2.  Schedule at [email or web address]   or    3.  Call [phone]   Psychiatric Services:  Dr. Ardeth Sportsman - [phone] Dr. Milagros Evener [facility]) - [phone] [facility]) - [phone] [facility]) [phone]  [facility]) [phone] [facility]) - [phone] [facility]) - [phone] [facility], [address] [place], [place] [phone]

## 2019-09-14 ENCOUNTER — Other Ambulatory Visit: Payer: No Typology Code available for payment source

## 2019-09-14 ENCOUNTER — Other Ambulatory Visit: Payer: Self-pay

## 2019-09-14 DIAGNOSIS — Z20822 Contact with and (suspected) exposure to covid-19: Secondary | ICD-10-CM

## 2019-09-15 ENCOUNTER — Encounter: Payer: Self-pay | Admitting: Family

## 2019-09-15 LAB — NOVEL CORONAVIRUS, NAA: SARS-CoV-2, NAA: NOT DETECTED

## 2019-09-15 LAB — SARS-COV-2, NAA 2 DAY TAT

## 2019-10-30 ENCOUNTER — Other Ambulatory Visit: Payer: No Typology Code available for payment source

## 2019-10-30 ENCOUNTER — Other Ambulatory Visit: Payer: Self-pay

## 2019-10-30 DIAGNOSIS — Z20822 Contact with and (suspected) exposure to covid-19: Secondary | ICD-10-CM

## 2019-10-31 LAB — NOVEL CORONAVIRUS, NAA: SARS-CoV-2, NAA: NOT DETECTED

## 2019-10-31 LAB — SARS-COV-2, NAA 2 DAY TAT

## 2019-11-02 ENCOUNTER — Other Ambulatory Visit: Payer: No Typology Code available for payment source

## 2019-11-08 ENCOUNTER — Other Ambulatory Visit: Payer: Self-pay | Admitting: Family

## 2019-11-22 ENCOUNTER — Other Ambulatory Visit: Payer: Self-pay

## 2019-11-22 ENCOUNTER — Telehealth: Payer: Self-pay | Admitting: Family

## 2019-11-22 ENCOUNTER — Ambulatory Visit (INDEPENDENT_AMBULATORY_CARE_PROVIDER_SITE_OTHER): Payer: No Typology Code available for payment source | Admitting: Family

## 2019-11-22 VITALS — BP 130/79 | HR 96 | Temp 98.4°F | Resp 16 | Ht 61.0 in | Wt 307.0 lb

## 2019-11-22 DIAGNOSIS — Z7185 Encounter for immunization safety counseling: Secondary | ICD-10-CM | POA: Diagnosis not present

## 2019-11-22 DIAGNOSIS — D509 Iron deficiency anemia, unspecified: Secondary | ICD-10-CM | POA: Diagnosis not present

## 2019-11-22 DIAGNOSIS — I10 Essential (primary) hypertension: Secondary | ICD-10-CM | POA: Diagnosis not present

## 2019-11-22 DIAGNOSIS — G4733 Obstructive sleep apnea (adult) (pediatric): Secondary | ICD-10-CM

## 2019-11-22 NOTE — Patient Instructions (Signed)
Please complete lab work prior to leaving.   

## 2019-11-22 NOTE — Progress Notes (Signed)
Subjective:    Patient ID: Cristina Adkins, female    DOB: November 14, 1980, 39 y.o.   MRN: [id number]  HPI  Patient is a 39 yr old female who presents today for follow up of her hypertension.  HTN- on  BP Readings from Last 3 Encounters:  11/22/19 130/79  06/09/19 (!) 145/106  05/10/19 (!) 160/111   Reports that she is more active. Doing 4000 steps a day.  Wt Readings from Last 3 Encounters:  11/22/19 (!) 307 lb (139.3 kg)  06/09/19 300 lb (136.1 kg)  05/10/19 (!) 303 lb (137.4 kg)   Lab Results  Component Value Date   TSH 2.29 04/05/2019   Iron deficiency anemia- She is taking iron once a week. Notes that she gets constipated with the iron pills.  Periods are less heavy.  Lab Results  Component Value Date   WBC 10.3 02/28/2019   HGB 12.8 02/28/2019   HCT 40.1 02/28/2019   MCV 79.4 02/28/2019   PLT 539.0 (H) 02/28/2019   OSA-  Reports regular use of cpap.    Review of Systems See HPI  Past Medical History:  Diagnosis Date   History of alcohol abuse    Hypertension    Hypokalemia    Iron deficiency      Social History   Socioeconomic History   Marital status: Single    Spouse name: Not on file   Number of children: 1   Years of education: Not on file   Highest education level: Not on file  Occupational History   Occupation: Advice worker  Tobacco Use   Smoking status: Current Every Day Smoker    Packs/day: 1.00    Years: 9.00    Pack years: 9.00    Types: Cigarettes   Smokeless tobacco: Never Used  Building services engineer Use: Never used  Substance and Sexual Activity   Alcohol use: No   Drug use: No   Sexual activity: Yes    Partners: Male    Birth control/protection: None  Other Topics Concern   Not on file  Social History Narrative   Grandmother is irish/indian   2 half-sisters   2 sisters (full),    1/2 brother   Older brother same mom- brother has CP   3 1/2 younger brothers after me   1 daughter    Boyfriend    Works as a Futures trader   Completed associate degree in Theatre manager   No pets   Enjoys Clinical cytogeneticist   Social Determinants of Corporate investment banker Strain:    Difficulty of Paying Living Expenses: Not on file  Food Insecurity:    Worried About Programme researcher, broadcasting/film/video in the Last Year: Not on file   [employer] of Food in the Last Year: Not on file  Transportation Needs:    Lack of Transportation (Medical): Not on file   Lack of Transportation (Non-Medical): Not on file  Physical Activity:    Days of Exercise per Week: Not on file   Minutes of Exercise per Session: Not on file  Stress:    Feeling of Stress : Not on file  Social Connections:    Frequency of Communication with Friends and Family: Not on file   Frequency of Social Gatherings with Friends and Family: Not on file   Attends Religious Services: Not on file   Active Member of Clubs or Organizations: Not on file   Attends Banker Meetings: Not on file  Marital Status: Not on file  Intimate Partner Violence:    Fear of Current or Ex-Partner: Not on file   Emotionally Abused: Not on file   Physically Abused: Not on file   Sexually Abused: Not on file    No past surgical history on file.  Family History  Problem Relation Age of Onset   Hypertension Mother    Hypertension Maternal Grandmother    Diabetes Daughter    Lung cancer Daughter    Heart disease Maternal Uncle    Diabetes Maternal Uncle    Colon cancer Maternal Uncle     Allergies  Allergen Reactions   Latex     Irritated and burning skin   Other     Allergic to all fruit and raw tomatoes reaction face swells    Current Outpatient Medications on File Prior to Visit  Medication Sig Dispense Refill   amLODipine (NORVASC) 10 MG tablet TAKE ONE TABLET BY MOUTH ONE TIME DAILY 30 tablet 0   Ferrous Sulfate (IRON) 325 (65 Fe) MG TABS Take 1 tablet (325 mg total) by mouth every other day. 30  tablet 0   meloxicam (MOBIC) 7.5 MG tablet Take 1 tablet (7.5 mg total) by mouth daily. 14 tablet 0   olmesartan-hydrochlorothiazide (BENICAR HCT) 40-25 MG tablet Take 1 tablet by mouth daily. 90 tablet 1   No current facility-administered medications on file prior to visit.    BP 130/79 (BP Location: Right Arm, Patient Position: Sitting, Cuff Size: Large)    Pulse 96    Temp 98.4 F (36.9 C) (Oral)    Resp 16    Ht 5\' 1"  (1.549 m)    Wt (!) 307 lb (139.3 kg)    SpO2 100%    BMI 58.01 kg/m       Objective:   Physical Exam Constitutional:      Appearance: She is well-developed.  Neck:     Thyroid: No thyromegaly.  Cardiovascular:     Rate and Rhythm: Normal rate and regular rhythm.     Heart sounds: Normal heart sounds. No murmur heard.   Pulmonary:     Effort: Pulmonary effort is normal. No respiratory distress.     Breath sounds: Normal breath sounds. No wheezing.  Musculoskeletal:     Cervical back: Neck supple.  Skin:    General: Skin is warm and dry.  Neurological:     Mental Status: She is alert and oriented to person, place, and time.  Psychiatric:        Behavior: Behavior normal.        Thought Content: Thought content normal.        Judgment: Judgment normal.           Assessment & Plan:  HTN- BP stable. Continue amlodipine 10mg  once daily and benicar HCT 40-25. Will obtain follow up bmet.   Iron deficiency anemia- obtain CBC, serum iron.  Continue iron as tolerated.  OSA- stable on cpap, continue same.    Immunization counseling- Discussed importance of vaccination against COVID-19.  Questions answered.    Morbid obesity- will refer to medical weight management  This visit occurred during the SARS-CoV-2 public health emergency.  Safety protocols were in place, including screening questions prior to the visit, additional usage of staff PPE, and extensive cleaning of exam room while observing appropriate contact time as indicated for disinfecting  solutions.

## 2019-11-22 NOTE — Telephone Encounter (Signed)
See mychart.  

## 2019-11-23 LAB — BASIC METABOLIC PANEL
BUN: 12 mg/dL (ref 7–25)
CO2: 28 mmol/L (ref 20–32)
Calcium: 9.3 mg/dL (ref 8.6–10.2)
Chloride: 103 mmol/L (ref 98–110)
Creat: 0.95 mg/dL (ref 0.50–1.10)
Glucose, Bld: 92 mg/dL (ref 65–99)
Potassium: 4.4 mmol/L (ref 3.5–5.3)
Sodium: 139 mmol/L (ref 135–146)

## 2019-11-23 LAB — IRON: Iron: 33 ug/dL — ABNORMAL LOW (ref 40–190)

## 2019-11-23 LAB — FERRITIN: Ferritin: 21 ng/mL (ref 16–154)

## 2019-11-24 ENCOUNTER — Telehealth: Payer: Self-pay | Admitting: Family

## 2019-11-24 NOTE — Telephone Encounter (Signed)
See mychart.  

## 2020-01-28 ENCOUNTER — Other Ambulatory Visit: Payer: Self-pay | Admitting: Family

## 2020-02-16 ENCOUNTER — Emergency Department (HOSPITAL_BASED_OUTPATIENT_CLINIC_OR_DEPARTMENT_OTHER)
Admission: EM | Admit: 2020-02-16 | Discharge: 2020-02-16 | Disposition: A | Discharge status: Home / Self Care | Payer: No Typology Code available for payment source | Attending: Emergency Medicine | Admitting: Emergency Medicine

## 2020-02-16 ENCOUNTER — Encounter (HOSPITAL_BASED_OUTPATIENT_CLINIC_OR_DEPARTMENT_OTHER): Payer: Self-pay | Admitting: Emergency Medicine

## 2020-02-16 ENCOUNTER — Emergency Department (HOSPITAL_BASED_OUTPATIENT_CLINIC_OR_DEPARTMENT_OTHER): Payer: No Typology Code available for payment source

## 2020-02-16 ENCOUNTER — Other Ambulatory Visit: Payer: Self-pay

## 2020-02-16 DIAGNOSIS — K219 Gastro-esophageal reflux disease without esophagitis: Secondary | ICD-10-CM | POA: Insufficient documentation

## 2020-02-16 DIAGNOSIS — Z79899 Other long term (current) drug therapy: Secondary | ICD-10-CM | POA: Insufficient documentation

## 2020-02-16 DIAGNOSIS — F17210 Nicotine dependence, cigarettes, uncomplicated: Secondary | ICD-10-CM | POA: Insufficient documentation

## 2020-02-16 DIAGNOSIS — R600 Localized edema: Secondary | ICD-10-CM | POA: Insufficient documentation

## 2020-02-16 DIAGNOSIS — I10 Essential (primary) hypertension: Secondary | ICD-10-CM | POA: Diagnosis not present

## 2020-02-16 DIAGNOSIS — R079 Chest pain, unspecified: Secondary | ICD-10-CM | POA: Insufficient documentation

## 2020-02-16 DIAGNOSIS — Z91040 Latex allergy status: Secondary | ICD-10-CM | POA: Diagnosis not present

## 2020-02-16 LAB — CBC
HCT: 37.4 % (ref 36.0–46.0)
Hemoglobin: 12 g/dL (ref 12.0–15.0)
MCH: 26 pg (ref 26.0–34.0)
MCHC: 32.1 g/dL (ref 30.0–36.0)
MCV: 81 fL (ref 80.0–100.0)
Platelets: 498 10*3/uL — ABNORMAL HIGH (ref 150–400)
RBC: 4.62 MIL/uL (ref 3.87–5.11)
RDW: 20 % — ABNORMAL HIGH (ref 11.5–15.5)
WBC: 11.9 10*3/uL — ABNORMAL HIGH (ref 4.0–10.5)
nRBC: 0 % (ref 0.0–0.2)

## 2020-02-16 LAB — BASIC METABOLIC PANEL
Anion gap: 10 (ref 5–15)
BUN: 21 mg/dL — ABNORMAL HIGH (ref 6–20)
CO2: 23 mmol/L (ref 22–32)
Calcium: 8.8 mg/dL — ABNORMAL LOW (ref 8.9–10.3)
Chloride: 100 mmol/L (ref 98–111)
Creatinine, Ser: 1.13 mg/dL — ABNORMAL HIGH (ref 0.44–1.00)
GFR, Estimated: >60 mL/min (ref >60)
Glucose, Bld: 117 mg/dL — ABNORMAL HIGH (ref 70–99)
Potassium: 3.5 mmol/L (ref 3.5–5.1)
Sodium: 133 mmol/L — ABNORMAL LOW (ref 135–145)

## 2020-02-16 LAB — TROPONIN I (HIGH SENSITIVITY)
Troponin I (High Sensitivity): 3 ng/L (ref <18)
Troponin I (High Sensitivity): 3 ng/L (ref <18)

## 2020-02-16 MED ORDER — FAMOTIDINE 20 MG PO TABS: 20.0000 mg | ORAL_TABLET | Freq: Every day | ORAL | 0 refills | Status: DC

## 2020-02-16 MED ORDER — ACETAMINOPHEN 500 MG PO TABS
1000.0000 mg | ORAL_TABLET | Freq: Once | ORAL | Status: AC
  Administered 2020-02-16: 1000 mg via ORAL
  Filled 2020-02-16: qty 2

## 2020-02-16 MED ORDER — FAMOTIDINE 20 MG PO TABS
20.0000 mg | ORAL_TABLET | Freq: Once | ORAL | Status: AC
  Administered 2020-02-16: 20 mg via ORAL
  Filled 2020-02-16: qty 1

## 2020-02-16 NOTE — ED Provider Notes (Signed)
[facility] EMERGENCY DEPARTMENT Provider Note   CSN: [id number] Arrival date & time: 02/16/20  4967     History Chief Complaint  Patient presents with  . Chest Pain    Cristina Adkins is a 40 y.o. female.  Patient reports that she began to experience central chest Additional less than 1:30 PM.  She reports that approximately midnight this morning she began to feel increasing intensity of chest and pelvis.  She reports this is episodic for less than a few seconds and occurring multiple times in an hour.  Patient reports that she was admitted to the hospital for chest pain and reports that this feels worse than when she had elevated troponin levels.  She denies any difficulty breathing.  Patient has history of gastric reflux for which she intermittently takes pantoprazole as needed.  She reports that she has not had this in a while.  Patient reports having dental appointment only and.  She denies any GI upset nausea or vomiting that is associated with the pain.  She states that she has no known sick contacts in the last week.  She started no new medications or vitamins or dietary changes recently.  Patient states that she was resting in bed on the pain started.  Is some numbness and tingling in her bilateral lateral fingers but otherwise denies any weakness in her upper extremities.  She reports no vision changes and no headache.  Patient does not report any thoracic or cervical back pain but did have some lower back pain that went away.  She states that chest pain is made worse by sitting upright or leaning forward.  She reports smoking 1 pack/cigarettes per day since the age 32. She denies any alcohol or recreational drug use.  Patient's mother is present any confusion, slurring of speech or facial asymmetry.        Past Medical History:  Diagnosis Date  . History of alcohol abuse   . Hypertension   . Hypokalemia   . Iron deficiency     Patient Active Problem List    Diagnosis Date Noted  . Iron deficiency   . Hypertensive urgency 03/29/2017  . Elevated troponin 03/29/2017  . Cardiomegaly 03/29/2017  . Tobacco abuse 03/29/2017  . Anemia 03/29/2017  . Heavy menstrual bleeding 03/29/2017    History reviewed. No pertinent surgical history.   OB History    Gravida  1   Para      Term      Preterm      AB      Living        SAB      IAB      Ectopic      Multiple      Live Births              Family History  Problem Relation Age of Onset  . Hypertension Mother   . Hypertension Maternal Grandmother   . Diabetes Daughter   . Lung cancer Daughter   . Heart disease Maternal Uncle   . Diabetes Maternal Uncle   . Colon cancer Maternal Uncle     Social History   Tobacco Use  . Smoking status: Current Every Day Smoker    Packs/day: 1.00    Years: 9.00    Pack years: 9.00    Types: Cigarettes  . Smokeless tobacco: Never Used  Vaping Use  . Vaping Use: Never used  Substance Use Topics  . Alcohol use: No  .  Drug use: No    Home Medications Prior to Admission medications   Medication Sig Start Date End Date Taking? Authorizing Provider  famotidine (PEPCID) 20 MG tablet Take 1 tablet (20 mg total) by mouth daily. 02/16/20 03/17/20 Yes Simmons-Robinson, Misaki Sozio, MD  amLODipine (NORVASC) 10 MG tablet TAKE ONE TABLET BY MOUTH ONE TIME DAILY 11/09/19   Sandford Craze'Sullivan, Melissa, NP  Ferrous Sulfate (IRON) 325 (65 Fe) MG TABS Take 1 tablet (325 mg total) by mouth every other day. 05/10/19   Sandford Craze'Sullivan, Melissa, NP  meloxicam (MOBIC) 7.5 MG tablet Take 1 tablet (7.5 mg total) by mouth daily. 09/13/19   Sandford Craze'Sullivan, Melissa, NP  olmesartan-hydrochlorothiazide (BENICAR HCT) 40-25 MG tablet TAKE ONE TABLET BY MOUTH ONE TIME DAILY 01/28/20   Sandford Craze'Sullivan, Melissa, NP    Allergies    Latex and Other  Review of Systems   Review of Systems  Constitutional: Negative for appetite change, chills and fever.  HENT: Negative for sore throat and  trouble swallowing.   Eyes: Negative for visual disturbance.  Respiratory: Negative for choking and shortness of breath.   Cardiovascular: Positive for chest pain.  Gastrointestinal: Negative for abdominal pain, nausea and vomiting.  Musculoskeletal: Positive for neck pain. Negative for back pain and neck stiffness.  Neurological: Negative for syncope, speech difficulty, weakness and headaches.    Physical Exam Updated Vital Signs BP 109/67   Pulse 70   Temp 97.8 F (36.6 C) (Oral)   Resp 20   Ht 5\' 1"  (1.549 m)   Wt (!) 140.4 kg   SpO2 95%   BMI 58.48 kg/m   Physical Exam Constitutional:      General: She is not in acute distress.    Appearance: She is obese. She is not diaphoretic.  Cardiovascular:     Rate and Rhythm: Normal rate and regular rhythm.     Pulses:          Radial pulses are 2+ on the right side and 2+ on the left side.     Heart sounds: Normal heart sounds. No murmur heard.   Pulmonary:     Effort: Pulmonary effort is normal. No accessory muscle usage or respiratory distress.     Breath sounds: Normal breath sounds. No wheezing, rhonchi or rales.  Chest:     Chest wall: No tenderness.  Abdominal:     General: Bowel sounds are normal.     Palpations: Abdomen is soft.     Tenderness: There is no abdominal tenderness.  Musculoskeletal:     Cervical back: Normal range of motion.     Comments: Trace LE edema    Lymphadenopathy:     Cervical: No cervical adenopathy.  Skin:    General: Skin is warm.     Capillary Refill: Capillary refill takes less than 2 seconds.     Findings: No erythema or rash.  Neurological:     Mental Status: She is alert and oriented to person, place, and time.     ED Results / Procedures / Treatments   Labs (all labs ordered are listed, but only abnormal results are displayed) Labs Reviewed  BASIC METABOLIC PANEL - Abnormal; Notable for the following components:      Result Value   Sodium 133 (*)    Glucose, Bld 117  (*)    BUN 21 (*)    Creatinine, Ser 1.13 (*)    Calcium 8.8 (*)    All other components within normal limits  CBC - Abnormal; Notable for the  following components:   WBC 11.9 (*)    RDW 20.0 (*)    Platelets 498 (*)    All other components within normal limits  TROPONIN I (HIGH SENSITIVITY)  TROPONIN I (HIGH SENSITIVITY)    EKG EKG Interpretation  Date/Time:  Friday February 16 2020 06:33:43 EST Ventricular Rate:  92 PR Interval:    QRS Duration: 93 QT Interval:  364 QTC Calculation: 451 R Axis:   10 Text Interpretation: Sinus rhythm Low voltage, precordial leads Borderline T abnormalities, lateral leads Baseline wander in lead(s) V5 V6 No STEMI Confirmed by Alona Bene [phone]) on 02/16/2020 6:44:07 AM   Radiology DG Chest Portable 1 View  Result Date: 02/16/2020 CLINICAL DATA:  Chest pain since last night EXAM: PORTABLE CHEST 1 VIEW COMPARISON:  01/02/2019 FINDINGS: Normal heart size and mediastinal contours. Low volume chest. No acute infiltrate or edema. No effusion or pneumothorax. No acute osseous findings. IMPRESSION: Negative low volume chest. Electronically Signed   By: Marnee Spring M.D.   On: 02/16/2020 07:07    Procedures Procedures (including critical care time)  Medications Ordered in ED Medications  acetaminophen (TYLENOL) tablet 1,000 mg (1,000 mg Oral Given 02/16/20 0834)  famotidine (PEPCID) tablet 20 mg (20 mg Oral Given 02/16/20 4166)    ED Course  I have reviewed the triage vital signs and the nursing notes.  Pertinent labs & imaging results that were available during my care of the patient were reviewed by me and considered in my medical decision making (see chart for details).  Clinical Course as of 02/16/20 1000  Fri Feb 16, 2020  0630 EKG without significant change from prior studies. CXR low volume without acute findings for cardiac or pulmonary fields. Initial trop of 3. BMP notable for mildly elevated creatinine, Glucose 117, normal CBC   [MS]    Clinical Course User Index [MS] Simmons-Robinson, Shriyans Kuenzi, MD   MDM Rules/Calculators/A&P                          Cristina Adkins is a 40 y.o. female presenting with neck and central chest pain since midnight this AM. Patient previously admitted in 2019 for elevated troponin levels with echocardiogram showing mildly dilated atria and normal EF and valves. Patient's EKG with some T wave flattening that appears to be consistent with prior EKGs, there are no signs of ischemia or ST segmental changes in comparison to prior EKGs. First troponin level low at 3, will await second troponin. CXR with no abnormal cardiac or pulmonary findings and chest discomfort does not appear to be pleuritic in nature. Considered aortic dissection but lower suspicion based on no historical report of back pain nor abdominal pain with BP within normal limits. Patient also reports adherence to antihypertensive regimen. Patient has hx of GERD but denies increased frequency of symptoms of late. Considered pericarditis but presentation inconsitent as pressure is worsened by leaning forward and would expect that positional change to improve symptoms, also patient is without friction rub on cardiac auscultation and there were no EKG changes to indicate this. Given multiple risk factors including obesity, HTN, hx of elevated troponin levels requiring admission and tobacco use, believe patient would benefit from outpatient follow up with cardiology for further evaluation.  Patient deemed appropriate for discharge home after subsequent normal troponin levels, 3>3. Repeat EKG with no interim changes, notable for HR 92, WTc 456, nonspecific Twave flattening similar to prior study. Patient reported improved pain level after pepcid, and  Tylenol. Discomfort may have been 2/2 to GERD. Patient recommended for outpatient follow up with primary care and cardiology. Discussed return precautions for worsening or persistent chest pain, SOB or  syncope. Both the patient and mother were in agreement with this plan.    Final Clinical Impression(s) / ED Diagnoses Final diagnoses:  Nonspecific chest pain  Gastroesophageal reflux disease, unspecified whether esophagitis present    Rx / DC Orders ED Discharge Orders         Ordered    famotidine (PEPCID) 20 MG tablet  Daily        02/16/20 0955           Ronnald Ramp, MD 02/16/20 1000    Blane Ohara, MD 02/16/20 [phone]

## 2020-02-16 NOTE — ED Triage Notes (Signed)
Pt c/o sharp chest pain radiating up to neck since 1800 last night.

## 2020-02-16 NOTE — Discharge Instructions (Addendum)
You were evaluated for neck and chest.  Your blood work did  not show any abnormalities that would be consistent with infection or depleted electrolytes.   Your troponin or heart enzyme levels were well within normal range at 3 for 2 measurements as well as your EKG did not show any significant changes from your prior studies.  Your symptoms responded well to Tylenol and Pepcid so we will send you a prescription for this.  We also recommend that you follow-up with your primary care provider as well as cardiology.

## 2020-02-16 NOTE — ED Notes (Signed)
Pt assisted to toilet 

## 2020-02-16 NOTE — ED Notes (Signed)
Pt c/o increased pressure in chest, with radiation to her ears. Pt was tearful, states pain 9/10. She indicated that it was 10/10 when she came in, but that it had "eased off" and now is higher again. Pt alert & oriented, nad noted.   EDP Zavitz at bedside.

## 2020-09-02 ENCOUNTER — Encounter: Payer: Self-pay | Admitting: Family

## 2020-09-02 ENCOUNTER — Other Ambulatory Visit: Payer: Self-pay | Admitting: Family

## 2020-09-02 MED ORDER — AMLODIPINE BESYLATE 10 MG PO TABS: 10.0000 mg | ORAL_TABLET | Freq: Every day | ORAL | 0 refills | Status: DC

## 2020-09-02 MED ORDER — OLMESARTAN MEDOXOMIL-HCTZ 40-25 MG PO TABS: 1.0000 | ORAL_TABLET | Freq: Every day | ORAL | 0 refills | Status: DC

## 2020-09-18 ENCOUNTER — Other Ambulatory Visit: Payer: Self-pay

## 2020-09-18 ENCOUNTER — Ambulatory Visit (INDEPENDENT_AMBULATORY_CARE_PROVIDER_SITE_OTHER): Payer: No Typology Code available for payment source | Admitting: Family

## 2020-09-18 VITALS — BP 119/82 | HR 90 | Temp 98.9°F | Resp 16 | Wt 293.0 lb

## 2020-09-18 DIAGNOSIS — D509 Iron deficiency anemia, unspecified: Secondary | ICD-10-CM

## 2020-09-18 DIAGNOSIS — G4700 Insomnia, unspecified: Secondary | ICD-10-CM | POA: Diagnosis not present

## 2020-09-18 DIAGNOSIS — F419 Anxiety disorder, unspecified: Secondary | ICD-10-CM

## 2020-09-18 DIAGNOSIS — F32A Depression, unspecified: Secondary | ICD-10-CM

## 2020-09-18 DIAGNOSIS — I10 Essential (primary) hypertension: Secondary | ICD-10-CM | POA: Diagnosis not present

## 2020-09-18 LAB — CBC WITH DIFFERENTIAL/PLATELET
Basophils Absolute: 0.1 10*3/uL (ref 0.0–0.1)
Basophils Relative: 1.2 % (ref 0.0–3.0)
Eosinophils Absolute: 0.2 10*3/uL (ref 0.0–0.7)
Eosinophils Relative: 2.1 % (ref 0.0–5.0)
HCT: 39.6 % (ref 36.0–46.0)
Hemoglobin: 12.3 g/dL (ref 12.0–15.0)
Lymphocytes Relative: 16.9 % (ref 12.0–46.0)
Lymphs Abs: 1.8 10*3/uL (ref 0.7–4.0)
MCHC: 31.2 g/dL (ref 30.0–36.0)
MCV: 78.5 fl (ref 78.0–100.0)
Monocytes Absolute: 0.7 10*3/uL (ref 0.1–1.0)
Monocytes Relative: 6.6 % (ref 3.0–12.0)
Neutro Abs: 7.8 10*3/uL — ABNORMAL HIGH (ref 1.4–7.7)
Neutrophils Relative %: 73.2 % (ref 43.0–77.0)
Platelets: 508 10*3/uL — ABNORMAL HIGH (ref 150.0–400.0)
RBC: 5.05 Mil/uL (ref 3.87–5.11)
RDW: 20.8 % — ABNORMAL HIGH (ref 11.5–15.5)
WBC: 10.7 10*3/uL — ABNORMAL HIGH (ref 4.0–10.5)

## 2020-09-18 LAB — BASIC METABOLIC PANEL
BUN: 16 mg/dL (ref 6–23)
CO2: 26 mEq/L (ref 19–32)
Calcium: 9.4 mg/dL (ref 8.4–10.5)
Chloride: 102 mEq/L (ref 96–112)
Creatinine, Ser: 0.94 mg/dL (ref 0.40–1.20)
GFR: 76.69 mL/min (ref >60.00)
Glucose, Bld: 87 mg/dL (ref 70–99)
Potassium: 4.2 mEq/L (ref 3.5–5.1)
Sodium: 136 mEq/L (ref 135–145)

## 2020-09-18 LAB — FERRITIN: Ferritin: 18.6 ng/mL (ref 10.0–291.0)

## 2020-09-18 LAB — IRON: Iron: 28 ug/dL — ABNORMAL LOW (ref 42–145)

## 2020-09-18 MED ORDER — AMLODIPINE BESYLATE 10 MG PO TABS: 10.0000 mg | ORAL_TABLET | Freq: Every day | ORAL | 1 refills | Status: DC

## 2020-09-18 MED ORDER — AMITRIPTYLINE HCL 25 MG PO TABS: 25.0000 mg | ORAL_TABLET | Freq: Every day | ORAL | 1 refills | Status: DC

## 2020-09-18 MED ORDER — OLMESARTAN MEDOXOMIL-HCTZ 40-25 MG PO TABS: 1.0000 | ORAL_TABLET | Freq: Every day | ORAL | 1 refills | Status: DC

## 2020-09-18 NOTE — Assessment & Plan Note (Signed)
Uncontrolled. Will give trial of elavil 25mg  PO HS. This may have some benefit on her mood as well.

## 2020-09-18 NOTE — Assessment & Plan Note (Signed)
BP stable on amlodipine 10mg  and benicar HCT.  Continue same. Check bmet.

## 2020-09-18 NOTE — Patient Instructions (Signed)
Please complete lab work prior to leaving. Please schedule a visit with a counselor- [phone]

## 2020-09-18 NOTE — Progress Notes (Signed)
Subjective:   By signing my name below, I, Cristina Adkins, attest that this documentation has been prepared under the direction and in the presence of Cristina Craze NP. 09/18/2020    Patient ID: Cristina Adkins, female    DOB: Oct 01, 1980, 40 y.o.   MRN: [id number]  Chief Complaint  Patient presents with   Hypertension    Here for follow up    Hypertension  Patient is in today for a office visit.  Blood pressure- Her blood pressure is doing weil during this visit. She continues taking 40-25 mg benicar HCT daily PO, 10 mg amlodipine daily PO and reports no new issues while taking them. She denies having any leg swelling at this time.   BP Readings from Last 3 Encounters:  09/18/20 119/82  02/16/20 108/87  11/22/19 130/79   Iron- She is not consistent in taking her 325 mg iron supplements.  Acid Reflux- She only takes 20 mg Pepcid PRN when her symptoms flare up.  Immunizations- She does not have the Covid-19 vaccines and is not willing to get it at this time.  Pap smear- She is due for a pap smear and is interested in getting a pap smear during her next physical exam.  Anxiety- She reports that since her uncle passed away in the beginning of the year she has insomnia and anxiety when she thinks about him. She has not tried counseling and is interested in seeing the counselor at her job or church. She is only sleeping 3-4 hrs a night despite going to bed at a normal hour. No improvement in sleep with use of melatonin or tylenol PM.  Health Maintenance Due  Topic Date Due   COVID-19 Vaccine (1) Never done   Pneumococcal Vaccine 9-51 Years old (1 - PCV) Never done   Hepatitis C Screening  Never done   TETANUS/TDAP  Never done   PAP SMEAR-Modifier  Never done   INFLUENZA VACCINE  08/26/2020    Past Medical History:  Diagnosis Date   History of alcohol abuse    Hypertension    Hypokalemia    Iron deficiency     No past surgical history on file.  Family History  Problem  Relation Age of Onset   Hypertension Mother    Hypertension Maternal Grandmother    Diabetes Daughter    Lung cancer Daughter    Heart disease Maternal Uncle    Diabetes Maternal Uncle    Colon cancer Maternal Uncle     Social History   Socioeconomic History   Marital status: Single    Spouse name: Not on file   Number of children: 1   Years of education: Not on file   Highest education level: Not on file  Occupational History   Occupation: Advice worker  Tobacco Use   Smoking status: Every Day    Packs/day: 1.00    Years: 9.00    Pack years: 9.00    Types: Cigarettes   Smokeless tobacco: Never  Vaping Use   Vaping Use: Never used  Substance and Sexual Activity   Alcohol use: No   Drug use: No   Sexual activity: Yes    Partners: Male    Birth control/protection: None  Other Topics Concern   Not on file  Social History Narrative   Grandmother is irish/indian   2 half-sisters   2 sisters (full),    1/2 brother   Older brother same mom- brother has CP   3 1/2 younger brothers after  me   1 daughter    Boyfriend   Works as a Futures trader   Completed associate degree in Theatre manager   No pets   Enjoys Clinical cytogeneticist   Social Determinants of Corporate investment banker Strain: Not on file  Food Insecurity: Not on file  Transportation Needs: Not on file  Physical Activity: Not on file  Stress: Not on file  Social Connections: Not on file  Intimate Partner Violence: Not on file    Outpatient Medications Prior to Visit  Medication Sig Dispense Refill   Ferrous Sulfate (IRON) 325 (65 Fe) MG TABS Take 1 tablet (325 mg total) by mouth every other day. 30 tablet 0   amLODipine (NORVASC) 10 MG tablet Take 1 tablet (10 mg total) by mouth daily. 30 tablet 0   olmesartan-hydrochlorothiazide (BENICAR HCT) 40-25 MG tablet Take 1 tablet by mouth daily. 30 tablet 0   famotidine (PEPCID) 20 MG tablet Take 1 tablet (20 mg total) by mouth daily.  30 tablet 0   meloxicam (MOBIC) 7.5 MG tablet Take 1 tablet (7.5 mg total) by mouth daily. 14 tablet 0   No facility-administered medications prior to visit.    Allergies  Allergen Reactions   Latex     Irritated and burning skin   Other     Allergic to all fruit and raw tomatoes reaction face swells    Review of Systems  Cardiovascular:  Negative for leg swelling.  Psychiatric/Behavioral:  The patient is nervous/anxious and has insomnia.       Objective:    Physical Exam Constitutional:      General: She is not in acute distress.    Appearance: Normal appearance. She is not ill-appearing.  HENT:     Head: Normocephalic and atraumatic.     Right Ear: External ear normal.     Left Ear: External ear normal.  Eyes:     Extraocular Movements: Extraocular movements intact.     Pupils: Pupils are equal, round, and reactive to light.  Cardiovascular:     Rate and Rhythm: Normal rate and regular rhythm.     Heart sounds: Normal heart sounds. No murmur heard.   No gallop.  Pulmonary:     Effort: Pulmonary effort is normal. No respiratory distress.     Breath sounds: Normal breath sounds. No wheezing or rales.  Musculoskeletal:     Right lower leg: No edema.     Left lower leg: No edema.  Skin:    General: Skin is warm and dry.  Neurological:     Mental Status: She is alert and oriented to person, place, and time.  Psychiatric:        Behavior: Behavior normal.    BP 119/82 (BP Location: Right Arm, Patient Position: Sitting, Cuff Size: Large)   Pulse 90   Temp 98.9 F (37.2 C) (Oral)   Resp 16   Wt 293 lb (132.9 kg)   SpO2 99%   BMI 55.36 kg/m  Wt Readings from Last 3 Encounters:  09/18/20 293 lb (132.9 kg)  02/16/20 (!) 309 lb 8.4 oz (140.4 kg)  11/22/19 (!) 307 lb (139.3 kg)       Assessment & Plan:   Problem List Items Addressed This Visit       Unprioritized   Anemia   Relevant Orders   CBC with Differential/Platelet   Iron   Ferritin   Other  Visit Diagnoses     Primary hypertension    -  Primary   Relevant Medications   amLODipine (NORVASC) 10 MG tablet   olmesartan-hydrochlorothiazide (BENICAR HCT) 40-25 MG tablet   Other Relevant Orders   Basic metabolic panel        Meds ordered this encounter  Medications   amLODipine (NORVASC) 10 MG tablet    Sig: Take 1 tablet (10 mg total) by mouth daily.    Dispense:  90 tablet    Refill:  1    Order Specific Question:   Supervising Provider    Answer:   Danise Edge A [[id number]]   olmesartan-hydrochlorothiazide (BENICAR HCT) 40-25 MG tablet    Sig: Take 1 tablet by mouth daily.    Dispense:  90 tablet    Refill:  1    Requested drug refills are authorized, however, the patient needs further evaluation and/or laboratory testing before further refills are given. Ask her to make an appointment for this.    Order Specific Question:   Supervising Provider    Answer:   Danise Edge A [[id number]]   amitriptyline (ELAVIL) 25 MG tablet    Sig: Take 1 tablet (25 mg total) by mouth at bedtime.    Dispense:  30 tablet    Refill:  1    Order Specific Question:   Supervising Provider    Answer:   Danise Edge A [[id number]]    I, Cristina Craze NP, personally preformed the services described in this documentation.  All medical record entries made by the scribe were at my direction and in my presence.  I have reviewed the chart and discharge instructions (if applicable) and agree that the record reflects my personal performance and is accurate and complete. 09/18/2020   I,Cristina Adkins,acting as a Neurosurgeon for Lemont Fillers, NP.,have documented all relevant documentation on the behalf of Lemont Fillers, NP,as directed by  Lemont Fillers, NP while in the presence of Lemont Fillers, NP.   Lemont Fillers, NP

## 2020-09-18 NOTE — Assessment & Plan Note (Signed)
Recommended that patient see counseling and let me know if her symptoms fail to improve.  Seemed to worsen with the passing of her uncle early this year.

## 2020-09-19 ENCOUNTER — Encounter: Payer: Self-pay | Admitting: Family

## 2020-09-20 ENCOUNTER — Other Ambulatory Visit: Payer: Self-pay

## 2020-09-20 MED ORDER — OLMESARTAN MEDOXOMIL-HCTZ 40-25 MG PO TABS: 1.0000 | ORAL_TABLET | Freq: Every day | ORAL | 1 refills | Status: DC

## 2020-09-20 MED ORDER — AMLODIPINE BESYLATE 10 MG PO TABS: 10.0000 mg | ORAL_TABLET | Freq: Every day | ORAL | 1 refills | Status: DC

## 2020-09-27 NOTE — Telephone Encounter (Signed)
Spoke with patient her issue has been resolved.  She does not want rxs sent to [facility].  [facility] removed from list.

## 2020-10-16 ENCOUNTER — Ambulatory Visit (INDEPENDENT_AMBULATORY_CARE_PROVIDER_SITE_OTHER): Payer: No Typology Code available for payment source | Admitting: Family

## 2020-10-16 ENCOUNTER — Other Ambulatory Visit: Payer: Self-pay

## 2020-10-16 VITALS — BP 114/69 | HR 88 | Temp 98.3°F | Resp 16 | Wt 298.0 lb

## 2020-10-16 DIAGNOSIS — G4700 Insomnia, unspecified: Secondary | ICD-10-CM

## 2020-10-16 DIAGNOSIS — F32A Depression, unspecified: Secondary | ICD-10-CM | POA: Diagnosis not present

## 2020-10-16 DIAGNOSIS — F419 Anxiety disorder, unspecified: Secondary | ICD-10-CM

## 2020-10-16 MED ORDER — SERTRALINE HCL 50 MG PO TABS: ORAL_TABLET | ORAL | 0 refills | Status: DC

## 2020-10-16 MED ORDER — TRAZODONE HCL 50 MG PO TABS: 25.0000 mg | ORAL_TABLET | Freq: Every evening | ORAL | 3 refills | Status: DC | PRN

## 2020-10-16 NOTE — Progress Notes (Signed)
Subjective:     Patient ID: Cristina Adkins, female    DOB: 05-07-1980, 40 y.o.   MRN: [id number]  Chief Complaint  Patient presents with   Insomnia    "No  improvement "    HPI Patient is in today for follow up. Last visit we added elavil 25mg  HS for sleep. She notes that insomnia continued to be an issue on this medication so she discontinued.   Reports that she is more irritable.  She states she kicked her dog which upsets her.  Did not sleep at all on Monday. Trying to do EAP through her job. Feels restless, can't sit still.  Paces the house a lot.   Health Maintenance Due  Topic Date Due   COVID-19 Vaccine (1) Never done   Hepatitis C Screening  Never done   TETANUS/TDAP  Never done   PAP SMEAR-Modifier  Never done   INFLUENZA VACCINE  Never done    Past Medical History:  Diagnosis Date   History of alcohol abuse    Hypertension    Hypokalemia    Iron deficiency     No past surgical history on file.  Family History  Problem Relation Age of Onset   Hypertension Mother    Hypertension Maternal Grandmother    Diabetes Daughter    Lung cancer Daughter    Heart disease Maternal Uncle    Diabetes Maternal Uncle    Colon cancer Maternal Uncle     Social History   Socioeconomic History   Marital status: Single    Spouse name: Not on file   Number of children: 1   Years of education: Not on file   Highest education level: Not on file  Occupational History   Occupation: [employer]  Tobacco Use   Smoking status: Every Day    Packs/day: 1.00    Years: 9.00    Pack years: 9.00    Types: Cigarettes   Smokeless tobacco: Never  Vaping Use   Vaping Use: Never used  Substance and Sexual Activity   Alcohol use: No   Drug use: No   Sexual activity: Yes    Partners: Male    Birth control/protection: None  Other Topics Concern   Not on file  Social History Narrative   Grandmother is irish/indian   2 half-sisters   2 sisters (full),    1/2 brother    Older brother same mom- brother has CP   3 1/2 younger brothers after me   1 daughter    Boyfriend   Works as a Advice worker   Completed associate degree in Futures trader   No pets   Enjoys Theatre manager   Social Determinants of Clinical cytogeneticist Strain: Not on file  Food Insecurity: Not on file  Transportation Needs: Not on file  Physical Activity: Not on file  Stress: Not on file  Social Connections: Not on file  Intimate Partner Violence: Not on file    Outpatient Medications Prior to Visit  Medication Sig Dispense Refill   amLODipine (NORVASC) 10 MG tablet Take 1 tablet (10 mg total) by mouth daily. 90 tablet 1   Ferrous Sulfate (IRON) 325 (65 Fe) MG TABS Take 1 tablet (325 mg total) by mouth every other day. 30 tablet 0   olmesartan-hydrochlorothiazide (BENICAR HCT) 40-25 MG tablet Take 1 tablet by mouth daily. 90 tablet 1   amitriptyline (ELAVIL) 25 MG tablet Take 1 tablet (25 mg total) by mouth at bedtime.  30 tablet 1   No facility-administered medications prior to visit.    Allergies  Allergen Reactions   Latex     Irritated and burning skin   Other     Allergic to all fruit and raw tomatoes reaction face swells    ROS See HPI    Objective:    Physical Exam Constitutional:      Appearance: She is well-developed.  Cardiovascular:     Rate and Rhythm: Normal rate and regular rhythm.     Heart sounds: Normal heart sounds. No murmur heard. Pulmonary:     Effort: Pulmonary effort is normal. No respiratory distress.     Breath sounds: Normal breath sounds. No wheezing.  Psychiatric:        Behavior: Behavior normal.        Thought Content: Thought content normal.        Judgment: Judgment normal.    BP 114/69 (BP Location: Right Arm, Patient Position: Sitting, Cuff Size: Large)   Pulse 88   Temp 98.3 F (36.8 C) (Oral)   Resp 16   Wt 298 lb (135.2 kg)   SpO2 99%   BMI 56.31 kg/m  Wt Readings from Last 3  Encounters:  10/16/20 298 lb (135.2 kg)  09/18/20 293 lb (132.9 kg)  02/16/20 (!) 309 lb 8.4 oz (140.4 kg)       Assessment & Plan:   Problem List Items Addressed This Visit       Unprioritized   Insomnia    Uncontrolled. D/c elavil. Start trazodone 25-50mg  once nightly prn.       Anxiety and depression - Primary    Uncontrolled. Will initiate Zoloft 50mg .  I instructed pt to start 1/2 tablet once daily for 1 week and then increase to a full tablet once daily on week two as tolerated.  We discussed common side effects such as nausea, drowsiness and weight gain.  Also discussed rare but serious side effect of suicide ideation.  She is instructed to discontinue medication go directly to ED if this occurs.  Pt verbalizes understanding.  Plan follow up in 1 month to evaluate progress.          Relevant Medications   traZODone (DESYREL) 50 MG tablet   sertraline (ZOLOFT) 50 MG tablet    I have discontinued Shamaya Gipson "Denise"'s amitriptyline. I am also having her start on traZODone and sertraline. Additionally, I am having her maintain her Iron, olmesartan-hydrochlorothiazide, and amLODipine.  Meds ordered this encounter  Medications   traZODone (DESYREL) 50 MG tablet    Sig: Take 0.5-1 tablets (25-50 mg total) by mouth at bedtime as needed for sleep.    Dispense:  30 tablet    Refill:  3    Order Specific Question:   Supervising Provider    Answer:   A [[id number]]   sertraline (ZOLOFT) 50 MG tablet    Sig: 1/2 tab once daily for 1 week, then increase to a full tab once daily on week two    Dispense:  30 tablet    Refill:  0    Order Specific Question:   Supervising Provider    Answer:   Danise Edge A [[id number]]

## 2020-10-16 NOTE — Patient Instructions (Signed)
Start zoloft 1/2 tab once daily for 1 week, then increase to a full tab on week two. Start Trazodone 1/2-1 tablet 1 hour before bedtime for sleep.

## 2020-10-16 NOTE — Assessment & Plan Note (Signed)
Uncontrolled. Will initiate Zoloft 50mg .  I instructed pt to start 1/2 tablet once daily for 1 week and then increase to a full tablet once daily on week two as tolerated.  We discussed common side effects such as nausea, drowsiness and weight gain.  Also discussed rare but serious side effect of suicide ideation.  She is instructed to discontinue medication go directly to ED if this occurs.  Pt verbalizes understanding.  Plan follow up in 1 month to evaluate progress.

## 2020-10-16 NOTE — Assessment & Plan Note (Signed)
Uncontrolled. D/c elavil. Start trazodone 25-50mg  once nightly prn.

## 2020-11-13 ENCOUNTER — Ambulatory Visit: Payer: No Typology Code available for payment source | Admitting: Family

## 2020-12-03 ENCOUNTER — Encounter: Payer: Self-pay | Admitting: Family

## 2020-12-03 ENCOUNTER — Other Ambulatory Visit: Payer: Self-pay

## 2020-12-03 ENCOUNTER — Ambulatory Visit: Payer: No Typology Code available for payment source | Admitting: Family

## 2020-12-03 VITALS — BP 139/84 | HR 77 | Temp 98.4°F | Resp 16

## 2020-12-03 DIAGNOSIS — Z87891 Personal history of nicotine dependence: Secondary | ICD-10-CM

## 2020-12-03 DIAGNOSIS — E611 Iron deficiency: Secondary | ICD-10-CM | POA: Diagnosis not present

## 2020-12-03 DIAGNOSIS — G4700 Insomnia, unspecified: Secondary | ICD-10-CM

## 2020-12-03 DIAGNOSIS — F32A Depression, unspecified: Secondary | ICD-10-CM

## 2020-12-03 DIAGNOSIS — I10 Essential (primary) hypertension: Secondary | ICD-10-CM

## 2020-12-03 DIAGNOSIS — F419 Anxiety disorder, unspecified: Secondary | ICD-10-CM

## 2020-12-03 LAB — CBC WITH DIFFERENTIAL/PLATELET
Basophils Absolute: 0 10*3/uL (ref 0.0–0.1)
Basophils Relative: 0.3 % (ref 0.0–3.0)
Eosinophils Absolute: 0.2 10*3/uL (ref 0.0–0.7)
Eosinophils Relative: 1.8 % (ref 0.0–5.0)
HCT: 37.3 % (ref 36.0–46.0)
Hemoglobin: 11.7 g/dL — ABNORMAL LOW (ref 12.0–15.0)
Lymphocytes Relative: 19.4 % (ref 12.0–46.0)
Lymphs Abs: 1.6 10*3/uL (ref 0.7–4.0)
MCHC: 31.3 g/dL (ref 30.0–36.0)
MCV: 79 fl (ref 78.0–100.0)
Monocytes Absolute: 0.5 10*3/uL (ref 0.1–1.0)
Monocytes Relative: 6.3 % (ref 3.0–12.0)
Neutro Abs: 5.9 10*3/uL (ref 1.4–7.7)
Neutrophils Relative %: 72.2 % (ref 43.0–77.0)
Platelets: 510 10*3/uL — ABNORMAL HIGH (ref 150.0–400.0)
RBC: 4.72 Mil/uL (ref 3.87–5.11)
RDW: 20.4 % — ABNORMAL HIGH (ref 11.5–15.5)
WBC: 8.2 10*3/uL (ref 4.0–10.5)

## 2020-12-03 LAB — IRON: Iron: 38 ug/dL — ABNORMAL LOW (ref 42–145)

## 2020-12-03 LAB — FERRITIN: Ferritin: 15.5 ng/mL (ref 10.0–291.0)

## 2020-12-03 NOTE — Assessment & Plan Note (Addendum)
BP Readings from Last 3 Encounters:  12/03/20 139/84  10/16/20 114/69  09/18/20 119/82   BP is stable. Continue amlodipine 10mg  once daily and Benicar-HCT 40-25mg .

## 2020-12-03 NOTE — Assessment & Plan Note (Signed)
Commended pt on quitting smoking

## 2020-12-03 NOTE — Assessment & Plan Note (Signed)
Stable, continue prn trazodone HS.

## 2020-12-03 NOTE — Assessment & Plan Note (Signed)
Tolerating QOD iron. Obtain follow up levels.

## 2020-12-03 NOTE — Progress Notes (Signed)
Subjective:   By signing my name below, I, Cristina Adkins, attest that this documentation has been prepared under the direction and in the presence of Sandford Craze, NP, 12/03/2020   Patient ID: Cristina Adkins, female    DOB: [birth year], 40 y.o.   MRN: [id number]  Chief Complaint  Patient presents with   Anxiety    Here for follow up, "doing well on medication"   Depression    Here for follow up    HPI Patient is in today for an office visit.  High blood pressure: She has a history of high blood pressure. She takes 10 mg amlodipine to help control her blood pressure. She notes that the medication has been doing well and denies any swelling in her feet. Her blood pressure in the office today is within good range. BP Readings from Last 3 Encounters:  12/03/20 139/84  10/16/20 114/69  09/18/20 119/82  Sleep: She mentions she has been doing well with her sleep. She notes she uses 50 mg trazodone as needed for sleep. Mood: During a previous appointment she had expressed concerns about depression and was prescribed 50 mg Zoloft. She notes she never ended up filling this prescription and starting it but does see an improvement in her mood. She has taken on a second job, so she is contributing her positive increase in mood to this new job.  Iron: She has been taking 325 mg iron supplements every other day. She notes no concerns with her period.  Smoking: She notes she quit smoking in September. She was motivated by the cost of cigarettes. She has seen an increase in her energy levels. She also notes that since quitting smoking she has been more active and motivated.  Immunizations: She is in need of a tetanus shot at this time. She is not receiving the flu shot or the Covid-19 vaccine at this time.   Health Maintenance Due  Topic Date Due   Hepatitis C Screening  Never done   TETANUS/TDAP  Never done   PAP SMEAR-Modifier  Never done    Past Medical History:  Diagnosis Date    History of alcohol abuse    Hypertension    Hypokalemia    Iron deficiency     No past surgical history on file.  Family History  Problem Relation Age of Onset   Hypertension Mother    Hypertension Maternal Grandmother    Diabetes Daughter    Lung cancer Daughter    Heart disease Maternal Uncle    Diabetes Maternal Uncle    Colon cancer Maternal Uncle     Social History   Socioeconomic History   Marital status: Single    Spouse name: Not on file   Number of children: 1   Years of education: Not on file   Highest education level: Not on file  Occupational History   Occupation: Advice worker  Tobacco Use   Smoking status: Former    Packs/day: 1.00    Years: 9.00    Pack years: 9.00    Types: Cigarettes   Smokeless tobacco: Never  Vaping Use   Vaping Use: Never used  Substance and Sexual Activity   Alcohol use: No   Drug use: No   Sexual activity: Yes    Partners: Male    Birth control/protection: None  Other Topics Concern   Not on file  Social History Narrative   Grandmother is irish/indian   2 half-sisters   2 sisters (full),  1/2 brother   Older brother same mom- brother has CP   3 1/2 younger brothers after me   1 daughter    Boyfriend   Works as a Futures trader   Completed associate degree in Theatre manager   No pets   Enjoys Clinical cytogeneticist   Social Determinants of Corporate investment banker Strain: Not on Ship broker Insecurity: Not on file  Transportation Needs: Not on file  Physical Activity: Not on file  Stress: Not on file  Social Connections: Not on file  Intimate Partner Violence: Not on file    Outpatient Medications Prior to Visit  Medication Sig Dispense Refill   amLODipine (NORVASC) 10 MG tablet Take 1 tablet (10 mg total) by mouth daily. 90 tablet 1   Ferrous Sulfate (IRON) 325 (65 Fe) MG TABS Take 1 tablet (325 mg total) by mouth every other day. 30 tablet 0   olmesartan-hydrochlorothiazide (BENICAR  HCT) 40-25 MG tablet Take 1 tablet by mouth daily. 90 tablet 1   traZODone (DESYREL) 50 MG tablet Take 0.5-1 tablets (25-50 mg total) by mouth at bedtime as needed for sleep. 30 tablet 3   sertraline (ZOLOFT) 50 MG tablet 1/2 tab once daily for 1 week, then increase to a full tab once daily on week two 30 tablet 0   No facility-administered medications prior to visit.    Allergies  Allergen Reactions   Latex     Irritated and burning skin   Other     Allergic to all fruit and raw tomatoes reaction face swells    Review of Systems  Psychiatric/Behavioral:  Positive for depression (improved but not resolved completely). Negative for substance abuse (Quit smoking in [place] 2022).       Objective:    Physical Exam Constitutional:      General: She is not in acute distress.    Appearance: Normal appearance. She is not ill-appearing.  HENT:     Head: Normocephalic and atraumatic.     Right Ear: External ear normal.     Left Ear: External ear normal.  Eyes:     Extraocular Movements: Extraocular movements intact.     Pupils: Pupils are equal, round, and reactive to light.  Cardiovascular:     Rate and Rhythm: Normal rate and regular rhythm.     Heart sounds: Normal heart sounds. No murmur heard.   No gallop.  Pulmonary:     Effort: Pulmonary effort is normal. No respiratory distress.     Breath sounds: Normal breath sounds. No wheezing or rales.  Skin:    General: Skin is warm and dry.  Neurological:     Mental Status: She is alert and oriented to person, place, and time.  Psychiatric:        Behavior: Behavior normal.        Judgment: Judgment normal.    BP 139/84 (BP Location: Right Arm, Patient Position: Sitting, Cuff Size: Large)   Pulse 77   Temp 98.4 F (36.9 C) (Oral)   Resp 16   SpO2 100%  Wt Readings from Last 3 Encounters:  10/16/20 298 lb (135.2 kg)  09/18/20 293 lb (132.9 kg)  02/16/20 (!) 309 lb 8.4 oz (140.4 kg)       Assessment & Plan:    Problem List Items Addressed This Visit       Unprioritized   Iron deficiency - Primary    Tolerating QOD iron. Obtain follow up levels.  Relevant Orders   Iron   Iron Binding Cap (TIBC)([facility])   Ferritin   CBC with Differential/Platelet   Insomnia    Stable, continue prn trazodone HS.        Hypertension    BP Readings from Last 3 Encounters:  12/03/20 139/84  10/16/20 114/69  09/18/20 119/82  BP is stable. Continue amlodipine 10mg  once daily and Benicar-HCT 40-25mg .       History of tobacco abuse    Commended pt on quitting smoking.       Anxiety and depression    Improved. She is enjoying getting out more and working her second job. Will monitor off of medication.       No orders of the defined types were placed in this encounter.   I, , NP, personally preformed the services described in this documentation.  All medical record entries made by the scribe were at my direction and in my presence.  I have reviewed the chart and discharge instructions (if applicable) and agree that the record reflects my personal performance and is accurate and complete. 12/03/2020  I,Cristina Adkins,acting as a 13/08/2020 for Neurosurgeon, NP.,have documented all relevant documentation on the behalf of Lemont Fillers, NP,as directed by  Lemont Fillers, NP while in the presence of Lemont Fillers, NP.  Lemont Fillers, NP

## 2020-12-03 NOTE — Assessment & Plan Note (Signed)
Improved. She is enjoying getting out more and working her second job. Will monitor off of medication.

## 2020-12-05 ENCOUNTER — Encounter: Payer: Self-pay | Admitting: Family

## 2020-12-05 LAB — IRON AND TIBC
Iron Saturation: 11 % — ABNORMAL LOW (ref 15–55)
Iron: 35 ug/dL (ref 27–159)
Total Iron Binding Capacity: 327 ug/dL (ref 250–450)
UIBC: 292 ug/dL (ref 131–425)

## 2020-12-10 ENCOUNTER — Emergency Department (HOSPITAL_BASED_OUTPATIENT_CLINIC_OR_DEPARTMENT_OTHER)
Admission: EM | Admit: 2020-12-10 | Discharge: 2020-12-11 | Disposition: A | Discharge status: Home / Self Care | Payer: No Typology Code available for payment source | Attending: Emergency Medicine | Admitting: Emergency Medicine

## 2020-12-10 ENCOUNTER — Encounter (HOSPITAL_BASED_OUTPATIENT_CLINIC_OR_DEPARTMENT_OTHER): Payer: Self-pay | Admitting: Emergency Medicine

## 2020-12-10 ENCOUNTER — Other Ambulatory Visit: Payer: Self-pay

## 2020-12-10 ENCOUNTER — Emergency Department (HOSPITAL_BASED_OUTPATIENT_CLINIC_OR_DEPARTMENT_OTHER): Payer: No Typology Code available for payment source

## 2020-12-10 DIAGNOSIS — Z79899 Other long term (current) drug therapy: Secondary | ICD-10-CM | POA: Insufficient documentation

## 2020-12-10 DIAGNOSIS — M542 Cervicalgia: Secondary | ICD-10-CM | POA: Insufficient documentation

## 2020-12-10 DIAGNOSIS — Z91040 Latex allergy status: Secondary | ICD-10-CM | POA: Insufficient documentation

## 2020-12-10 DIAGNOSIS — I10 Essential (primary) hypertension: Secondary | ICD-10-CM | POA: Diagnosis not present

## 2020-12-10 DIAGNOSIS — Z87891 Personal history of nicotine dependence: Secondary | ICD-10-CM | POA: Diagnosis not present

## 2020-12-10 DIAGNOSIS — R519 Headache, unspecified: Secondary | ICD-10-CM | POA: Diagnosis present

## 2020-12-10 DIAGNOSIS — N3000 Acute cystitis without hematuria: Secondary | ICD-10-CM

## 2020-12-10 LAB — BASIC METABOLIC PANEL
Anion gap: 11 (ref 5–15)
BUN: 19 mg/dL (ref 6–20)
CO2: 26 mmol/L (ref 22–32)
Calcium: 9.6 mg/dL (ref 8.9–10.3)
Chloride: 97 mmol/L — ABNORMAL LOW (ref 98–111)
Creatinine, Ser: 1.27 mg/dL — ABNORMAL HIGH (ref 0.44–1.00)
GFR, Estimated: 55 mL/min — ABNORMAL LOW (ref >60)
Glucose, Bld: 110 mg/dL — ABNORMAL HIGH (ref 70–99)
Potassium: 3.8 mmol/L (ref 3.5–5.1)
Sodium: 134 mmol/L — ABNORMAL LOW (ref 135–145)

## 2020-12-10 LAB — URINALYSIS, MICROSCOPIC (REFLEX)

## 2020-12-10 LAB — URINALYSIS, ROUTINE W REFLEX MICROSCOPIC
Bilirubin Urine: NEGATIVE
Glucose, UA: 100 mg/dL — AB
Hgb urine dipstick: NEGATIVE
Ketones, ur: NEGATIVE mg/dL
Leukocytes,Ua: NEGATIVE
Nitrite: NEGATIVE
Protein, ur: >300 mg/dL — AB
Specific Gravity, Urine: 1.025 (ref 1.005–1.030)
pH: 5.5 (ref 5.0–8.0)

## 2020-12-10 LAB — PREGNANCY, URINE: Preg Test, Ur: NEGATIVE

## 2020-12-10 MED ORDER — IOHEXOL 350 MG/ML SOLN
75.0000 mL | Freq: Once | INTRAVENOUS | Status: AC | PRN
  Administered 2020-12-10: 75 mL via INTRAVENOUS

## 2020-12-10 NOTE — ED Notes (Signed)
Pt. Reports she started with a headache today that caused her to hit the floor for approx. 40 mins.  She then reports she felt the need to be seen in the ED.  After being in the ED she began to feel L sided chest pain and L arm pain

## 2020-12-10 NOTE — ED Triage Notes (Signed)
Pt reports sudden onset of pain in head and collapsed to floor. Pt did not lose consciousness. Pt also c/o left hand numbness.

## 2020-12-11 ENCOUNTER — Encounter (HOSPITAL_BASED_OUTPATIENT_CLINIC_OR_DEPARTMENT_OTHER): Payer: Self-pay | Admitting: Emergency Medicine

## 2020-12-11 LAB — CBC WITH DIFFERENTIAL/PLATELET
Abs Immature Granulocytes: 0.11 10*3/uL — ABNORMAL HIGH (ref 0.00–0.07)
Basophils Absolute: 0.1 10*3/uL (ref 0.0–0.1)
Basophils Relative: 1 %
Eosinophils Absolute: 0.1 10*3/uL (ref 0.0–0.5)
Eosinophils Relative: 1 %
HCT: 42.7 % (ref 36.0–46.0)
Hemoglobin: 13.5 g/dL (ref 12.0–15.0)
Immature Granulocytes: 1 %
Lymphocytes Relative: 11 %
Lymphs Abs: 1.7 10*3/uL (ref 0.7–4.0)
MCH: 24.6 pg — ABNORMAL LOW (ref 26.0–34.0)
MCHC: 31.6 g/dL (ref 30.0–36.0)
MCV: 77.9 fL — ABNORMAL LOW (ref 80.0–100.0)
Monocytes Absolute: 0.8 10*3/uL (ref 0.1–1.0)
Monocytes Relative: 5 %
Neutro Abs: 12.3 10*3/uL — ABNORMAL HIGH (ref 1.7–7.7)
Neutrophils Relative %: 81 %
Platelets: 587 10*3/uL — ABNORMAL HIGH (ref 150–400)
RBC: 5.48 MIL/uL — ABNORMAL HIGH (ref 3.87–5.11)
RDW: 20.6 % — ABNORMAL HIGH (ref 11.5–15.5)
WBC: 15.1 10*3/uL — ABNORMAL HIGH (ref 4.0–10.5)
nRBC: 0 % (ref 0.0–0.2)

## 2020-12-11 MED ORDER — KETOROLAC TROMETHAMINE 30 MG/ML IJ SOLN
30.0000 mg | Freq: Once | INTRAMUSCULAR | Status: AC
  Administered 2020-12-11: 30 mg via INTRAVENOUS
  Filled 2020-12-11: qty 1

## 2020-12-11 MED ORDER — SODIUM CHLORIDE 0.9 % IV BOLUS
500.0000 mL | Freq: Once | INTRAVENOUS | Status: AC
  Administered 2020-12-11: 500 mL via INTRAVENOUS

## 2020-12-11 MED ORDER — CEPHALEXIN 250 MG PO CAPS
500.0000 mg | ORAL_CAPSULE | Freq: Once | ORAL | Status: AC
  Administered 2020-12-11: 500 mg via ORAL
  Filled 2020-12-11: qty 2

## 2020-12-11 MED ORDER — MAGNESIUM SULFATE 2 GM/50ML IV SOLN
2.0000 g | Freq: Once | INTRAVENOUS | Status: AC
  Administered 2020-12-11: 2 g via INTRAVENOUS
  Filled 2020-12-11: qty 50

## 2020-12-11 MED ORDER — CEPHALEXIN 500 MG PO CAPS: 500.0000 mg | ORAL_CAPSULE | Freq: Four times a day (QID) | ORAL | 0 refills | Status: DC

## 2020-12-11 NOTE — ED Provider Notes (Signed)
[facility] EMERGENCY DEPARTMENT Provider Note   CSN: [id number] Arrival date & time: 12/10/20  2153     History Chief Complaint  Patient presents with   Headache    Cristina Adkins is a 40 y.o. female.  The history is provided by the patient.  Illness Location:  At home Quality:  Neck pain that radiates into the head Severity:  Severe Onset quality:  Sudden Duration:  9 hours Timing:  Constant Progression:  Improving Chronicity:  New Context:  Was sitting all day typing and hand pins and needles in the left hand and then stood up to do dishes and had posterior right neck pain that radiated into the base of the head and she went down.  Did not strike head, no LOC.  No changes in vision or speech or weakness.  No chest pain. Relieved by:  Nothing Worsened by:  Nothing Ineffective treatments:  None tried Associated symptoms: headaches   Associated symptoms: no abdominal pain, no chest pain, no congestion, no cough, no diarrhea, no ear pain, no fever, no loss of consciousness, no myalgias, no rash, no rhinorrhea, no shortness of breath, no sore throat, no vomiting and no wheezing   Risk factors:  HTN Patient states she was typing all day and left hand felt tingley like pins and needles then stood up to do the dishes and had neck pain that radiated into her posterior base of the head and this pain made her go to her knees.  No weakness, no changes in vision or speech or cognition.  No chest pain.  No shortness of breath.  No exertional symptoms.  No nausea, no vomiting, no diaphoresis.      Past Medical History:  Diagnosis Date   History of alcohol abuse    Hypertension    Hypokalemia    Iron deficiency     Patient Active Problem List   Diagnosis Date Noted   Anxiety and depression 09/18/2020   Hypertension 09/18/2020   Insomnia 09/18/2020   Iron deficiency    Cardiomegaly 03/29/2017   History of tobacco abuse 03/29/2017   Anemia 03/29/2017   Heavy  menstrual bleeding 03/29/2017    History reviewed. No pertinent surgical history.   OB History     Gravida  1   Para      Term      Preterm      AB      Living         SAB      IAB      Ectopic      Multiple      Live Births              Family History  Problem Relation Age of Onset   Hypertension Mother    Hypertension Maternal Grandmother    Diabetes Daughter    Lung cancer Daughter    Heart disease Maternal Uncle    Diabetes Maternal Uncle    Colon cancer Maternal Uncle     Social History   Tobacco Use   Smoking status: Former    Packs/day: 1.00    Years: 9.00    Pack years: 9.00    Types: Cigarettes   Smokeless tobacco: Never  Vaping Use   Vaping Use: Never used  Substance Use Topics   Alcohol use: No   Drug use: No    Home Medications Prior to Admission medications   Medication Sig Start Date End Date Taking? Authorizing Provider  amLODipine (  NORVASC) 10 MG tablet Take 1 tablet (10 mg total) by mouth daily. 09/20/20   Sandford Craze, NP  Ferrous Sulfate (IRON) 325 (65 Fe) MG TABS Take 1 tablet (325 mg total) by mouth every other day. 05/10/19   Sandford Craze, NP  olmesartan-hydrochlorothiazide (BENICAR HCT) 40-25 MG tablet Take 1 tablet by mouth daily. 09/20/20   Sandford Craze, NP  traZODone (DESYREL) 50 MG tablet Take 0.5-1 tablets (25-50 mg total) by mouth at bedtime as needed for sleep. 10/16/20   Sandford Craze, NP    Allergies    Latex and Other  Review of Systems   Review of Systems  Constitutional:  Negative for fever.  HENT:  Negative for congestion, ear pain, rhinorrhea and sore throat.   Eyes:  Negative for redness and visual disturbance.  Respiratory:  Negative for cough, shortness of breath, wheezing and stridor.   Cardiovascular:  Negative for chest pain and leg swelling.  Gastrointestinal:  Negative for abdominal pain, diarrhea and vomiting.  Genitourinary:  Negative for dysuria and flank pain.   Musculoskeletal:  Negative for myalgias and neck stiffness.  Skin:  Negative for rash.  Neurological:  Positive for headaches. Negative for dizziness, loss of consciousness, syncope, facial asymmetry, speech difficulty, weakness and numbness.  Psychiatric/Behavioral:  Negative for agitation.   All other systems reviewed and are negative.  Physical Exam Updated Vital Signs BP 136/89   Pulse 96   Temp 97.8 F (36.6 C) (Oral)   Resp 20   Ht 5\' 1"  (1.549 m)   Wt 131.1 kg   LMP 11/20/2020 (Approximate)   SpO2 96%   BMI 54.61 kg/m   Physical Exam Vitals and nursing note reviewed.  Constitutional:      General: She is not in acute distress.    Appearance: Normal appearance.  HENT:     Head: Normocephalic and atraumatic.     Nose: Nose normal.     Mouth/Throat:     Mouth: Mucous membranes are moist.     Pharynx: Oropharynx is clear.  Eyes:     Extraocular Movements: Extraocular movements intact.     Conjunctiva/sclera: Conjunctivae normal.     Pupils: Pupils are equal, round, and reactive to light.  Neck:     Vascular: No carotid bruit.  Cardiovascular:     Rate and Rhythm: Normal rate and regular rhythm.     Pulses: Normal pulses.     Heart sounds: Normal heart sounds.  Pulmonary:     Effort: Pulmonary effort is normal.     Breath sounds: Normal breath sounds.  Abdominal:     General: Abdomen is flat. Bowel sounds are normal.     Palpations: Abdomen is soft.     Tenderness: There is no abdominal tenderness. There is no guarding.  Musculoskeletal:        General: No tenderness. Normal range of motion.     Cervical back: Normal range of motion and neck supple. No rigidity or tenderness.     Right lower leg: No edema.     Left lower leg: No edema.  Lymphadenopathy:     Cervical: No cervical adenopathy.  Skin:    General: Skin is warm and dry.     Capillary Refill: Capillary refill takes less than 2 seconds.  Neurological:     General: No focal deficit present.      Mental Status: She is alert and oriented to person, place, and time.     Cranial Nerves: No cranial nerve deficit.  Sensory: No sensory deficit.     Motor: No weakness.     Deep Tendon Reflexes: Reflexes normal.     Comments: 5/5 strength throughout to confrontation and intact sensation   Psychiatric:        Mood and Affect: Mood normal.        Behavior: Behavior normal.    ED Results / Procedures / Treatments   Labs (all labs ordered are listed, but only abnormal results are displayed) Results for orders placed or performed during the hospital encounter of 12/10/20  Pregnancy, urine  Result Value Ref Range   Preg Test, Ur NEGATIVE NEGATIVE  Urinalysis, Routine w reflex microscopic Urine, Clean Catch  Result Value Ref Range   Color, Urine YELLOW YELLOW   APPearance HAZY (A) CLEAR   Specific Gravity, Urine 1.025 1.005 - 1.030   pH 5.5 5.0 - 8.0   Glucose, UA 100 (A) NEGATIVE mg/dL   Hgb urine dipstick NEGATIVE NEGATIVE   Bilirubin Urine NEGATIVE NEGATIVE   Ketones, ur NEGATIVE NEGATIVE mg/dL   Protein, ur >300 (A) NEGATIVE mg/dL   Nitrite NEGATIVE NEGATIVE   Leukocytes,Ua NEGATIVE NEGATIVE  Urinalysis, Microscopic (reflex)  Result Value Ref Range   RBC / HPF 6-10 0 - 5 RBC/hpf   WBC, UA 6-10 0 - 5 WBC/hpf   Bacteria, UA MANY (A) NONE SEEN   Squamous Epithelial / LPF 0-5 0 - 5   Mucus PRESENT    Hyaline Casts, UA PRESENT    Granular Casts, UA PRESENT   CBC with Differential/Platelet  Result Value Ref Range   WBC 15.1 (H) 4.0 - 10.5 K/uL   RBC 5.48 (H) 3.87 - 5.11 MIL/uL   Hemoglobin 13.5 12.0 - 15.0 g/dL   HCT 42.7 36.0 - 46.0 %   MCV 77.9 (L) 80.0 - 100.0 fL   MCH 24.6 (L) 26.0 - 34.0 pg   MCHC 31.6 30.0 - 36.0 g/dL   RDW 20.6 (H) 11.5 - 15.5 %   Platelets 587 (H) 150 - 400 K/uL   nRBC 0.0 0.0 - 0.2 %   Neutrophils Relative % 81 %   Neutro Abs 12.3 (H) 1.7 - 7.7 K/uL   Lymphocytes Relative 11 %   Lymphs Abs 1.7 0.7 - 4.0 K/uL   Monocytes Relative 5 %    Monocytes Absolute 0.8 0.1 - 1.0 K/uL   Eosinophils Relative 1 %   Eosinophils Absolute 0.1 0.0 - 0.5 K/uL   Basophils Relative 1 %   Basophils Absolute 0.1 0.0 - 0.1 K/uL   WBC Morphology MORPHOLOGY UNREMARKABLE    RBC Morphology See Note    Smear Review PLATELET COUNT CONFIRMED BY SMEAR    Immature Granulocytes 1 %   Abs Immature Granulocytes 0.11 (H) 0.00 - 0.07 K/uL   Polychromasia PRESENT    Spherocytes PRESENT    Stomatocytes PRESENT   Basic metabolic panel  Result Value Ref Range   Sodium 134 (L) 135 - 145 mmol/L   Potassium 3.8 3.5 - 5.1 mmol/L   Chloride 97 (L) 98 - 111 mmol/L   CO2 26 22 - 32 mmol/L   Glucose, Bld 110 (H) 70 - 99 mg/dL   BUN 19 6 - 20 mg/dL   Creatinine, Ser 1.27 (H) 0.44 - 1.00 mg/dL   Calcium 9.6 8.9 - 10.3 mg/dL   GFR, Estimated 55 (L) >60 mL/min   Anion gap 11 5 - 15   CT ANGIO HEAD NECK W WO CM  Result Date: 12/11/2020 CLINICAL DATA:  Syncope and collapse.  Facial trauma. EXAM: CT ANGIOGRAPHY HEAD AND NECK TECHNIQUE: Multidetector CT imaging of the head and neck was performed using the standard protocol during bolus administration of intravenous contrast. Multiplanar CT image reconstructions and MIPs were obtained to evaluate the vascular anatomy. Carotid stenosis measurements (when applicable) are obtained utilizing NASCET criteria, using the distal internal carotid diameter as the denominator. CONTRAST:  41mL OMNIPAQUE IOHEXOL 350 MG/ML SOLN COMPARISON:  None. FINDINGS: CT HEAD FINDINGS Brain: There is no mass, hemorrhage or extra-axial collection. The size and configuration of the ventricles and extra-axial CSF spaces are normal. There is no acute or chronic infarction. The brain parenchyma is normal. Skull: The visualized skull base, calvarium and extracranial soft tissues are normal. Sinuses/Orbits: No fluid levels or advanced mucosal thickening of the visualized paranasal sinuses. No mastoid or middle ear effusion. The orbits are normal. CTA NECK  FINDINGS SKELETON: There is no bony spinal canal stenosis. No lytic or blastic lesion. OTHER NECK: Normal pharynx, larynx and major salivary glands. No cervical lymphadenopathy. Unremarkable thyroid gland. UPPER CHEST: No pneumothorax or pleural effusion. No nodules or masses. AORTIC ARCH: There is no calcific atherosclerosis of the aortic arch. There is no aneurysm, dissection or hemodynamically significant stenosis of the visualized portion of the aorta. Conventional 3 vessel aortic branching pattern. The visualized proximal subclavian arteries are widely patent. RIGHT CAROTID SYSTEM: Normal without aneurysm, dissection or stenosis. LEFT CAROTID SYSTEM: Normal without aneurysm, dissection or stenosis. VERTEBRAL ARTERIES: Left dominant configuration. Both origins are clearly patent. There is no dissection, occlusion or flow-limiting stenosis to the skull base (V1-V3 segments). CTA HEAD FINDINGS POSTERIOR CIRCULATION: --Vertebral arteries: Normal V4 segments. --Inferior cerebellar arteries: Normal. --Basilar artery: Normal. --Superior cerebellar arteries: Normal. --Posterior cerebral arteries (PCA): Normal. ANTERIOR CIRCULATION: --Intracranial internal carotid arteries: Normal. --Anterior cerebral arteries (ACA): Normal. Both A1 segments are present. Patent anterior communicating artery (a-comm). --Middle cerebral arteries (MCA): Normal. VENOUS SINUSES: As permitted by contrast timing, patent. ANATOMIC VARIANTS: None Review of the MIP images confirms the above findings. IMPRESSION: Normal CTA of the head and neck. Electronically Signed   By: Ulyses Jarred M.D.   On: 12/11/2020 00:18    EKG  EKG Interpretation  Date/Time:  Tuesday December 10 2020 23:18:46 EST Ventricular Rate:  100 PR Interval:  152 QRS Duration: 87 QT Interval:  347 QTC Calculation: 448 R Axis:   22 Text Interpretation: Sinus tachycardia Confirmed by Dory Horn) on 12/11/2020 3:41:26 AM         Radiology CT ANGIO HEAD  NECK W WO CM  Result Date: 12/11/2020 CLINICAL DATA:  Syncope and collapse.  Facial trauma. EXAM: CT ANGIOGRAPHY HEAD AND NECK TECHNIQUE: Multidetector CT imaging of the head and neck was performed using the standard protocol during bolus administration of intravenous contrast. Multiplanar CT image reconstructions and MIPs were obtained to evaluate the vascular anatomy. Carotid stenosis measurements (when applicable) are obtained utilizing NASCET criteria, using the distal internal carotid diameter as the denominator. CONTRAST:  33mL OMNIPAQUE IOHEXOL 350 MG/ML SOLN COMPARISON:  None. FINDINGS: CT HEAD FINDINGS Brain: There is no mass, hemorrhage or extra-axial collection. The size and configuration of the ventricles and extra-axial CSF spaces are normal. There is no acute or chronic infarction. The brain parenchyma is normal. Skull: The visualized skull base, calvarium and extracranial soft tissues are normal. Sinuses/Orbits: No fluid levels or advanced mucosal thickening of the visualized paranasal sinuses. No mastoid or middle ear effusion. The orbits are normal. CTA NECK FINDINGS SKELETON: There  is no bony spinal canal stenosis. No lytic or blastic lesion. OTHER NECK: Normal pharynx, larynx and major salivary glands. No cervical lymphadenopathy. Unremarkable thyroid gland. UPPER CHEST: No pneumothorax or pleural effusion. No nodules or masses. AORTIC ARCH: There is no calcific atherosclerosis of the aortic arch. There is no aneurysm, dissection or hemodynamically significant stenosis of the visualized portion of the aorta. Conventional 3 vessel aortic branching pattern. The visualized proximal subclavian arteries are widely patent. RIGHT CAROTID SYSTEM: Normal without aneurysm, dissection or stenosis. LEFT CAROTID SYSTEM: Normal without aneurysm, dissection or stenosis. VERTEBRAL ARTERIES: Left dominant configuration. Both origins are clearly patent. There is no dissection, occlusion or flow-limiting stenosis  to the skull base (V1-V3 segments). CTA HEAD FINDINGS POSTERIOR CIRCULATION: --Vertebral arteries: Normal V4 segments. --Inferior cerebellar arteries: Normal. --Basilar artery: Normal. --Superior cerebellar arteries: Normal. --Posterior cerebral arteries (PCA): Normal. ANTERIOR CIRCULATION: --Intracranial internal carotid arteries: Normal. --Anterior cerebral arteries (ACA): Normal. Both A1 segments are present. Patent anterior communicating artery (a-comm). --Middle cerebral arteries (MCA): Normal. VENOUS SINUSES: As permitted by contrast timing, patent. ANATOMIC VARIANTS: None Review of the MIP images confirms the above findings. IMPRESSION: Normal CTA of the head and neck. Electronically Signed   By: Ulyses Jarred M.D.   On: 12/11/2020 00:18    Procedures Procedures   Medications Ordered in ED Medications  iohexol (OMNIPAQUE) 350 MG/ML injection 75 mL (75 mLs Intravenous Contrast Given 12/10/20 2355)  sodium chloride 0.9 % bolus 500 mL ( Intravenous Stopped 12/11/20 0150)  magnesium sulfate IVPB 2 g 50 mL (0 g Intravenous Stopped 12/11/20 0119)  cephALEXin (KEFLEX) capsule 500 mg (500 mg Oral Given 12/11/20 0108)  ketorolac (TORADOL) 30 MG/ML injection 30 mg (30 mg Intravenous Given 12/11/20 0056)    ED Course  I have reviewed the triage vital signs and the nursing notes.  Pertinent labs & imaging results that were available during my care of the patient were reviewed by me and considered in my medical decision making (see chart for details).   Appreciate triage note but there was not chest pain, no shortness of breath, no nausea or vomiting or diaphoresis.  No leg pain, no OCP, no SOB, no travel.  PERC negative wells 0 highly doubt PE in this low risk patient.  No DOE or exertional symptoms.  Patient is here for headache and neck pain.  No weakness no numbness.  No symptoms. Patient was orthostatic and given IVF and a migraine cocktail and symptoms resolved.  I have treated for UTI in the ED  and sent RX to patient's pharmacy of record.  I suspect there was a vagal component, but patient was also orthostatic and was treated for this.  Hand is likely related to repetitive use.  There was no carotid dissection and no signs of a bleed or aneurysm in the 6-12 hour window.  Patient is stable for discharge with close follow up.   Final Clinical Impression(s) / ED Diagnoses Final diagnoses:  None   Return for intractable cough, coughing up blood, fevers > 100.4 unrelieved by medication, shortness of breath, intractable vomiting, chest pain, shortness of breath, weakness, numbness, changes in speech, facial asymmetry, abdominal pain, passing out, Inability to tolerate liquids or food, cough, altered mental status or any concerns. No signs of systemic illness or infection. The patient is nontoxic-appearing on exam and vital signs are within normal limits.  I have reviewed the triage vital signs and the nursing notes. Pertinent labs & imaging results that were available during my care  of the patient were reviewed by me and considered in my medical decision making (see chart for details). After history, exam, and medical workup I feel the patient has been appropriately medically screened and is safe for discharge home. Pertinent diagnoses were discussed with the patient. Patient was given return precautions. Rx / DC Orders ED Discharge Orders     None        Selvin Yun, MD 12/11/20 [phone]

## 2020-12-12 ENCOUNTER — Ambulatory Visit (INDEPENDENT_AMBULATORY_CARE_PROVIDER_SITE_OTHER): Payer: No Typology Code available for payment source | Admitting: Medical

## 2020-12-12 ENCOUNTER — Other Ambulatory Visit: Payer: Self-pay

## 2020-12-12 ENCOUNTER — Encounter: Payer: Self-pay | Admitting: Medical

## 2020-12-12 VITALS — BP 139/89 | HR 99 | Temp 98.2°F | Resp 18 | Ht 61.0 in | Wt 302.8 lb

## 2020-12-12 DIAGNOSIS — I10 Essential (primary) hypertension: Secondary | ICD-10-CM | POA: Diagnosis not present

## 2020-12-12 DIAGNOSIS — R8271 Bacteriuria: Secondary | ICD-10-CM | POA: Diagnosis not present

## 2020-12-12 DIAGNOSIS — R809 Proteinuria, unspecified: Secondary | ICD-10-CM

## 2020-12-12 DIAGNOSIS — R739 Hyperglycemia, unspecified: Secondary | ICD-10-CM

## 2020-12-12 DIAGNOSIS — D72829 Elevated white blood cell count, unspecified: Secondary | ICD-10-CM

## 2020-12-12 DIAGNOSIS — N390 Urinary tract infection, site not specified: Secondary | ICD-10-CM

## 2020-12-12 NOTE — Progress Notes (Signed)
Subjective:    Patient ID: Cristina Adkins, female    DOB: October 13, 1980, 40 y.o.   MRN: [id number]  HPI   Visit on 12/10/2020  Pt in for follow up from ED. She states she had head pressure. ED visit states below.  "Illness Location:  At home Quality:  Neck pain that radiates into the head Severity:  Severe Onset quality:  Sudden Duration:  9 hours Timing:  Constant Progression:  Improving Chronicity:  New Context:  Was sitting all day typing and hand pins and needles in the left hand and then stood up to do dishes and had posterior right neck pain that radiated into the base of the head and she went down.  Did not strike head, no LOC.  No changes in vision or speech or weakness.  No chest pain. Relieved by:  Nothing Worsened by:  Nothing Ineffective treatments:  None tried Associated symptoms: headaches   Associated symptoms: no abdominal pain, no chest pain, no congestion, no cough, no diarrhea, no ear pain, no fever, no loss of consciousness, no myalgias, no rash, no rhinorrhea, no shortness of breath, no sore throat, no vomiting and no wheezing   Risk factors:  HTN Patient states she was typing all day and left hand felt tingley like pins and needles then stood up to do the dishes and had neck pain that radiated into her posterior base of the head and this pain made her go to her knees.  No weakness, no changes in vision or speech or cognition.  No chest pain.  No shortness of breath.  No exertional symptoms.  No nausea, no vomiting, no diaphoresis."  She had CTA of head and neck which was normal.   Urinalysis showed urine and protein.  Pt infection fighting cells wre elevated.   Pt was given keflex and iv fluids. They told her she was dehdyrated.  Since leaving ED just states mild fatigue.     Review of Systems  Constitutional:  Positive for fatigue. Negative for chills and fever.  Respiratory:  Negative for cough, chest tightness, shortness of breath and wheezing.    Cardiovascular:  Negative for chest pain and palpitations.  Gastrointestinal:  Negative for abdominal pain.  Musculoskeletal:  Positive for back pain.       Mid faint occasional left cva area pain.  Neurological:  Negative for dizziness, syncope, weakness and headaches.       No symptoms now but states when arrived to ED had head pressure.  Psychiatric/Behavioral:  Negative for behavioral problems and confusion.    Past Medical History:  Diagnosis Date   History of alcohol abuse    Hypertension    Hypokalemia    Iron deficiency      Social History   Socioeconomic History   Marital status: Single    Spouse name: Not on file   Number of children: 1   Years of education: Not on file   Highest education level: Not on file  Occupational History   Occupation: Claim representative  Tobacco Use   Smoking status: Former    Packs/day: 1.00    Years: 9.00    Pack years: 9.00    Types: Cigarettes   Smokeless tobacco: Never  Vaping Use   Vaping Use: Never used  Substance and Sexual Activity   Alcohol use: No   Drug use: No   Sexual activity: Yes    Partners: Male    Birth control/protection: None  Other Topics Concern   Not  on file  Social History Narrative   Grandmother is irish/indian   2 half-sisters   2 sisters (full),    1/2 brother   Older brother same mom- brother has CP   3 1/2 younger brothers after me   1 daughter    Boyfriend   Works as a Futures trader   Completed associate degree in Theatre manager   No pets   Enjoys Clinical cytogeneticist   Social Determinants of Corporate investment banker Strain: Not on file  Food Insecurity: Not on file  Transportation Needs: Not on file  Physical Activity: Not on file  Stress: Not on file  Social Connections: Not on file  Intimate Partner Violence: Not on file    No past surgical history on file.  Family History  Problem Relation Age of Onset   Hypertension Mother    Hypertension Maternal  Grandmother    Diabetes Daughter    Lung cancer Daughter    Heart disease Maternal Uncle    Diabetes Maternal Uncle    Colon cancer Maternal Uncle     Allergies  Allergen Reactions   Latex     Irritated and burning skin   Other     Allergic to all fruit and raw tomatoes reaction face swells    Current Outpatient Medications on File Prior to Visit  Medication Sig Dispense Refill   amLODipine (NORVASC) 10 MG tablet Take 1 tablet (10 mg total) by mouth daily. 90 tablet 1   cephALEXin (KEFLEX) 500 MG capsule Take 1 capsule (500 mg total) by mouth 4 (four) times daily. 28 capsule 0   Ferrous Sulfate (IRON) 325 (65 Fe) MG TABS Take 1 tablet (325 mg total) by mouth every other day. 30 tablet 0   olmesartan-hydrochlorothiazide (BENICAR HCT) 40-25 MG tablet Take 1 tablet by mouth daily. 90 tablet 1   traZODone (DESYREL) 50 MG tablet Take 0.5-1 tablets (25-50 mg total) by mouth at bedtime as needed for sleep. 30 tablet 3   No current facility-administered medications on file prior to visit.    BP 139/89   Pulse 99   Temp 98.2 F (36.8 C)   Resp 18   Ht 5\' 1"  (1.549 m)   Wt (!) 302 lb 12.8 oz (137.3 kg)   LMP 11/20/2020 (Approximate)   SpO2 97%   BMI 57.21 kg/m       Objective:   Physical Exam  General Mental Status- Alert. General Appearance- Not in acute distress.   Skin General: Color- Normal Color. Moisture- Normal Moisture.  Neck Carotid Arteries- Normal color. Moisture- Normal Moisture. No carotid bruits. No JVD.  Chest and Lung Exam Auscultation: Breath Sounds:-Normal.  Cardiovascular Auscultation:Rythm- Regular. Murmurs & Other Heart Sounds:Auscultation of the heart reveals- No Murmurs.  Abdomen Inspection:-Inspeection Normal. Palpation/Percussion:Note:No mass. Palpation and Percussion of the abdomen reveal- Non Tender, Non Distended + BS, no rebound or guarding.    Neurologic Cranial Nerve exam:- CN III-XII intact(No nystagmus), symmetric  smile. Strength:- 5/5 equal and symmetric strength both upper and lower extremities.   Back- no cva area pain presently.      Assessment & Plan:   Patient Instructions  Recent head pressure resolved post ED evaluation.  You mention that they stated you were dehydrated and they gave you IV fluids.  CT studies of head and neck were negative.  Your blood pressure is borderline elevated presently.  Continue current BP medications.  If you have any recurrent head pressure sensation please check your blood pressure.  If that occurs and your blood pressures trending over 140/90 please let us know.  In that event would need BP med adjustment.  Bacteria found in urine without protein as well.  Emergency department started you on Keflex.  However I do not see that they did a culture.  Gave sample today and will have that culture sent out.  Continue Keflex.   Recent elevated WBCs and decreased kidney function labs.  Placing future CBC and metabolic panel to do well on Tuesday.  Please get scheduled for this test.  Also on review elevated sugar on recent labs also will include A1c.  Follow-up date to be determined after lab review.  Also keep regular scheduled appointment with your PCP.   Esperanza Richters, PA-C

## 2020-12-12 NOTE — Patient Instructions (Addendum)
Recent head pressure resolved post ED evaluation.  You mention that they stated you were dehydrated and they gave you IV fluids.  CT studies of head and neck were negative.  Your blood pressure is borderline elevated presently.  Continue current BP medications.  If you have any recurrent head pressure sensation please check your blood pressure.  If that occurs and your blood pressures trending over 140/90 please let us know.  In that event would need BP med adjustment.  Bacteria found in urine without protein as well.  Emergency department started you on Keflex.  However I do not see that they did a culture.  Gave sample today and will have that culture sent out.  Continue Keflex.   Recent elevated WBCs and decreased kidney function labs.  Placing future CBC and metabolic panel to do well on Tuesday.  Please get scheduled for this test.  Also on review elevated sugar on recent labs also will include A1c.  Follow-up date to be determined after lab review.  Also keep regular scheduled appointment with your PCP.

## 2020-12-13 LAB — URINE CULTURE
MICRO NUMBER:: 12651477
Result:: NO GROWTH
SPECIMEN QUALITY:: ADEQUATE

## 2020-12-17 ENCOUNTER — Ambulatory Visit: Payer: No Typology Code available for payment source | Admitting: Medical

## 2020-12-17 ENCOUNTER — Other Ambulatory Visit: Payer: Self-pay

## 2020-12-17 ENCOUNTER — Ambulatory Visit (INDEPENDENT_AMBULATORY_CARE_PROVIDER_SITE_OTHER): Payer: No Typology Code available for payment source | Admitting: Family

## 2020-12-17 VITALS — BP 143/88 | HR 103 | Temp 98.5°F | Resp 16 | Wt 303.0 lb

## 2020-12-17 DIAGNOSIS — R739 Hyperglycemia, unspecified: Secondary | ICD-10-CM | POA: Diagnosis not present

## 2020-12-17 DIAGNOSIS — N390 Urinary tract infection, site not specified: Secondary | ICD-10-CM | POA: Diagnosis not present

## 2020-12-17 DIAGNOSIS — R809 Proteinuria, unspecified: Secondary | ICD-10-CM | POA: Diagnosis not present

## 2020-12-17 NOTE — Assessment & Plan Note (Signed)
It sounds like her symptoms including weakness and leukocytosis noted in the ED were most likely related to UTI which was treated. A culture was not performed in the ED. Follow up culture was negative.

## 2020-12-17 NOTE — Assessment & Plan Note (Addendum)
New. Had finding of glucosuria in the ED. Will check A1C.

## 2020-12-17 NOTE — Progress Notes (Signed)
Subjective:   By signing my name below, I, Lyric Barr-McArthur, attest that this documentation has been prepared under the direction and in the presence of Debbrah Alar, NP, 12/17/2020   Patient ID: Cristina Adkins, female    DOB: May 14, 1980, 40 y.o.   MRN: [id number]  Chief Complaint  Patient presents with   Follow-up    Here for ed follow up   Proteinuria    Concerned about protein in uaine found in hospital.     HPI Patient is in today for an office visit.  ED visit: She went to the ED on 12/10/2020 for pressure and tightness in her lower back that was radiating into her head. She started experiencing hot flashes and weakness so she went to the ED. She reports that her "legs gave out under me" but she did not lose any consciousness. They told her she was dehydrated and that she had protein and glucose in her urine. They also performed a CT scan on her head and an EKG and the results were normal. Her kreatinine was high when checked which led to the diagnosis of dehydration. She then followed up with another provider within our office who sent her urine off for culture and it came back normal but elevated white blood cell counts. Recently, she reports feeling well without weakness.  Health Maintenance Due  Topic Date Due   Hepatitis C Screening  Never done   TETANUS/TDAP  Never done   PAP SMEAR-Modifier  Never done   Past Medical History:  Diagnosis Date   History of alcohol abuse    Hypertension    Hypokalemia    Iron deficiency     No past surgical history on file.  Family History  Problem Relation Age of Onset   Hypertension Mother    Hypertension Maternal Grandmother    Diabetes Daughter    Lung cancer Daughter    Heart disease Maternal Uncle    Diabetes Maternal Uncle    Colon cancer Maternal Uncle     Social History   Socioeconomic History   Marital status: Single    Spouse name: Not on file   Number of children: 1   Years of education: Not on file    Highest education level: Not on file  Occupational History   Occupation: Market researcher  Tobacco Use   Smoking status: Former    Packs/day: 1.00    Years: 9.00    Pack years: 9.00    Types: Cigarettes   Smokeless tobacco: Never  Vaping Use   Vaping Use: Never used  Substance and Sexual Activity   Alcohol use: No   Drug use: No   Sexual activity: Yes    Partners: Male    Birth control/protection: None  Other Topics Concern   Not on file  Social History Narrative   Grandmother is irish/indian   2 half-sisters   2 sisters (full),    1/2 brother   Older brother same mom- brother has CP   3 1/2 younger brothers after me   1 daughter    Boyfriend   Works as a Psychologist, forensic   Completed associate degree in Proofreader   No pets   Enjoys Barrister's clerk   Social Determinants of Radio broadcast assistant Strain: Not on file  Food Insecurity: Not on file  Transportation Needs: Not on file  Physical Activity: Not on file  Stress: Not on file  Social Connections: Not on file  Intimate Partner Violence:  Not on file    Outpatient Medications Prior to Visit  Medication Sig Dispense Refill   amLODipine (NORVASC) 10 MG tablet Take 1 tablet (10 mg total) by mouth daily. 90 tablet 1   Ferrous Sulfate (IRON) 325 (65 Fe) MG TABS Take 1 tablet (325 mg total) by mouth every other day. 30 tablet 0   olmesartan-hydrochlorothiazide (BENICAR HCT) 40-25 MG tablet Take 1 tablet by mouth daily. 90 tablet 1   traZODone (DESYREL) 50 MG tablet Take 0.5-1 tablets (25-50 mg total) by mouth at bedtime as needed for sleep. 30 tablet 3   cephALEXin (KEFLEX) 500 MG capsule Take 1 capsule (500 mg total) by mouth 4 (four) times daily. 28 capsule 0   No facility-administered medications prior to visit.    Allergies  Allergen Reactions   Latex     Irritated and burning skin   Other     Allergic to all fruit and raw tomatoes reaction face swells    Review of Systems   Musculoskeletal:  Positive for back pain and falls.  Neurological:  Positive for dizziness, weakness and headaches.      Objective:    Physical Exam Constitutional:      General: She is not in acute distress.    Appearance: Normal appearance. She is not ill-appearing.  HENT:     Head: Normocephalic and atraumatic.     Right Ear: External ear normal.     Left Ear: External ear normal.  Eyes:     Extraocular Movements: Extraocular movements intact.     Pupils: Pupils are equal, round, and reactive to light.  Cardiovascular:     Rate and Rhythm: Normal rate and regular rhythm.     Heart sounds: Normal heart sounds. No murmur heard.   No gallop.  Pulmonary:     Effort: Pulmonary effort is normal. No respiratory distress.     Breath sounds: Normal breath sounds. No wheezing or rales.  Skin:    General: Skin is warm and dry.  Neurological:     Mental Status: She is alert and oriented to person, place, and time.  Psychiatric:        Behavior: Behavior normal.        Judgment: Judgment normal.    BP (!) 143/88 (BP Location: Right Arm, Patient Position: Sitting, Cuff Size: Large)   Pulse (!) 103   Temp 98.5 F (36.9 C) (Oral)   Resp 16   Wt (!) 303 lb (137.4 kg)   LMP 11/20/2020 (Approximate)   SpO2 100%   BMI 57.25 kg/m  Wt Readings from Last 3 Encounters:  12/17/20 (!) 303 lb (137.4 kg)  12/12/20 (!) 302 lb 12.8 oz (137.3 kg)  12/10/20 289 lb (131.1 kg)       Assessment & Plan:   Problem List Items Addressed This Visit       Unprioritized   Urinary tract infection without hematuria    It sounds like her symptoms including weakness and leukocytosis noted in the ED were most likely related to UTI which was treated. A culture was not performed in the ED. Follow up culture was negative.       Proteinuria    New finding.  Will repeat urinalysis with micro today to confirm.       Relevant Orders   Urinalysis, Routine w reflex microscopic   Hyperglycemia -  Primary    New. Had finding of glucosuria in the ED. Will check A1C.       Relevant  Orders   Comp Met (CMET)   Hemoglobin A1c   No orders of the defined types were placed in this encounter.   I, Debbrah Alar, NP, personally preformed the services described in this documentation.  All medical record entries made by the scribe were at my direction and in my presence.  I have reviewed the chart and discharge instructions (if applicable) and agree that the record reflects my personal performance and is accurate and complete. 12/17/2020  I,Lyric Barr-McArthur,acting as a Education administrator for Nance Pear, NP.,have documented all relevant documentation on the behalf of Nance Pear, NP,as directed by  Nance Pear, NP while in the presence of Nance Pear, NP.  Nance Pear, NP

## 2020-12-17 NOTE — Patient Instructions (Signed)
Please complete lab work prior to leaving.   

## 2020-12-17 NOTE — Assessment & Plan Note (Signed)
New finding.  Will repeat urinalysis with micro today to confirm.

## 2020-12-18 LAB — HEMOGLOBIN A1C: Hgb A1c MFr Bld: 5.9 % (ref 4.6–6.5)

## 2020-12-18 LAB — COMPREHENSIVE METABOLIC PANEL
ALT: 16 U/L (ref 0–35)
AST: 15 U/L (ref 0–37)
Albumin: 3.8 g/dL (ref 3.5–5.2)
Alkaline Phosphatase: 71 U/L (ref 39–117)
BUN: 22 mg/dL (ref 6–23)
CO2: 26 mEq/L (ref 19–32)
Calcium: 9.1 mg/dL (ref 8.4–10.5)
Chloride: 104 mEq/L (ref 96–112)
Creatinine, Ser: 0.98 mg/dL (ref 0.40–1.20)
GFR: 72.82 mL/min (ref >60.00)
Glucose, Bld: 103 mg/dL — ABNORMAL HIGH (ref 70–99)
Potassium: 4.5 mEq/L (ref 3.5–5.1)
Sodium: 136 mEq/L (ref 135–145)
Total Bilirubin: 0.2 mg/dL (ref 0.2–1.2)
Total Protein: 7.4 g/dL (ref 6.0–8.3)

## 2020-12-18 LAB — URINALYSIS, ROUTINE W REFLEX MICROSCOPIC
Bilirubin Urine: NEGATIVE
Hgb urine dipstick: NEGATIVE
Ketones, ur: NEGATIVE
Leukocytes,Ua: NEGATIVE
Nitrite: NEGATIVE
RBC / HPF: NONE SEEN (ref 0–?)
Specific Gravity, Urine: 1.02 (ref 1.000–1.030)
Total Protein, Urine: NEGATIVE
Urine Glucose: NEGATIVE
Urobilinogen, UA: 0.2 (ref 0.0–1.0)
pH: 6 (ref 5.0–8.0)

## 2021-01-08 ENCOUNTER — Other Ambulatory Visit: Payer: Self-pay

## 2021-01-08 ENCOUNTER — Encounter (HOSPITAL_BASED_OUTPATIENT_CLINIC_OR_DEPARTMENT_OTHER): Payer: Self-pay | Admitting: Emergency Medicine

## 2021-01-08 ENCOUNTER — Emergency Department (HOSPITAL_BASED_OUTPATIENT_CLINIC_OR_DEPARTMENT_OTHER)
Admission: EM | Admit: 2021-01-08 | Discharge: 2021-01-08 | Disposition: A | Discharge status: Home / Self Care | Payer: No Typology Code available for payment source | Attending: Emergency Medicine | Admitting: Emergency Medicine

## 2021-01-08 DIAGNOSIS — U071 COVID-19: Secondary | ICD-10-CM | POA: Insufficient documentation

## 2021-01-08 DIAGNOSIS — I10 Essential (primary) hypertension: Secondary | ICD-10-CM | POA: Insufficient documentation

## 2021-01-08 DIAGNOSIS — R000 Tachycardia, unspecified: Secondary | ICD-10-CM | POA: Diagnosis not present

## 2021-01-08 DIAGNOSIS — Z87891 Personal history of nicotine dependence: Secondary | ICD-10-CM | POA: Diagnosis not present

## 2021-01-08 DIAGNOSIS — Z91040 Latex allergy status: Secondary | ICD-10-CM | POA: Diagnosis not present

## 2021-01-08 DIAGNOSIS — J029 Acute pharyngitis, unspecified: Secondary | ICD-10-CM | POA: Diagnosis present

## 2021-01-08 DIAGNOSIS — Z79899 Other long term (current) drug therapy: Secondary | ICD-10-CM | POA: Insufficient documentation

## 2021-01-08 DIAGNOSIS — J028 Acute pharyngitis due to other specified organisms: Secondary | ICD-10-CM

## 2021-01-08 LAB — RESP PANEL BY RT-PCR (FLU A&B, COVID) ARPGX2
Influenza A by PCR: NEGATIVE
Influenza B by PCR: NEGATIVE
SARS Coronavirus 2 by RT PCR: POSITIVE — AB

## 2021-01-08 MED ORDER — ACETAMINOPHEN 325 MG PO TABS
650.0000 mg | ORAL_TABLET | Freq: Once | ORAL | Status: AC
  Administered 2021-01-08: 21:00:00 650 mg via ORAL
  Filled 2021-01-08: qty 2

## 2021-01-08 NOTE — ED Provider Notes (Signed)
[facility] EMERGENCY DEPARTMENT Provider Note   CSN: [id number] Arrival date & time: 01/08/21  2040     History Chief Complaint  Patient presents with   Covid Exposure    Cristina Adkins is a 40 y.o. female.  Patient exposed to relative that has COVID. Patient developed a headache, sore throat, and myalgias late Monday. Occasional cough. No shortness of breath, chest pain, abdominal pain, nausea, vomiting of diarrhea.   URI Presenting symptoms: fatigue, fever and sore throat   Duration:  3 days Progression:  Waxing and waning Chronicity:  New Associated symptoms: headaches and myalgias   Risk factors: sick contacts   Risk factors: no chronic kidney disease, no chronic respiratory disease, no diabetes mellitus and no immunosuppression       Past Medical History:  Diagnosis Date   History of alcohol abuse    Hypertension    Hypokalemia    Iron deficiency     Patient Active Problem List   Diagnosis Date Noted   Hyperglycemia 12/17/2020   Proteinuria 12/17/2020   Urinary tract infection without hematuria 12/17/2020   Anxiety and depression 09/18/2020   Hypertension 09/18/2020   Insomnia 09/18/2020   Iron deficiency    Cardiomegaly 03/29/2017   History of tobacco abuse 03/29/2017   Anemia 03/29/2017   Heavy menstrual bleeding 03/29/2017    History reviewed. No pertinent surgical history.   OB History     Gravida  1   Para      Term      Preterm      AB      Living         SAB      IAB      Ectopic      Multiple      Live Births              Family History  Problem Relation Age of Onset   Hypertension Mother    Hypertension Maternal Grandmother    Diabetes Daughter    Lung cancer Daughter    Heart disease Maternal Uncle    Diabetes Maternal Uncle    Colon cancer Maternal Uncle     Social History   Tobacco Use   Smoking status: Former    Packs/day: 1.00    Years: 9.00    Pack years: 9.00    Types: Cigarettes     Quit date: 11/21/2020    Years since quitting: 0.1   Smokeless tobacco: Never  Vaping Use   Vaping Use: Some days   Substances: Nicotine  Substance Use Topics   Alcohol use: No   Drug use: No    Home Medications Prior to Admission medications   Medication Sig Start Date End Date Taking? Authorizing Provider  amLODipine (NORVASC) 10 MG tablet Take 1 tablet (10 mg total) by mouth daily. 09/20/20   Sandford Craze, NP  Ferrous Sulfate (IRON) 325 (65 Fe) MG TABS Take 1 tablet (325 mg total) by mouth every other day. 05/10/19   Sandford Craze, NP  olmesartan-hydrochlorothiazide (BENICAR HCT) 40-25 MG tablet Take 1 tablet by mouth daily. 09/20/20   Sandford Craze, NP  traZODone (DESYREL) 50 MG tablet Take 0.5-1 tablets (25-50 mg total) by mouth at bedtime as needed for sleep. 10/16/20   Sandford Craze, NP    Allergies    Latex and Other  Review of Systems   Review of Systems  Constitutional:  Positive for fatigue and fever.  HENT:  Positive for sore throat.  Respiratory:  Negative for shortness of breath.   Musculoskeletal:  Positive for myalgias.  Neurological:  Positive for headaches.  All other systems reviewed and are negative.  Physical Exam Updated Vital Signs BP (!) 144/98 (BP Location: Right Wrist)    Pulse (!) 111    Temp 100.2 F (37.9 C) (Oral)    Resp 20    Ht 5\' 1"  (1.549 m)    Wt 131.1 kg    LMP 12/26/2020 (Exact Date)    SpO2 97%    BMI 54.61 kg/m   Physical Exam Constitutional:      Appearance: Normal appearance.  HENT:     Head: Normocephalic.     Mouth/Throat:     Mouth: Mucous membranes are moist.     Pharynx: Posterior oropharyngeal erythema present. No oropharyngeal exudate.  Cardiovascular:     Rate and Rhythm: Tachycardia present.  Pulmonary:     Effort: Pulmonary effort is normal.     Breath sounds: Normal breath sounds.  Abdominal:     Palpations: Abdomen is soft.  Musculoskeletal:        General: Normal range of motion.      Cervical back: Normal range of motion. No tenderness.  Lymphadenopathy:     Cervical: No cervical adenopathy.  Skin:    General: Skin is warm and dry.  Neurological:     General: No focal deficit present.     Mental Status: She is alert and oriented to person, place, and time.  Psychiatric:        Mood and Affect: Mood normal.        Behavior: Behavior normal.    ED Results / Procedures / Treatments   Labs (all labs ordered are listed, but only abnormal results are displayed) Labs Reviewed  RESP PANEL BY RT-PCR (FLU A&B, COVID) ARPGX2 - Abnormal; Notable for the following components:      Result Value   SARS Coronavirus 2 by RT PCR POSITIVE (*)    All other components within normal limits    EKG None  Radiology No results found.  Procedures Procedures   Medications Ordered in ED Medications  acetaminophen (TYLENOL) tablet 650 mg (650 mg Oral Given 01/08/21 2108)    ED Course  I have reviewed the triage vital signs and the nursing notes.  Pertinent labs & imaging results that were available during my care of the patient were reviewed by me and considered in my medical decision making (see chart for details).    MDM Rules/Calculators/A&P                           Pt symptoms consistent with URI. Respiratory panel positive for COVID. Patient is not high risk. Pt will be discharged with symptomatic treatment.  Discussed return precautions.  Pt is hemodynamically stable & in NAD prior to discharge.     Final Clinical Impression(s) / ED Diagnoses Final diagnoses:  COVID-19  Pharyngitis due to other organism    Rx / DC Orders ED Discharge Orders     None        Etta Quill, NP 01/08/21 [place], DO 01/08/21 2250

## 2021-01-08 NOTE — ED Triage Notes (Signed)
Pt states her niece has Covid  Pt states she woke up with headache  Tuesday had headache and sore throat  today pt is only having sore throat states it is getting hard to swallow  Pt has had a fever since Monday  Pt has dry cough  Denies chest congestion

## 2021-01-08 NOTE — Discharge Instructions (Addendum)
Please refer to the attached instructions 

## 2021-03-05 ENCOUNTER — Other Ambulatory Visit (HOSPITAL_COMMUNITY)
Admission: RE | Admit: 2021-03-05 | Discharge: 2021-03-05 | Disposition: A | Discharge status: Home / Self Care | Payer: No Typology Code available for payment source | Source: Ambulatory Visit | Attending: Family | Admitting: Family

## 2021-03-05 ENCOUNTER — Encounter: Payer: Self-pay | Admitting: Family

## 2021-03-05 ENCOUNTER — Ambulatory Visit (INDEPENDENT_AMBULATORY_CARE_PROVIDER_SITE_OTHER): Payer: No Typology Code available for payment source | Admitting: Family

## 2021-03-05 VITALS — BP 133/85 | HR 88 | Temp 97.8°F | Resp 16 | Ht 61.0 in | Wt 303.8 lb

## 2021-03-05 DIAGNOSIS — Z01419 Encounter for gynecological examination (general) (routine) without abnormal findings: Secondary | ICD-10-CM | POA: Diagnosis not present

## 2021-03-05 DIAGNOSIS — D509 Iron deficiency anemia, unspecified: Secondary | ICD-10-CM | POA: Diagnosis not present

## 2021-03-05 DIAGNOSIS — I10 Essential (primary) hypertension: Secondary | ICD-10-CM | POA: Diagnosis not present

## 2021-03-05 DIAGNOSIS — Z0000 Encounter for general adult medical examination without abnormal findings: Secondary | ICD-10-CM | POA: Insufficient documentation

## 2021-03-05 MED ORDER — AMLODIPINE BESYLATE 5 MG PO TABS: 5.0000 mg | ORAL_TABLET | Freq: Every day | ORAL | 1 refills | Status: DC

## 2021-03-05 NOTE — Assessment & Plan Note (Signed)
Not taking iron supplement. Will check CBC, iron studies.

## 2021-03-05 NOTE — Patient Instructions (Signed)
Please restart amlodipine 5mg .

## 2021-03-05 NOTE — Progress Notes (Signed)
Subjective:   By signing my name below, I, Zite Okoli, attest that this documentation has been prepared under the direction and in the presence of Debbrah Alar, NP 03/05/2021     Patient ID: Cristina Adkins, female    DOB: [birth year], 41 y.o.   MRN: [id number]  Chief Complaint  Patient presents with   Annual Exam    HPI Patient is in today for a comprehensive physical exam.  Hypertension- Her blood pressure is stable at today's visit. She has not taken 10 mg amlodipine and 40-25 mg benicar for the past 3 weeks. BP Readings from Last 3 Encounters:  03/05/21 133/85  01/08/21 (!) 144/98  12/17/20 (!) 143/88    Anemia- She has not been taking iron pills. Her periods have been regular and normal flow.  Sleep- She did not start using 50 mg trazodone and has been sleeping properly.  Immunizations- She is not interested in immunizations. Adds she received the tetanus vaccine in 2019.  Diet and Exercise- She is eating a lot of vegetables but cannot eat any fruits due to her allergies. She goes for 2-3 weeks without meat. She is mostly vegetarian but eats baked chicken. She exercises by walking her dogs and at her second job. She is interested in meeting with a nutritionist.   Dental and Vision- She is UTD on both.  Social History- She is not drinking alcohol. She does not smoke or use tobacco products. She is not currently sexually active.   No recent surgeries. Her dad has stage 4 prostate cancer. Her oldest brother was diagnosed with hypertension.  She denies having any unexpected weight change, ear pain, hearing loss and rhinorrhea, visual disturbance, cough, chest pain and leg swelling, nausea, vomiting, diarrhea and blood in stool, or dysuria and frequency, for myalgias and arthralgias, rash, headaches, adenopathy, depression or anxiety at this time    Past Medical History:  Diagnosis Date   History of alcohol abuse    Hypertension    Hypokalemia    Iron deficiency      History reviewed. No pertinent surgical history.  Family History  Problem Relation Age of Onset   Hypertension Mother    Prostate cancer Father    Hypertension Brother    Hypertension Maternal Grandmother    Diabetes Daughter    Lung cancer Daughter    Heart disease Maternal Uncle    Diabetes Maternal Uncle    Colon cancer Maternal Uncle     Social History   Socioeconomic History   Marital status: Single    Spouse name: Not on file   Number of children: 1   Years of education: Not on file   Highest education level: Not on file  Occupational History   Occupation: Market researcher  Tobacco Use   Smoking status: Former    Packs/day: 1.00    Years: 9.00    Pack years: 9.00    Types: Cigarettes    Quit date: 11/21/2020    Years since quitting: 0.2   Smokeless tobacco: Never  Vaping Use   Vaping Use: Some days   Substances: Nicotine  Substance and Sexual Activity   Alcohol use: No   Drug use: No   Sexual activity: Yes    Partners: Male    Birth control/protection: Abstinence  Other Topics Concern   Not on file  Social History Narrative   Grandmother is irish/indian   2 half-sisters   2 sisters (full)   1/2 brother   Older brother same mom-  brother has CP   3 1/2 younger brothers after me   1 daughter    Boyfriend   Works as a Futures trader   Completed associate degree in Theatre manager   No pets   Enjoys Clinical cytogeneticist   Social Determinants of Corporate investment banker Strain: Not on Ship broker Insecurity: Not on file  Transportation Needs: Not on file  Physical Activity: Not on file  Stress: Not on file  Social Connections: Not on file  Intimate Partner Violence: Not on file    Outpatient Medications Prior to Visit  Medication Sig Dispense Refill   amLODipine (NORVASC) 10 MG tablet Take 1 tablet (10 mg total) by mouth daily. 90 tablet 1   olmesartan-hydrochlorothiazide (BENICAR HCT) 40-25 MG tablet Take 1 tablet by  mouth daily. 90 tablet 1   traZODone (DESYREL) 50 MG tablet Take 0.5-1 tablets (25-50 mg total) by mouth at bedtime as needed for sleep. 30 tablet 3   Ferrous Sulfate (IRON) 325 (65 Fe) MG TABS Take 1 tablet (325 mg total) by mouth every other day. (Patient not taking: Reported on 03/05/2021) 30 tablet 0   No facility-administered medications prior to visit.    Allergies  Allergen Reactions   Latex     Irritated and burning skin   Other     Allergic to all fruit and raw tomatoes reaction face swells    Review of Systems  Constitutional:  Negative for fever.  HENT:  Negative for ear pain and hearing loss.        (-)nystagmus (-)adenopathy  Eyes:  Negative for blurred vision.  Respiratory:  Negative for cough, shortness of breath and wheezing.   Cardiovascular:  Negative for chest pain and leg swelling.  Gastrointestinal:  Negative for blood in stool, diarrhea, nausea and vomiting.  Genitourinary:  Negative for dysuria and frequency.  Musculoskeletal:  Negative for joint pain and myalgias.  Skin:  Negative for rash.  Neurological:  Negative for headaches.  Psychiatric/Behavioral:  Negative for depression. The patient is not nervous/anxious.       Objective:    Physical Exam Exam conducted with a chaperone present.  Constitutional:      General: She is not in acute distress.    Appearance: Normal appearance. She is not ill-appearing.  HENT:     Head: Normocephalic and atraumatic.     Right Ear: External ear normal.     Left Ear: External ear normal.  Eyes:     Extraocular Movements: Extraocular movements intact.     Pupils: Pupils are equal, round, and reactive to light.  Cardiovascular:     Rate and Rhythm: Normal rate and regular rhythm.     Pulses: Normal pulses.     Heart sounds: Normal heart sounds. No murmur heard. Pulmonary:     Effort: Pulmonary effort is normal. No respiratory distress.     Breath sounds: Normal breath sounds. No wheezing or rhonchi.  Chest:   Breasts:    Right: Normal.     Left: Normal.  Abdominal:     General: Bowel sounds are normal. There is no distension.     Palpations: Abdomen is soft.     Tenderness: There is no abdominal tenderness. There is no guarding or rebound.  Genitourinary:    General: Normal vulva.     Exam position: Lithotomy position.     Vagina: Normal.     Cervix: Normal.     Uterus: Normal.      Adnexa:  Right adnexa normal and left adnexa normal.     Comments: Pap smear done today Musculoskeletal:     Cervical back: Neck supple.  Lymphadenopathy:     Cervical: No cervical adenopathy.  Skin:    General: Skin is warm and dry.  Neurological:     Mental Status: She is alert and oriented to person, place, and time.  Psychiatric:        Behavior: Behavior normal.        Judgment: Judgment normal.    BP 133/85 (BP Location: Right Arm, Patient Position: Sitting, Cuff Size: Large)    Pulse 88    Temp 97.8 F (36.6 C) (Oral)    Resp 16    Ht 5\' 1"  (1.549 m)    Wt (!) 303 lb 12.8 oz (137.8 kg)    SpO2 98%    BMI 57.40 kg/m  Wt Readings from Last 3 Encounters:  03/05/21 (!) 303 lb 12.8 oz (137.8 kg)  01/08/21 289 lb (131.1 kg)  12/17/20 (!) 303 lb (137.4 kg)    Diabetic Foot Exam - Simple   No data filed    Lab Results  Component Value Date   WBC 15.1 (H) 12/10/2020   HGB 13.5 12/10/2020   HCT 42.7 12/10/2020   PLT 587 (H) 12/10/2020   GLUCOSE 103 (H) 12/17/2020   CHOL 69 04/05/2019   TRIG 138.0 04/05/2019   HDL 31.30 (L) 04/05/2019   LDLCALC 10 04/05/2019   ALT 16 12/17/2020   AST 15 12/17/2020   NA 136 12/17/2020   K 4.5 12/17/2020   CL 104 12/17/2020   CREATININE 0.98 12/17/2020   BUN 22 12/17/2020   CO2 26 12/17/2020   TSH 2.29 04/05/2019   HGBA1C 5.9 12/17/2020    Lab Results  Component Value Date   TSH 2.29 04/05/2019   Lab Results  Component Value Date   WBC 15.1 (H) 12/10/2020   HGB 13.5 12/10/2020   HCT 42.7 12/10/2020   MCV 77.9 (L) 12/10/2020   PLT 587 (H)  12/10/2020   Lab Results  Component Value Date   NA 136 12/17/2020   K 4.5 12/17/2020   CO2 26 12/17/2020   GLUCOSE 103 (H) 12/17/2020   BUN 22 12/17/2020   CREATININE 0.98 12/17/2020   BILITOT 0.2 12/17/2020   ALKPHOS 71 12/17/2020   AST 15 12/17/2020   ALT 16 12/17/2020   PROT 7.4 12/17/2020   ALBUMIN 3.8 12/17/2020   CALCIUM 9.1 12/17/2020   ANIONGAP 11 12/10/2020   GFR 72.82 12/17/2020   Lab Results  Component Value Date   CHOL 69 04/05/2019   Lab Results  Component Value Date   HDL 31.30 (L) 04/05/2019   Lab Results  Component Value Date   LDLCALC 10 04/05/2019   Lab Results  Component Value Date   TRIG 138.0 04/05/2019   Lab Results  Component Value Date   CHOLHDL 2 04/05/2019   Lab Results  Component Value Date   HGBA1C 5.9 12/17/2020       Assessment & Plan:   Problem List Items Addressed This Visit       Unprioritized   Preventative health care    Discussed healthy diet, exercise and weight loss. Pap performed today. She declines all vaccines.        Hypertension    BP Readings from Last 3 Encounters:  03/05/21 133/85  01/08/21 (!) 144/98  12/17/20 (!) 143/88  She has been off of all medications for 3 weeks. BP  looks OK.  Will resume amlodipine at 5mg  rather than 10 and have her remain off of Benicar HCT for now. Plan to repeat blood pressure in 1 week.       Relevant Medications   amLODipine (NORVASC) 5 MG tablet   Anemia - Primary    Not taking iron supplement. Will check CBC, iron studies.       Relevant Orders   CBC with Differential/Platelet   Iron   Ferritin   Other Visit Diagnoses     Morbid obesity ([place])       Relevant Orders   Amb ref to Medical Nutrition Therapy-MNT   Encounter for routine gynecological examination with Papanicolaou smear of cervix       Relevant Orders   Cytology - PAP( [place])        Meds ordered this encounter  Medications   amLODipine (NORVASC) 5 MG tablet    Sig: Take 1 tablet (5  mg total) by mouth daily.    Dispense:  90 tablet    Refill:  1    Order Specific Question:   Supervising Provider    Answer:   Penni Homans A [[id number]]    I,Zite Okoli,acting as a scribe for Nance Pear, NP.,have documented all relevant documentation on the behalf of Nance Pear, NP,as directed by  Nance Pear, NP while in the presence of Nance Pear, NP.   I, Debbrah Alar, NP , personally preformed the services described in this documentation.  All medical record entries made by the scribe were at my direction and in my presence.  I have reviewed the chart and discharge instructions (if applicable) and agree that the record reflects my personal performance and is accurate and complete. 03/05/2021

## 2021-03-05 NOTE — Assessment & Plan Note (Addendum)
BP Readings from Last 3 Encounters:  03/05/21 133/85  01/08/21 (!) 144/98  12/17/20 (!) 143/88   She has been off of all medications for 3 weeks. BP looks OK.  Will resume amlodipine at 5mg  rather than 10 and have her remain off of Benicar HCT for now. Plan to repeat blood pressure in 1 week.

## 2021-03-05 NOTE — Assessment & Plan Note (Signed)
Discussed healthy diet, exercise and weight loss. Pap performed today. She declines all vaccines.

## 2021-03-07 LAB — CYTOLOGY - PAP
Comment: NEGATIVE
Diagnosis: NEGATIVE
High risk HPV: NEGATIVE

## 2021-04-02 ENCOUNTER — Ambulatory Visit (INDEPENDENT_AMBULATORY_CARE_PROVIDER_SITE_OTHER): Payer: No Typology Code available for payment source | Admitting: Family

## 2021-04-02 DIAGNOSIS — I10 Essential (primary) hypertension: Secondary | ICD-10-CM | POA: Diagnosis not present

## 2021-04-02 MED ORDER — OLMESARTAN MEDOXOMIL-HCTZ 20-12.5 MG PO TABS: 1.0000 | ORAL_TABLET | Freq: Every day | ORAL | 2 refills | Status: DC

## 2021-04-02 MED ORDER — TIRZEPATIDE 2.5 MG/0.5ML ~~LOC~~ SOAJ: 2.5000 mg | SUBCUTANEOUS | 1 refills | Status: DC

## 2021-04-02 NOTE — Assessment & Plan Note (Signed)
Uncontrolled. Will add back in benicar-hct at a lower dose. Hopefully the hctz will help with her mild LE edema.  Plan follow back up in 2 weeks for bp recheck and follow up lab work.  ?

## 2021-04-02 NOTE — Progress Notes (Signed)
Subjective:   By signing my name below, I, [name], attest that this documentation has been prepared under the direction and in the presence of Sandford Craze, NP. 04/02/2021    Patient ID: Cristina Adkins, female    DOB: 1980/08/29, 41 y.o.   MRN: [id number]  Chief Complaint  Patient presents with   Hypertension    One month follow up   Foot Swelling    Complains of left foot swelling since starting Amlodipine 5 mg    Hypertension  Patient is in today for a follow up visit.   Hypertension- Her blood pressure is elevated during this visit. She reports it may be due to drinking a latte drink prior to this visit. She continues taking 5 mg amlodipine daily PO. During last visit she switched from 40-25 mg Benicar-HCT daily PO to 5 mg amlodipine daily PO. She reports reducing the amount of caffeine drinks since last visit. She also replaced caffeine at night with green tea.  She also complains of right foot swelling since switching medications last visit.  BP Readings from Last 3 Encounters:  04/02/21 (!) 152/95  03/05/21 133/85  01/08/21 (!) 144/98   Pulse Readings from Last 3 Encounters:  04/02/21 86  03/05/21 88  01/08/21 (!) 111   Diet- She is eating more vegetable in her diet but reports no losing weight. She has no issues with her thyroid since her last lab work. She has an upcomming appointment with a healthy weight and wellness clinic's nutritionist. She is interested in taking mounjaro to assist her weight loss.  Exercise- She is participating in regular exercise by walking her dogs in a park.  Wt Readings from Last 3 Encounters:  04/02/21 (!) 307 lb (139.3 kg)  03/05/21 (!) 303 lb 12.8 oz (137.8 kg)  01/08/21 289 lb (131.1 kg)    Health Maintenance Due  Topic Date Due   COVID-19 Vaccine (1) Never done   Hepatitis C Screening  Never done   TETANUS/TDAP  Never done    Past Medical History:  Diagnosis Date   History of alcohol abuse    Hypertension     Hypokalemia    Iron deficiency     No past surgical history on file.  Family History  Problem Relation Age of Onset   Hypertension Mother    Prostate cancer Father    Hypertension Brother    Hypertension Maternal Grandmother    Diabetes Daughter    Lung cancer Daughter    Heart disease Maternal Uncle    Diabetes Maternal Uncle    Colon cancer Maternal Uncle     Social History   Socioeconomic History   Marital status: Single    Spouse name: Not on file   Number of children: 1   Years of education: Not on file   Highest education level: Not on file  Occupational History   Occupation: Advice worker  Tobacco Use   Smoking status: Former    Packs/day: 1.00    Years: 9.00    Pack years: 9.00    Types: Cigarettes    Quit date: 11/21/2020    Years since quitting: 0.3   Smokeless tobacco: Never  Vaping Use   Vaping Use: Some days   Substances: Nicotine  Substance and Sexual Activity   Alcohol use: No   Drug use: No   Sexual activity: Yes    Partners: Male    Birth control/protection: Abstinence  Other Topics Concern   Not on file  Social  History Narrative   Grandmother is irish/indian   2 half-sisters   2 sisters (full)   1/2 brother   Older brother same mom- brother has CP   3 1/2 younger brothers after me   1 daughter    Boyfriend   Works as a Futures traderclaims analyst processor/adjuster   Completed associate degree in Theatre managerbusiness management   No pets   Enjoys Clinical cytogeneticistcrafting   Social Determinants of Corporate investment bankerHealth   Financial Resource Strain: Not on file  Food Insecurity: Not on file  Transportation Needs: Not on file  Physical Activity: Not on file  Stress: Not on file  Social Connections: Not on file  Intimate Partner Violence: Not on file    Outpatient Medications Prior to Visit  Medication Sig Dispense Refill   amLODipine (NORVASC) 5 MG tablet Take 1 tablet (5 mg total) by mouth daily. 90 tablet 1   No facility-administered medications prior to visit.     Allergies  Allergen Reactions   Latex     Irritated and burning skin   Other     Allergic to all fruit and raw tomatoes reaction face swells    Review of Systems  Cardiovascular:        (+)swelling in right foot      Objective:    Physical Exam Constitutional:      General: She is not in acute distress.    Appearance: Normal appearance. She is not ill-appearing.  HENT:     Head: Normocephalic and atraumatic.     Right Ear: External ear normal.     Left Ear: External ear normal.  Eyes:     Extraocular Movements: Extraocular movements intact.     Pupils: Pupils are equal, round, and reactive to light.  Cardiovascular:     Rate and Rhythm: Normal rate and regular rhythm.     Heart sounds: Normal heart sounds. No murmur heard.   No gallop.  Pulmonary:     Effort: Pulmonary effort is normal. No respiratory distress.     Breath sounds: Normal breath sounds. No wheezing or rales.  Musculoskeletal:     Right lower leg: 1+ Edema present.     Left lower leg: 1+ Edema present.  Skin:    General: Skin is warm and dry.  Neurological:     Mental Status: She is alert and oriented to person, place, and time.  Psychiatric:        Judgment: Judgment normal.    BP (!) 152/95 (BP Location: Right Arm, Patient Position: Sitting, Cuff Size: Large)    Pulse 86    Temp 97.8 F (36.6 C) (Oral)    Resp 16    Wt (!) 307 lb (139.3 kg)    SpO2 100%    BMI 58.01 kg/m  Wt Readings from Last 3 Encounters:  04/02/21 (!) 307 lb (139.3 kg)  03/05/21 (!) 303 lb 12.8 oz (137.8 kg)  01/08/21 289 lb (131.1 kg)    Diabetic Foot Exam - Simple   No data filed    Lab Results  Component Value Date   WBC 15.1 (H) 12/10/2020   HGB 13.5 12/10/2020   HCT 42.7 12/10/2020   PLT 587 (H) 12/10/2020   GLUCOSE 103 (H) 12/17/2020   CHOL 69 04/05/2019   TRIG 138.0 04/05/2019   HDL 31.30 (L) 04/05/2019   LDLCALC 10 04/05/2019   ALT 16 12/17/2020   AST 15 12/17/2020   NA 136 12/17/2020   K 4.5  12/17/2020   CL 104  12/17/2020   CREATININE 0.98 12/17/2020   BUN 22 12/17/2020   CO2 26 12/17/2020   TSH 2.29 04/05/2019   HGBA1C 5.9 12/17/2020    Lab Results  Component Value Date   TSH 2.29 04/05/2019   Lab Results  Component Value Date   WBC 15.1 (H) 12/10/2020   HGB 13.5 12/10/2020   HCT 42.7 12/10/2020   MCV 77.9 (L) 12/10/2020   PLT 587 (H) 12/10/2020   Lab Results  Component Value Date   NA 136 12/17/2020   K 4.5 12/17/2020   CO2 26 12/17/2020   GLUCOSE 103 (H) 12/17/2020   BUN 22 12/17/2020   CREATININE 0.98 12/17/2020   BILITOT 0.2 12/17/2020   ALKPHOS 71 12/17/2020   AST 15 12/17/2020   ALT 16 12/17/2020   PROT 7.4 12/17/2020   ALBUMIN 3.8 12/17/2020   CALCIUM 9.1 12/17/2020   ANIONGAP 11 12/10/2020   GFR 72.82 12/17/2020   Lab Results  Component Value Date   CHOL 69 04/05/2019   Lab Results  Component Value Date   HDL 31.30 (L) 04/05/2019   Lab Results  Component Value Date   LDLCALC 10 04/05/2019   Lab Results  Component Value Date   TRIG 138.0 04/05/2019   Lab Results  Component Value Date   CHOLHDL 2 04/05/2019   Lab Results  Component Value Date   HGBA1C 5.9 12/17/2020       Assessment & Plan:   Problem List Items Addressed This Visit       Unprioritized   Morbid obesity (HCC) - Primary    She has an upcoming appointment with the nutritionist. She is interested in [place]. We discussed side effects/risks.  Will see if her insurance will approve it.       Relevant Medications   tirzepatide [facility]) 2.5 MG/0.5ML Pen   Hypertension    Uncontrolled. Will add back in benicar-hct at a lower dose. Hopefully the hctz will help with her mild LE edema.  Plan follow back up in 2 weeks for bp recheck and follow up lab work.       Relevant Medications   olmesartan-hydrochlorothiazide (BENICAR HCT) 20-12.5 MG tablet     Meds ordered this encounter  Medications   olmesartan-hydrochlorothiazide (BENICAR HCT) 20-12.5 MG tablet     Sig: Take 1 tablet by mouth daily.    Dispense:  30 tablet    Refill:  2    Order Specific Question:   Supervising Provider    Answer:   Danise Edge A [[id number]]   tirzepatide (MOUNJARO) 2.5 MG/0.5ML Pen    Sig: Inject 2.5 mg into the skin once a week.    Dispense:  2 mL    Refill:  1    Order Specific Question:   Supervising Provider    Answer:   Danise Edge A [[id number]]    I, Lemont Fillers, NP, personally preformed the services described in this documentation.  All medical record entries made by the scribe were at my direction and in my presence.  I have reviewed the chart and discharge instructions (if applicable) and agree that the record reflects my personal performance and is accurate and complete. 04/02/2021   I,[name],acting as a scribe for Lemont Fillers, NP.,have documented all relevant documentation on the behalf of Lemont Fillers, NP,as directed by  Lemont Fillers, NP while in the presence of Lemont Fillers, NP.   Lemont Fillers, NP

## 2021-04-02 NOTE — Patient Instructions (Signed)
Please continue amlodipine 5mg .  Add benicar hct 20mg -12.5mg . ? ?

## 2021-04-02 NOTE — Assessment & Plan Note (Signed)
She has an upcoming appointment with the nutritionist. She is interested in [place]. We discussed side effects/risks.  Will see if her insurance will approve it.  ?

## 2021-04-03 ENCOUNTER — Telehealth: Payer: Self-pay

## 2021-04-03 NOTE — Telephone Encounter (Signed)
PA initiated via Covermymeds; KEY: [id number]. Awaiting determination.  ?

## 2021-04-03 NOTE — Telephone Encounter (Signed)
PA approved.  ? ?Request Reference Number: [id number]. MOUNJARO INJ 2.5/0.5 is approved through 04/04/2022. Your patient may now fill this prescription and it will be covered. ?

## 2021-04-07 NOTE — Telephone Encounter (Signed)
Called patient but no answer, MyChart message sent to patient with this information. ?

## 2021-04-16 ENCOUNTER — Ambulatory Visit (INDEPENDENT_AMBULATORY_CARE_PROVIDER_SITE_OTHER): Payer: No Typology Code available for payment source | Admitting: Family

## 2021-04-16 VITALS — BP 154/98 | HR 77 | Temp 98.5°F | Resp 16 | Wt 312.0 lb

## 2021-04-16 DIAGNOSIS — I10 Essential (primary) hypertension: Secondary | ICD-10-CM | POA: Diagnosis not present

## 2021-04-16 LAB — BASIC METABOLIC PANEL
BUN: 15 mg/dL (ref 6–23)
CO2: 27 mEq/L (ref 19–32)
Calcium: 8.9 mg/dL (ref 8.4–10.5)
Chloride: 102 mEq/L (ref 96–112)
Creatinine, Ser: 0.96 mg/dL (ref 0.40–1.20)
GFR: 74.47 mL/min (ref >60.00)
Glucose, Bld: 74 mg/dL (ref 70–99)
Potassium: 4.1 mEq/L (ref 3.5–5.1)
Sodium: 136 mEq/L (ref 135–145)

## 2021-04-16 MED ORDER — TIRZEPATIDE 5 MG/0.5ML ~~LOC~~ SOAJ: 5.0000 mg | SUBCUTANEOUS | 0 refills | Status: DC

## 2021-04-16 MED ORDER — OLMESARTAN MEDOXOMIL-HCTZ 40-25 MG PO TABS
1.0000 | ORAL_TABLET | Freq: Every day | ORAL | 2 refills | Status: DC
Start: 2021-04-16 — End: 2021-07-28

## 2021-04-16 NOTE — Assessment & Plan Note (Addendum)
She has not yet lost any weight. Nutritional counseling provided.  ? ?Pt is advised as follows: ? ?Please increase your benicar-HCT to 40-25mg . ?Continue moujaro 2.5mg  for a total of 4 weeks, then increase to 5mg  once weekly.  ?

## 2021-04-16 NOTE — Patient Instructions (Addendum)
Please increase your benicar-HCT to 40-25mg . ?Continue moujaro 2.5mg  for a total of 4 weeks, then increase to 5mg  once weekly.  ?

## 2021-04-16 NOTE — Assessment & Plan Note (Signed)
BP still above goal. Continue current dose of amlodipine. Will increase benicar HCT from 20-12.5 to 40-25mg  once daily. Check bmet today and plan to bring her back in 2 weeks for bp recheck.  ?

## 2021-04-16 NOTE — Progress Notes (Signed)
? ?Subjective:  ? ? ? Patient ID: Cristina Adkins, female    DOB: 06/03/1980, 41 y.o.   MRN: [id number] ? ?Chief Complaint  ?Patient presents with  ? Hypertension  ?  Here for follow up  ? ? ?Hypertension ? ?Patient is in today for follow up. ? ?HTN- last visit we added back benicar hct.  She is tolerating dose without side effects.  ?BP Readings from Last 3 Encounters:  ?04/16/21 (!) 154/98  ?04/02/21 (!) 152/95  ?03/05/21 133/85  ? ?Morbid Obesity- notes that she was able to get mounjaro through her insurance. She is tolerating 2.5mg  weekly without side effect. No change in appetite yet.  ?Wt Readings from Last 3 Encounters:  ?04/16/21 (!) 312 lb (141.5 kg)  ?04/02/21 (!) 307 lb (139.3 kg)  ?03/05/21 (!) 303 lb 12.8 oz (137.8 kg)  ? ? ?24 hr diet recall: ? ?Diet Green Tea ? ?[place] food for lunch- chicken and veggies (no rice) ? ?Taco at 12 am ? ?Walking ? ? ?Health Maintenance Due  ?Topic Date Due  ? COVID-19 Vaccine (1) Never done  ? Hepatitis C Screening  Never done  ? TETANUS/TDAP  Never done  ? ? ?Past Medical History:  ?Diagnosis Date  ? History of alcohol abuse   ? Hypertension   ? Hypokalemia   ? Iron deficiency   ? ? ?No past surgical history on file. ? ?Family History  ?Problem Relation Age of Onset  ? Hypertension Mother   ? Prostate cancer Father   ? Hypertension Brother   ? Hypertension Maternal Grandmother   ? Diabetes Daughter   ? Lung cancer Daughter   ? Heart disease Maternal Uncle   ? Diabetes Maternal Uncle   ? Colon cancer Maternal Uncle   ? ? ?Social History  ? ?Socioeconomic History  ? Marital status: Single  ?  Spouse name: Not on file  ? Number of children: 1  ? Years of education: Not on file  ? Highest education level: Not on file  ?Occupational History  ? Occupation: Market researcher  ?Tobacco Use  ? Smoking status: Former  ?  Packs/day: 1.00  ?  Years: 9.00  ?  Pack years: 9.00  ?  Types: Cigarettes  ?  Quit date: 11/21/2020  ?  Years since quitting: 0.4  ? Smokeless tobacco: Never   ?Vaping Use  ? Vaping Use: Some days  ? Substances: Nicotine  ?Substance and Sexual Activity  ? Alcohol use: No  ? Drug use: No  ? Sexual activity: Yes  ?  Partners: Male  ?  Birth control/protection: Abstinence  ?Other Topics Concern  ? Not on file  ?Social History Narrative  ? Grandmother is irish/indian  ? 2 half-sisters  ? 2 sisters (full)  ? 1/2 brother  ? Older brother same mom- brother has CP  ? 3 1/2 younger brothers after me  ? 1 daughter   ? Boyfriend  ? Works as a Psychologist, forensic  ? Completed associate degree in business management  ? No pets  ? Enjoys crafting  ? ?Social Determinants of Health  ? ?Financial Resource Strain: Not on file  ?Food Insecurity: Not on file  ?Transportation Needs: Not on file  ?Physical Activity: Not on file  ?Stress: Not on file  ?Social Connections: Not on file  ?Intimate Partner Violence: Not on file  ? ? ?Outpatient Medications Prior to Visit  ?Medication Sig Dispense Refill  ? amLODipine (NORVASC) 5 MG tablet Take 1 tablet (5  mg total) by mouth daily. 90 tablet 1  ? olmesartan-hydrochlorothiazide (BENICAR HCT) 20-12.5 MG tablet Take 1 tablet by mouth daily. 30 tablet 2  ? tirzepatide (MOUNJARO) 2.5 MG/0.5ML Pen Inject 2.5 mg into the skin once a week. 2 mL 1  ? ?No facility-administered medications prior to visit.  ? ? ?Allergies  ?Allergen Reactions  ? Latex   ?  Irritated and burning skin  ? Other   ?  Allergic to all fruit and raw tomatoes reaction face swells  ? ? ?ROS ? ?   ?Objective:  ?  ?Physical Exam ?Constitutional:   ?   General: She is not in acute distress. ?   Appearance: Normal appearance. She is well-developed.  ?HENT:  ?   Head: Normocephalic and atraumatic.  ?   Right Ear: External ear normal.  ?   Left Ear: External ear normal.  ?Eyes:  ?   General: No scleral icterus. ?Neck:  ?   Thyroid: No thyromegaly.  ?Cardiovascular:  ?   Rate and Rhythm: Normal rate and regular rhythm.  ?   Heart sounds: Normal heart sounds. No murmur  heard. ?Pulmonary:  ?   Effort: Pulmonary effort is normal. No respiratory distress.  ?   Breath sounds: Normal breath sounds. No wheezing.  ?Musculoskeletal:  ?   Cervical back: Neck supple.  ?Skin: ?   General: Skin is warm and dry.  ?Neurological:  ?   Mental Status: She is alert and oriented to person, place, and time.  ?Psychiatric:     ?   Mood and Affect: Mood normal.     ?   Behavior: Behavior normal.     ?   Thought Content: Thought content normal.     ?   Judgment: Judgment normal.  ? ? ?BP (!) 154/98 (BP Location: Right Arm, Patient Position: Sitting, Cuff Size: Large)   Pulse 77   Temp 98.5 ?F (36.9 ?C) (Oral)   Resp 16   Wt (!) 312 lb (141.5 kg)   SpO2 100%   BMI 58.95 kg/m?  ?Wt Readings from Last 3 Encounters:  ?04/16/21 (!) 312 lb (141.5 kg)  ?04/02/21 (!) 307 lb (139.3 kg)  ?03/05/21 (!) 303 lb 12.8 oz (137.8 kg)  ? ? ?   ?Assessment & Plan:  ? ?Problem List Items Addressed This Visit   ? ?  ? Unprioritized  ? Morbid obesity ([place])  ?  She has not yet lost any weight. Nutritional counseling provided.  ? ?Pt is advised as follows: ? ?Please increase your benicar-HCT to 40-25mg . ?Continue moujaro 2.5mg  for a total of 4 weeks, then increase to 5mg  once weekly.  ?  ?  ? Relevant Medications  ? tirzepatide [facility]) 5 MG/0.5ML Pen  ? Hypertension - Primary  ?  BP still above goal. Continue current dose of amlodipine. Will increase benicar HCT from 20-12.5 to 40-25mg  once daily. Check bmet today and plan to bring her back in 2 weeks for bp recheck.  ?  ?  ? Relevant Medications  ? olmesartan-hydrochlorothiazide (BENICAR HCT) 40-25 MG tablet  ? Other Relevant Orders  ? Basic metabolic panel  ? ? ?I have discontinued Cristina Adkins "Denise"'s olmesartan-hydrochlorothiazide and tirzepatide. I am also having her start on tirzepatide and olmesartan-hydrochlorothiazide. Additionally, I am having her maintain her amLODipine. ? ?Meds ordered this encounter  ?Medications  ? tirzepatide [facility]) 5 MG/0.5ML  Pen  ?  Sig: Inject 5 mg into the skin once a week.  ?  Dispense:  2 mL  ?  Refill:  0  ?  Order Specific Question:   Supervising Provider  ?  Answer:   Penni Homans A [id number]  ? olmesartan-hydrochlorothiazide (BENICAR HCT) 40-25 MG tablet  ?  Sig: Take 1 tablet by mouth daily.  ?  Dispense:  30 tablet  ?  Refill:  2  ?  Order Specific Question:   Supervising Provider  ?  Answer:   Penni Homans A [id number]  ? ?

## 2021-05-06 ENCOUNTER — Ambulatory Visit (INDEPENDENT_AMBULATORY_CARE_PROVIDER_SITE_OTHER): Payer: No Typology Code available for payment source | Admitting: Family

## 2021-05-06 DIAGNOSIS — I10 Essential (primary) hypertension: Secondary | ICD-10-CM

## 2021-05-06 NOTE — Assessment & Plan Note (Signed)
Stable. Has lost 6 pounds since her last visit. Continue mounjaro 5mg  once weekly.  ?

## 2021-05-06 NOTE — Assessment & Plan Note (Addendum)
BP Readings from Last 3 Encounters:  ?05/06/21 (!) 134/91  ?04/16/21 (!) 154/98  ?04/02/21 (!) 152/95  ? ?Wt Readings from Last 3 Encounters:  ?05/06/21 (!) 306 lb (138.8 kg)  ?04/16/21 (!) 312 lb (141.5 kg)  ?04/02/21 (!) 307 lb (139.3 kg)  ? ?BP is improving. She will continue benicar hct and amlodipine at current doses. She will check bp once daily for 1 week and send me her readings via mychart. If DBP >90 will increase amlodipine dose.  ?

## 2021-05-06 NOTE — Patient Instructions (Signed)
Please complete lab work prior to leaving.  ?Check blood pressure once daily for 1 week and then send me your readings via mychart.  ?

## 2021-05-06 NOTE — Progress Notes (Signed)
? ?Subjective:  ? ?By signing my name below, I, Cassell ClementAmber Collins, attest that this documentation has been prepared under the direction and in the presence of Sandford CrazeO'Sullivan, Detrick Dani NP, 05/06/2021 ? ? Patient ID: Cristina Adkins, female    DOB: 10/30/1980, 41 y.o.   MRN: [id number] ? ?Chief Complaint  ?Patient presents with  ? Hypertension  ?  Here for follow up  ? Morbid obesity  ?  Here for follow up  ? ? ?HPI ?Patient is in today for an office visit ? ? Blood Pressure - Her blood pressure is decreasing. She is currently taking 40 MG of Benicar HCT and 5 MG of Amlodipine. She is satisfied with her medications and takes them in the morning.  She has blood pressure monitor at home.  ?BP Readings from Last 3 Encounters:  ?05/06/21 (!) 134/91  ?04/16/21 (!) 154/98  ?04/02/21 (!) 152/95  ? ?Weight - She has been taking 5 MG of Mounjaro. She is currently satisfied with her medication. She is having good appetite suppression on this dose and no GI side effects.  ?Wt Readings from Last 3 Encounters:  ?05/06/21 (!) 306 lb (138.8 kg)  ?04/16/21 (!) 312 lb (141.5 kg)  ?04/02/21 (!) 307 lb (139.3 kg)  ? ? ? ?Health Maintenance Due  ?Topic Date Due  ? COVID-19 Vaccine (1) Never done  ? Hepatitis C Screening  Never done  ? TETANUS/TDAP  Never done  ? ? ?Past Medical History:  ?Diagnosis Date  ? History of alcohol abuse   ? Hypertension   ? Hypokalemia   ? Iron deficiency   ? ? ?No past surgical history on file. ? ?Family History  ?Problem Relation Age of Onset  ? Hypertension Mother   ? Prostate cancer Father   ? Hypertension Brother   ? Hypertension Maternal Grandmother   ? Diabetes Daughter   ? Lung cancer Daughter   ? Heart disease Maternal Uncle   ? Diabetes Maternal Uncle   ? Colon cancer Maternal Uncle   ? ? ?Social History  ? ?Socioeconomic History  ? Marital status: Single  ?  Spouse name: Not on file  ? Number of children: 1  ? Years of education: Not on file  ? Highest education level: Not on file  ?Occupational History  ?  Occupation: Advice workerClaim representative  ?Tobacco Use  ? Smoking status: Former  ?  Packs/day: 1.00  ?  Years: 9.00  ?  Pack years: 9.00  ?  Types: Cigarettes  ?  Quit date: 11/21/2020  ?  Years since quitting: 0.4  ? Smokeless tobacco: Never  ?Vaping Use  ? Vaping Use: Some days  ? Substances: Nicotine  ?Substance and Sexual Activity  ? Alcohol use: No  ? Drug use: No  ? Sexual activity: Yes  ?  Partners: Male  ?  Birth control/protection: Abstinence  ?Other Topics Concern  ? Not on file  ?Social History Narrative  ? Grandmother is irish/indian  ? 2 half-sisters  ? 2 sisters (full)  ? 1/2 brother  ? Older brother same mom- brother has CP  ? 3 1/2 younger brothers after me  ? 1 daughter   ? Boyfriend  ? Works as a Futures traderclaims analyst processor/adjuster  ? Completed associate degree in business management  ? No pets  ? Enjoys crafting  ? ?Social Determinants of Health  ? ?Financial Resource Strain: Not on file  ?Food Insecurity: Not on file  ?Transportation Needs: Not on file  ?Physical Activity: Not on  file  ?Stress: Not on file  ?Social Connections: Not on file  ?Intimate Partner Violence: Not on file  ? ? ?Outpatient Medications Prior to Visit  ?Medication Sig Dispense Refill  ? amLODipine (NORVASC) 5 MG tablet Take 1 tablet (5 mg total) by mouth daily. 90 tablet 1  ? olmesartan-hydrochlorothiazide (BENICAR HCT) 40-25 MG tablet Take 1 tablet by mouth daily. 30 tablet 2  ? tirzepatide (MOUNJARO) 5 MG/0.5ML Pen Inject 5 mg into the skin once a week. 2 mL 0  ? ?No facility-administered medications prior to visit.  ? ? ?Allergies  ?Allergen Reactions  ? Latex   ?  Irritated and burning skin  ? Other   ?  Allergic to all fruit and raw tomatoes reaction face swells  ? ? ?ROS ? ?  See HPI ?Objective:  ?  ?Physical Exam ?Constitutional:   ?   General: She is not in acute distress. ?   Appearance: Normal appearance. She is not ill-appearing.  ?HENT:  ?   Head: Normocephalic and atraumatic.  ?   Right Ear: External ear normal.  ?    Left Ear: External ear normal.  ?Eyes:  ?   Extraocular Movements: Extraocular movements intact.  ?   Pupils: Pupils are equal, round, and reactive to light.  ?Cardiovascular:  ?   Rate and Rhythm: Normal rate and regular rhythm.  ?   Heart sounds: Normal heart sounds. No murmur heard. ?  No gallop.  ?Pulmonary:  ?   Effort: Pulmonary effort is normal. No respiratory distress.  ?   Breath sounds: Normal breath sounds. No wheezing or rales.  ?Skin: ?   General: Skin is warm and dry.  ?Neurological:  ?   Mental Status: She is alert and oriented to person, place, and time.  ?Psychiatric:     ?   Mood and Affect: Mood normal.     ?   Behavior: Behavior normal.     ?   Judgment: Judgment normal.  ? ? ?BP (!) 134/91 (BP Location: Right Arm, Patient Position: Sitting, Cuff Size: Large)   Pulse 83   Temp 98.8 ?F (37.1 ?C) (Oral)   Resp 16   Wt (!) 306 lb (138.8 kg)   SpO2 98%   BMI 57.82 kg/m?  ?Wt Readings from Last 3 Encounters:  ?05/06/21 (!) 306 lb (138.8 kg)  ?04/16/21 (!) 312 lb (141.5 kg)  ?04/02/21 (!) 307 lb (139.3 kg)  ? ? ?   ?Assessment & Plan:  ? ?Problem List Items Addressed This Visit   ? ?  ? Unprioritized  ? Morbid obesity ([facility])  ?  Stable. Has lost 6 pounds since her last visit. Continue mounjaro 5mg  once weekly.  ?  ?  ? Hypertension  ?  BP Readings from Last 3 Encounters:  ?05/06/21 (!) 134/91  ?04/16/21 (!) 154/98  ?04/02/21 (!) 152/95  ? ?Wt Readings from Last 3 Encounters:  ?05/06/21 (!) 306 lb (138.8 kg)  ?04/16/21 (!) 312 lb (141.5 kg)  ?04/02/21 (!) 307 lb (139.3 kg)  ?BP is improving. She will continue benicar hct and amlodipine at current doses. She will check bp once daily for 1 week and send me her readings via mychart. If DBP >90 will increase amlodipine dose.  ?  ?  ? ? ? ? ?No orders of the defined types were placed in this encounter. ? ? ?I, 06/02/21, NP, personally preformed the services described in this documentation.  All medical record entries made by  the scribe were  at my direction and in my presence.  I have reviewed the chart and discharge instructions (if applicable) and agree that the record reflects my personal performance and is accurate and complete. 05/06/2021 ? ? ?I,Amber Collins,acting as a Neurosurgeon for [employer], NP.,have documented all relevant documentation on the behalf of Lemont Fillers, NP,as directed by  Lemont Fillers, NP while in the presence of Lemont Fillers, NP. ? ? ? ?Lemont Fillers, NP ? ?

## 2021-05-16 ENCOUNTER — Encounter: Payer: Self-pay | Admitting: Family

## 2021-05-16 MED ORDER — AMLODIPINE BESYLATE 5 MG PO TABS: 7.5000 mg | ORAL_TABLET | Freq: Every day | ORAL | 1 refills | Status: DC

## 2021-05-21 ENCOUNTER — Encounter: Payer: No Typology Code available for payment source | Attending: Family | Admitting: Registered"

## 2021-05-21 ENCOUNTER — Encounter: Payer: Self-pay | Admitting: Registered"

## 2021-05-21 DIAGNOSIS — Z713 Dietary counseling and surveillance: Secondary | ICD-10-CM | POA: Insufficient documentation

## 2021-05-21 NOTE — Patient Instructions (Addendum)
When house hunting, make the priority to have a peaceful place to sleep ?Look for a way to make your screens black without turning off computers ?Mindfulness based stress reduction free course ?https://palousemindfulness.com/ ?Consider working on getting better sleep ?https://www.sleepfoundation.org/ ?Continue with your plan to find a peaceful spot with water ? ?Consider having the fruit smoothies you have prepared in your freezer. Instead of juice try water or other liquid that doesn't add more carbohydrates (water is an option) Vegetables smoothies sometimes too. ? ?Keep a regular sleep schedule between 11 pm and 1 am; start with bedtime routine. Avoid caffeine after 6 pm. ?

## 2021-05-21 NOTE — Progress Notes (Signed)
Medical Nutrition Therapy  ?Appointment Start time:  [id number]  Appointment End time:  1215 ? ?Primary concerns today: weight management  ?Referral diagnosis: e66.01 ?Preferred learning style: no preference indicated ?Learning readiness: contemplating, ready ? ? ?NUTRITION ASSESSMENT  ? ?Anthropometrics  ?Not assessed per patient preference  ? ?Clinical ?Medical Hx: HTN, insomnia, hyperglycemia, anemia, (pt also reports acid reflux) ?Medications: Mounjaro (~6 weeks) ?Labs: A1c 5.9% ?Notable Signs/Symptoms: weight gain/stable past few years ? ?Lifestyle & Dietary Hx ?Pt states she would like to know what she should be doing to lose weight. Pt states she is not a stress eater; usually loses appetite when stressed. Pt states she doesn't drink soda or sweet tea and has stopped energy drinks ? ?Pt states she gets minimal sleep, has a CPAP but sometimes has issues when wearing it. Pt states her bedroom is not restful because she also uses as her work space and computers have to stay on all night for updates. Pt states she is in the process of looking at houses.  ? ?Pt states some of her health markers such as cholesterol are fine per MD. Pt states one of her providers at [facility] said she is going into starvation mode because she goes long periods without eating. ? ?Pt reports she tried keto last year, noticed a difference in energy, main issue was finding variety of foods to eat. Pt states 2018-2019 tried vegetarian diet as an experiement but ended up in hospital due to high sodium diet and vit D deficiency, anemia, and other deficiencies. ? ?Pt states a typical day might follow the typical schedule: ? 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 Often can go 8 hrs without eating.  ? ?Yesterday after work (3 pm) hungry chicken and rice (2 spoonfuls of rice). Feel asleep on couch. 9 pm shower, tried to eat, but no appetite, in bed at 11 pm tried to go back  to sleep, on phone until 1 am forced herself to go back to sleep because had to get up early for work. ? ?Coffee, dogs out, 8 am; grilled cheese sand ~12, coffee, 2 meetings ?2 another cup of coffee; 4pm spinach, grilled chkn breast. ? ?Mindfulness at work T & Th; hasn't been participating during training ? ?Estimated daily fluid intake: 4 -16 oz bottles ?Supplements: none ?Sleep: not enough. ?Stress / self-care: 8-9/10 ?Current average weekly physical activity: doesn't sit still ? ?cleaning, errands ? ?24-Hr Dietary Recall ?First Meal: coffee ?Snack:  ?Second Meal: grilled cheese, coffee ?Snack: coffee ?Third Meal: spinach with grilled chicken breast ?Snack:  ?Beverages: water, coffee ? ?NUTRITION DIAGNOSIS  ?NB-1.1 Food and nutrition-related knowledge deficit As related to how much sleep/stress affect hormones and weight and dietary intake.  As evidenced by sleep patterns. ? ? ?NUTRITION INTERVENTION  ?Nutrition education (E-1) on the following topics:  ?Stress hormones role in weight gain ?Circadian rhythms; Importance of consistent sleep cycle ?MyPlate ? ?Handouts Provided Include  ?Dish up a Healthy Plate ?Sleep hygiene ?Meal ideas ?Snack sheet ? ?Learning Style & Readiness for Change ?Teaching method utilized: Visual & Auditory  ?Demonstrated degree of understanding via: Teach Back  ?Barriers to learning/adherence to lifestyle change: none ? ?Goals Established by Pt ?When house hunting, make the priority to have a peaceful place to sleep ?Look for a way to make your screens black without turning off computers ?Mindfulness based stress reduction free course ?https://palousemindfulness.com/ ?Consider working  on getting better sleep ?https://www.sleepfoundation.org/ ?Continue with your plan to find a peaceful spot with water ? ?Consider having the fruit smoothies you have prepared in your freezer. Instead of juice try water or other liquid that doesn't add more carbohydrates (water is an option) Vegetables  smoothies sometimes too. ? ?Keep a regular sleep schedule between 11 pm and 1 am; start with bedtime routine. Avoid caffeine after 6 pm. ? ? ?MONITORING & EVALUATION ?Dietary intake, weekly physical activity, and sleep in 6 weeks. ?

## 2021-07-09 ENCOUNTER — Ambulatory Visit: Payer: No Typology Code available for payment source | Admitting: Registered"

## 2021-07-26 ENCOUNTER — Other Ambulatory Visit: Payer: Self-pay | Admitting: Family

## 2021-07-26 DIAGNOSIS — I10 Essential (primary) hypertension: Secondary | ICD-10-CM

## 2021-08-05 ENCOUNTER — Ambulatory Visit: Payer: No Typology Code available for payment source | Admitting: Family

## 2021-08-12 ENCOUNTER — Ambulatory Visit (INDEPENDENT_AMBULATORY_CARE_PROVIDER_SITE_OTHER): Payer: No Typology Code available for payment source | Admitting: Family

## 2021-08-12 VITALS — BP 146/93 | HR 91 | Temp 98.6°F | Resp 16 | Wt 322.0 lb

## 2021-08-12 DIAGNOSIS — R739 Hyperglycemia, unspecified: Secondary | ICD-10-CM

## 2021-08-12 DIAGNOSIS — E611 Iron deficiency: Secondary | ICD-10-CM

## 2021-08-12 DIAGNOSIS — D649 Anemia, unspecified: Secondary | ICD-10-CM

## 2021-08-12 DIAGNOSIS — I10 Essential (primary) hypertension: Secondary | ICD-10-CM

## 2021-08-12 MED ORDER — TIRZEPATIDE 5 MG/0.5ML ~~LOC~~ SOAJ: 5.0000 mg | SUBCUTANEOUS | 0 refills | Status: DC

## 2021-08-12 MED ORDER — AMLODIPINE BESYLATE 10 MG PO TABS: 10.0000 mg | ORAL_TABLET | Freq: Every day | ORAL | 0 refills | Status: DC

## 2021-08-12 NOTE — Progress Notes (Signed)
Subjective:   By signing my name below, I, Cristina Adkins, attest that this documentation has been prepared under the direction and in the presence of Sandford Craze, NP 08/12/2021.   Patient ID: Cristina Adkins, female    DOB: [birth year], 41 y.o.   MRN: [id number]  Chief Complaint  Patient presents with   Hypertension    Her for follow up    HPI Patient is in today for an office visit.  Blood pressure- Her blood pressure is elevated today. She is currently taking Amlodipine 5 mg and Olmesartan-Hydrochlorothiazide 40-25 mg to manage her blood pressure. BP Readings from Last 3 Encounters:  08/12/21 (!) 146/93  05/06/21 (!) 134/91  04/16/21 (!) 154/98   Pulse Readings from Last 3 Encounters:  08/12/21 91  05/06/21 83  04/16/21 77   Weight- Her weight has increased since her last visit. She reports that she does not have an appetite and in unsure as to why she is gaining weight. She took mounjaro 5mg  for 1 month and then stopped. Did not have any weight loss at that time and she did not have any side effects.  Wt Readings from Last 3 Encounters:  08/12/21 (!) 322 lb (146.1 kg)  05/06/21 (!) 306 lb (138.8 kg)  04/16/21 (!) 312 lb (141.5 kg)   Diet- She states that she does not eat much and has reduced her sugar intake.   Exercise- She reports that she walks her dogs.    Past Medical History:  Diagnosis Date   History of alcohol abuse    Hypertension    Hypokalemia    Iron deficiency     No past surgical history on file.  Family History  Problem Relation Age of Onset   Hypertension Mother    Prostate cancer Father    Hypertension Brother    Hypertension Maternal Grandmother    Diabetes Daughter    Lung cancer Daughter    Heart disease Maternal Uncle    Diabetes Maternal Uncle    Colon cancer Maternal Uncle     Social History   Socioeconomic History   Marital status: Single    Spouse name: Not on file   Number of children: 1   Years of education: Not  on file   Highest education level: Not on file  Occupational History   Occupation: 04/18/21  Tobacco Use   Smoking status: Former    Packs/day: 1.00    Years: 9.00    Total pack years: 9.00    Types: Cigarettes    Quit date: 11/21/2020    Years since quitting: 0.7   Smokeless tobacco: Never  Vaping Use   Vaping Use: Some days   Substances: Nicotine  Substance and Sexual Activity   Alcohol use: No   Drug use: No   Sexual activity: Yes    Partners: Male    Birth control/protection: Abstinence  Other Topics Concern   Not on file  Social History Narrative   Grandmother is irish/indian   2 half-sisters   2 sisters (full)   1/2 brother   Older brother same mom- brother has CP   3 1/2 younger brothers after me   1 daughter    Boyfriend   Works as a 11/23/2020   Completed associate degree in Futures trader   No pets   Enjoys Theatre manager   Social Determinants of Clinical cytogeneticist Strain: Not on file  Food Insecurity: Not on file  Transportation Needs: Not on  file  Physical Activity: Not on file  Stress: Not on file  Social Connections: Not on file  Intimate Partner Violence: Not on file    Outpatient Medications Prior to Visit  Medication Sig Dispense Refill   olmesartan-hydrochlorothiazide (BENICAR HCT) 40-25 MG tablet TAKE ONE TABLET BY MOUTH ONE TIME DAILY 90 tablet 0   amLODipine (NORVASC) 5 MG tablet Take 1.5 tablets (7.5 mg total) by mouth daily. 90 tablet 1   tirzepatide (MOUNJARO) 5 MG/0.5ML Pen Inject 5 mg into the skin once a week. (Patient not taking: Reported on 08/12/2021) 2 mL 0   No facility-administered medications prior to visit.    Allergies  Allergen Reactions   Latex     Irritated and burning skin   Other     Allergic to all fruit and raw tomatoes reaction face swells    ROS See HPI    Objective:    Physical Exam Constitutional:      General: She is not in acute distress.    Appearance:  Normal appearance. She is not ill-appearing.  HENT:     Head: Normocephalic and atraumatic.     Right Ear: External ear normal.     Left Ear: External ear normal.  Eyes:     Extraocular Movements: Extraocular movements intact.     Pupils: Pupils are equal, round, and reactive to light.  Cardiovascular:     Rate and Rhythm: Normal rate and regular rhythm.     Pulses: Normal pulses.     Heart sounds: Normal heart sounds. No murmur heard.    No gallop.  Pulmonary:     Effort: No respiratory distress.     Breath sounds: Normal breath sounds. No wheezing or rales.  Skin:    General: Skin is warm and dry.  Neurological:     Mental Status: She is alert and oriented to person, place, and time.  Psychiatric:        Mood and Affect: Mood normal.        Behavior: Behavior normal.        Judgment: Judgment normal.     BP (!) 146/93   Pulse 91   Temp 98.6 F (37 C) (Oral)   Resp 16   Wt (!) 322 lb (146.1 kg)   SpO2 98%   BMI 60.84 kg/m  Wt Readings from Last 3 Encounters:  08/12/21 (!) 322 lb (146.1 kg)  05/06/21 (!) 306 lb (138.8 kg)  04/16/21 (!) 312 lb (141.5 kg)      Assessment & Plan:   Problem List Items Addressed This Visit       Unprioritized   Morbid obesity (HCC)    Restart Mounjaro.        Relevant Medications   tirzepatide [facility]) 5 MG/0.5ML Pen   Iron deficiency   Relevant Orders   CBC with Differential/Platelet   Iron, TIBC and Ferritin Panel   Hypertension    BP Readings from Last 3 Encounters:  08/12/21 (!) 146/93  05/06/21 (!) 134/91  04/16/21 (!) 154/98  BP remains above goal. Will increase amlodipine from 7.5mg  to 10mg  once daily. Continue current dose of benicar hct.       Relevant Medications   amLODipine (NORVASC) 10 MG tablet   Other Relevant Orders   Basic metabolic panel   Hyperglycemia - Primary   Relevant Orders   Hemoglobin A1c   Anemia    Will check CBC/iron studies.        Meds ordered this encounter  Medications    tirzepatide (MOUNJARO) 5 MG/0.5ML Pen    Sig: Inject 5 mg into the skin once a week.    Dispense:  2 mL    Refill:  0    Order Specific Question:   Supervising Provider    Answer:   Danise Edge A [[id number]]   amLODipine (NORVASC) 10 MG tablet    Sig: Take 1 tablet (10 mg total) by mouth daily.    Dispense:  90 tablet    Refill:  0    Order Specific Question:   Supervising Provider    Answer:   Danise Edge A [[id number]]    I, Lemont Fillers, NP, personally preformed the services described in this documentation.  All medical record entries made by the scribe were at my direction and in my presence.  I have reviewed the chart and discharge instructions (if applicable) and agree that the record reflects my personal performance and is accurate and complete. 08/12/2021.  I,Mohammed Iqbal,acting as a Neurosurgeon for [employer], NP.,have documented all relevant documentation on the behalf of Lemont Fillers, NP,as directed by  Lemont Fillers, NP while in the presence of Lemont Fillers, NP.  Lemont Fillers, NP

## 2021-08-12 NOTE — Assessment & Plan Note (Addendum)
BP Readings from Last 3 Encounters:  08/12/21 (!) 146/93  05/06/21 (!) 134/91  04/16/21 (!) 154/98   BP remains above goal. Will increase amlodipine from 7.5mg  to 10mg  once daily. Continue current dose of benicar hct.

## 2021-08-12 NOTE — Assessment & Plan Note (Signed)
Re-start Mounjaro 

## 2021-08-12 NOTE — Assessment & Plan Note (Signed)
Will check CBC/iron studies.

## 2021-08-12 NOTE — Patient Instructions (Addendum)
Please complete lab work prior to leaving.  Restart Mounjaro 5mg .   Increase amlodipine to 10mg .

## 2021-08-13 ENCOUNTER — Encounter: Payer: Self-pay | Admitting: Family

## 2021-08-13 ENCOUNTER — Telehealth: Payer: Self-pay | Admitting: Family

## 2021-08-13 DIAGNOSIS — R300 Dysuria: Secondary | ICD-10-CM

## 2021-08-13 LAB — BASIC METABOLIC PANEL
BUN: 27 mg/dL — ABNORMAL HIGH (ref 6–23)
CO2: 25 mEq/L (ref 19–32)
Calcium: 8.7 mg/dL (ref 8.4–10.5)
Chloride: 103 mEq/L (ref 96–112)
Creatinine, Ser: 0.99 mg/dL (ref 0.40–1.20)
GFR: 71.61 mL/min (ref >60.00)
Glucose, Bld: 82 mg/dL (ref 70–99)
Potassium: 4.1 mEq/L (ref 3.5–5.1)
Sodium: 136 mEq/L (ref 135–145)

## 2021-08-13 LAB — CBC WITH DIFFERENTIAL/PLATELET
Basophils Absolute: 0.1 10*3/uL (ref 0.0–0.1)
Basophils Relative: 0.5 % (ref 0.0–3.0)
Eosinophils Absolute: 0.1 10*3/uL (ref 0.0–0.7)
Eosinophils Relative: 1.2 % (ref 0.0–5.0)
HCT: 35.9 % — ABNORMAL LOW (ref 36.0–46.0)
Hemoglobin: 11.2 g/dL — ABNORMAL LOW (ref 12.0–15.0)
Lymphocytes Relative: 15.2 % (ref 12.0–46.0)
Lymphs Abs: 1.7 10*3/uL (ref 0.7–4.0)
MCHC: 31.3 g/dL (ref 30.0–36.0)
MCV: 76.8 fl — ABNORMAL LOW (ref 78.0–100.0)
Monocytes Absolute: 0.7 10*3/uL (ref 0.1–1.0)
Monocytes Relative: 6.5 % (ref 3.0–12.0)
Neutro Abs: 8.4 10*3/uL — ABNORMAL HIGH (ref 1.4–7.7)
Neutrophils Relative %: 76.6 % (ref 43.0–77.0)
Platelets: 465 10*3/uL — ABNORMAL HIGH (ref 150.0–400.0)
RBC: 4.67 Mil/uL (ref 3.87–5.11)
RDW: 21.3 % — ABNORMAL HIGH (ref 11.5–15.5)
WBC: 11 10*3/uL — ABNORMAL HIGH (ref 4.0–10.5)

## 2021-08-13 LAB — IRON,TIBC AND FERRITIN PANEL
%SAT: 12 % (calc) — ABNORMAL LOW (ref 16–45)
Ferritin: 16 ng/mL (ref 16–154)
Iron: 44 ug/dL (ref 40–190)
TIBC: 356 mcg/dL (calc) (ref 250–450)

## 2021-08-13 LAB — HEMOGLOBIN A1C: Hgb A1c MFr Bld: 6.2 % (ref 4.6–6.5)

## 2021-08-13 MED ORDER — MULTI-VITAMIN/MINERALS PO TABS: 1.0000 | ORAL_TABLET | Freq: Every day | ORAL | Status: AC

## 2021-08-13 NOTE — Telephone Encounter (Signed)
See mychart.  

## 2021-08-14 NOTE — Telephone Encounter (Signed)
Called but no answer and no voice mail came up 

## 2021-08-15 ENCOUNTER — Other Ambulatory Visit (INDEPENDENT_AMBULATORY_CARE_PROVIDER_SITE_OTHER): Payer: No Typology Code available for payment source

## 2021-08-15 DIAGNOSIS — R300 Dysuria: Secondary | ICD-10-CM

## 2021-08-15 LAB — URINALYSIS, ROUTINE W REFLEX MICROSCOPIC
Bilirubin Urine: NEGATIVE
Ketones, ur: NEGATIVE
Leukocytes,Ua: NEGATIVE
Nitrite: NEGATIVE
Specific Gravity, Urine: 1.025 (ref 1.000–1.030)
Total Protein, Urine: 30 — AB
Urine Glucose: NEGATIVE
Urobilinogen, UA: 0.2 (ref 0.0–1.0)
pH: 6 (ref 5.0–8.0)

## 2021-08-16 LAB — URINE CULTURE
MICRO NUMBER:: 13678429
SPECIMEN QUALITY:: ADEQUATE

## 2021-08-18 ENCOUNTER — Telehealth: Payer: Self-pay | Admitting: Family

## 2021-08-18 NOTE — Telephone Encounter (Signed)
Urine culture is negative for infection. There was blood in her urine though- did she have her period that day. Please repeat UA in 1 month, dx microscopic hematuria.

## 2021-08-19 NOTE — Telephone Encounter (Signed)
Called but no answer and no voice mail 

## 2021-08-20 NOTE — Telephone Encounter (Signed)
Patient advised of results and she will follow up for ov and urine check on 08/25/21

## 2021-08-25 ENCOUNTER — Other Ambulatory Visit: Payer: Self-pay

## 2021-08-25 ENCOUNTER — Other Ambulatory Visit (INDEPENDENT_AMBULATORY_CARE_PROVIDER_SITE_OTHER): Payer: No Typology Code available for payment source

## 2021-08-25 DIAGNOSIS — R3129 Other microscopic hematuria: Secondary | ICD-10-CM

## 2021-08-25 LAB — POC URINALSYSI DIPSTICK (AUTOMATED)
Bilirubin, UA: NEGATIVE
Blood, UA: NEGATIVE
Glucose, UA: NEGATIVE
Ketones, UA: NEGATIVE
Leukocytes, UA: NEGATIVE
Nitrite, UA: NEGATIVE
Protein, UA: NEGATIVE
Spec Grav, UA: 1.01 (ref 1.010–1.025)
Urobilinogen, UA: 0.2 E.U./dL
pH, UA: 5 (ref 5.0–8.0)

## 2021-08-31 ENCOUNTER — Encounter: Payer: Self-pay | Admitting: Family

## 2021-09-01 MED ORDER — TIRZEPATIDE 7.5 MG/0.5ML ~~LOC~~ SOAJ: 7.5000 mg | SUBCUTANEOUS | 0 refills | Status: DC

## 2021-09-23 ENCOUNTER — Encounter (HOSPITAL_BASED_OUTPATIENT_CLINIC_OR_DEPARTMENT_OTHER): Payer: Self-pay | Admitting: Emergency Medicine

## 2021-09-23 ENCOUNTER — Other Ambulatory Visit (HOSPITAL_BASED_OUTPATIENT_CLINIC_OR_DEPARTMENT_OTHER): Payer: Self-pay

## 2021-09-23 ENCOUNTER — Emergency Department (HOSPITAL_BASED_OUTPATIENT_CLINIC_OR_DEPARTMENT_OTHER)
Admission: EM | Admit: 2021-09-23 | Discharge: 2021-09-23 | Disposition: A | Discharge status: Home / Self Care | Payer: No Typology Code available for payment source | Attending: Emergency Medicine | Admitting: Emergency Medicine

## 2021-09-23 ENCOUNTER — Other Ambulatory Visit: Payer: Self-pay

## 2021-09-23 DIAGNOSIS — Z79899 Other long term (current) drug therapy: Secondary | ICD-10-CM | POA: Insufficient documentation

## 2021-09-23 DIAGNOSIS — Z91040 Latex allergy status: Secondary | ICD-10-CM | POA: Insufficient documentation

## 2021-09-23 DIAGNOSIS — I10 Essential (primary) hypertension: Secondary | ICD-10-CM | POA: Insufficient documentation

## 2021-09-23 DIAGNOSIS — N898 Other specified noninflammatory disorders of vagina: Secondary | ICD-10-CM | POA: Diagnosis present

## 2021-09-23 DIAGNOSIS — A599 Trichomoniasis, unspecified: Secondary | ICD-10-CM | POA: Diagnosis not present

## 2021-09-23 LAB — URINALYSIS, MICROSCOPIC (REFLEX): WBC, UA: >50 WBC/hpf (ref 0–5)

## 2021-09-23 LAB — URINALYSIS, ROUTINE W REFLEX MICROSCOPIC
Bilirubin Urine: NEGATIVE
Glucose, UA: NEGATIVE mg/dL
Ketones, ur: NEGATIVE mg/dL
Nitrite: NEGATIVE
Protein, ur: 30 mg/dL — AB
Specific Gravity, Urine: 1.02 (ref 1.005–1.030)
pH: 6 (ref 5.0–8.0)

## 2021-09-23 LAB — WET PREP, GENITAL
Clue Cells Wet Prep HPF POC: NONE SEEN
Sperm: NONE SEEN
WBC, Wet Prep HPF POC: >=10 — AB (ref <10)
Yeast Wet Prep HPF POC: NONE SEEN

## 2021-09-23 LAB — PREGNANCY, URINE: Preg Test, Ur: NEGATIVE

## 2021-09-23 MED ORDER — METRONIDAZOLE 500 MG PO TABS
500.0000 mg | ORAL_TABLET | Freq: Two times a day (BID) | ORAL | 0 refills | Status: DC
  Filled 2021-09-23: qty 14, 7d supply, fill #0

## 2021-09-23 NOTE — ED Provider Notes (Signed)
[facility] EMERGENCY DEPARTMENT Provider Note   CSN: [id number] Arrival date & time: 09/23/21  1417     History  Chief Complaint  Patient presents with   Vaginal Irritation    Cristina Adkins is a 41 y.o. female.  41 year old female with past medical history of hypertension presents with complaint of vaginal itching and urinary frequency.  Patient states her symptoms started about a week after her period.  States that she was on her period while having a GI virus and chose to wear tampons instead of pads and is unsure if this contributed.  She denies any new sexual partners or concerns for STDs.  Patient did a telehealth visit on Friday where they said she may have a UTI and sent in antibiotics which have not helped with her vaginal irritation complaints.  She denies itching or vaginal discharge.  No other complaints or concerns.       Home Medications Prior to Admission medications   Medication Sig Start Date End Date Taking? Authorizing Provider  metroNIDAZOLE (FLAGYL) 500 MG tablet Take 1 tablet (500 mg total) by mouth 2 (two) times daily. 09/23/21  Yes Jeannie Fend, PA-C  amLODipine (NORVASC) 10 MG tablet Take 1 tablet (10 mg total) by mouth daily. 08/12/21   Sandford Craze, NP  Multiple Vitamins-Minerals (MULTIVITAMIN WITH MINERALS) tablet Take 1 tablet by mouth daily. 08/13/21   Sandford Craze, NP  olmesartan-hydrochlorothiazide (BENICAR HCT) 40-25 MG tablet TAKE ONE TABLET BY MOUTH ONE TIME DAILY 07/28/21   Sandford Craze, NP  tirzepatide [facility]) 7.5 MG/0.5ML Pen Inject 7.5 mg into the skin once a week. 09/01/21   Sandford Craze, NP      Allergies    Latex and Other    Review of Systems   Review of Systems Negative except as per HPI Physical Exam Updated Vital Signs BP 121/76   Pulse 88   Temp 98.9 F (37.2 C) (Oral)   Resp 20   Ht 5\' 1"  (1.549 m)   Wt (!) 137 kg   LMP 09/09/2021   SpO2 100%   BMI 57.06 kg/m  Physical  Exam Vitals and nursing note reviewed.  Constitutional:      General: She is not in acute distress.    Appearance: She is well-developed. She is not diaphoretic.  HENT:     Head: Normocephalic and atraumatic.  Pulmonary:     Effort: Pulmonary effort is normal.  Neurological:     Mental Status: She is alert and oriented to person, place, and time.  Psychiatric:        Behavior: Behavior normal.     ED Results / Procedures / Treatments   Labs (all labs ordered are listed, but only abnormal results are displayed) Labs Reviewed  WET PREP, GENITAL - Abnormal; Notable for the following components:      Result Value   Trich, Wet Prep PRESENT (*)    WBC, Wet Prep HPF POC >=10 (*)    All other components within normal limits  URINALYSIS, ROUTINE W REFLEX MICROSCOPIC - Abnormal; Notable for the following components:   APPearance CLOUDY (*)    Hgb urine dipstick MODERATE (*)    Protein, ur 30 (*)    Leukocytes,Ua LARGE (*)    All other components within normal limits  URINALYSIS, MICROSCOPIC (REFLEX) - Abnormal; Notable for the following components:   Bacteria, UA MANY (*)    Trichomonas, UA PRESENT (*)    All other components within normal limits  PREGNANCY, URINE  GC/CHLAMYDIA PROBE AMP ([place]) NOT AT [facility]    EKG None  Radiology No results found.  Procedures Procedures    Medications Ordered in ED Medications - No data to display  ED Course/ Medical Decision Making/ A&P                           Medical Decision Making Amount and/or Complexity of Data Reviewed Labs: ordered.  Risk Prescription drug management.   41 year old female with complaint of vaginal irritation ever since using a tampon 1 week ago.  Patient did a telehealth visit, thought she may have a UTI and was given antibiotics without any improvement in her symptoms.  Her urinalysis today does have large leukocytes with many bacteria however the sample is contaminated with trichomoniasis which  is also evident on her wet prep.  Wet prep is negative for yeast and BV.  hCG negative.  Plan is to treat with Flagyl.  Recommend recheck with PCP.  Add on gonorrhea and Chlamydia testing.  Patient to follow-up in her MyChart account for these results in the next 5 Days.        Final Clinical Impression(s) / ED Diagnoses Final diagnoses:  Trichimoniasis    Rx / DC Orders ED Discharge Orders          Ordered    metroNIDAZOLE (FLAGYL) 500 MG tablet  2 times daily        09/23/21 1629              Jeannie Fend, PA-C 09/23/21 1632    Sloan Leiter, DO 09/24/21 [phone]

## 2021-09-23 NOTE — Discharge Instructions (Addendum)
Take Flagyl as prescribed.  Do not drink alcohol while taking this medication.  Recheck with your primary care provider. Follow-up in your MyChart account for your gonorrhea/chlamydia test results which should be available in the next 5 days.

## 2021-09-23 NOTE — ED Triage Notes (Signed)
Pt arrives pov, steady gait, c/o internal  vaginal irritation x 1 week worse upon waking, recent tx for UTI. Denies d/c

## 2021-09-23 NOTE — ED Notes (Signed)
Pt d/c home per MD order. Discharge summary reviewed with pt, pt verbalizes understanding. Ambulatory off unit. No s/s of acute distress noted at discharge.  °

## 2021-09-23 NOTE — ED Notes (Signed)
Pelvic cart at bedside. 

## 2021-09-24 LAB — GC/CHLAMYDIA PROBE AMP (~~LOC~~) NOT AT ARMC
Chlamydia: NEGATIVE
Comment: NEGATIVE
Comment: NORMAL
Neisseria Gonorrhea: NEGATIVE

## 2021-09-25 ENCOUNTER — Other Ambulatory Visit: Payer: Self-pay | Admitting: Family

## 2021-10-06 ENCOUNTER — Other Ambulatory Visit (HOSPITAL_BASED_OUTPATIENT_CLINIC_OR_DEPARTMENT_OTHER): Payer: Self-pay

## 2021-10-06 ENCOUNTER — Other Ambulatory Visit (HOSPITAL_BASED_OUTPATIENT_CLINIC_OR_DEPARTMENT_OTHER): Payer: Self-pay | Admitting: Emergency Medicine

## 2021-10-06 ENCOUNTER — Telehealth: Payer: Self-pay | Admitting: Family

## 2021-10-06 MED ORDER — METRONIDAZOLE 500 MG PO TABS: 500.0000 mg | ORAL_TABLET | Freq: Two times a day (BID) | ORAL | 0 refills | Status: AC

## 2021-10-06 NOTE — Telephone Encounter (Signed)
Pt states she lost rx while she flew down to her [place] and is coming back today and hoping to get a refill on rx she was given at the ED. She stated the ED dr's downstairs are supposed to be sending in another refill but may have trouble as this is supposed to be a non-refillable rx. Advised pt we may have the same issue and she may need an appt with pcp before we are able to send anything else in. She asked a message be sent to see what her options are.

## 2021-10-06 NOTE — Telephone Encounter (Signed)
Medication: metroNIDAZOLE (FLAGYL) 500 MG tablet   Has the patient contacted their pharmacy? Yes.     Preferred Pharmacy:  [facility]   [address], [address], [place] [address]  Phone:  [phone]  Fax:  [phone]

## 2021-11-19 ENCOUNTER — Other Ambulatory Visit: Payer: Self-pay | Admitting: Family

## 2021-11-19 DIAGNOSIS — I10 Essential (primary) hypertension: Secondary | ICD-10-CM

## 2021-11-19 MED ORDER — AMLODIPINE BESYLATE 10 MG PO TABS: 10.0000 mg | ORAL_TABLET | Freq: Every day | ORAL | 0 refills | Status: DC

## 2021-11-19 MED ORDER — OLMESARTAN MEDOXOMIL-HCTZ 40-25 MG PO TABS: 1.0000 | ORAL_TABLET | Freq: Every day | ORAL | 0 refills | Status: DC

## 2021-12-12 ENCOUNTER — Other Ambulatory Visit (HOSPITAL_COMMUNITY)
Admission: RE | Admit: 2021-12-12 | Discharge: 2021-12-12 | Disposition: A | Discharge status: Home / Self Care | Payer: No Typology Code available for payment source | Source: Ambulatory Visit | Attending: Family | Admitting: Family

## 2021-12-12 ENCOUNTER — Ambulatory Visit (INDEPENDENT_AMBULATORY_CARE_PROVIDER_SITE_OTHER): Payer: No Typology Code available for payment source | Admitting: Family

## 2021-12-12 VITALS — BP 124/87 | HR 109 | Temp 98.6°F | Resp 16 | Wt 298.0 lb

## 2021-12-12 DIAGNOSIS — N760 Acute vaginitis: Secondary | ICD-10-CM | POA: Insufficient documentation

## 2021-12-12 DIAGNOSIS — I10 Essential (primary) hypertension: Secondary | ICD-10-CM

## 2021-12-12 MED ORDER — TIRZEPATIDE 5 MG/0.5ML ~~LOC~~ SOAJ: 5.0000 mg | SUBCUTANEOUS | 0 refills | Status: DC

## 2021-12-12 NOTE — Progress Notes (Addendum)
Subjective:     Patient ID: Cristina Adkins, female    DOB: [birth year], 40 y.o.   MRN: [id number]  Chief Complaint  Patient presents with   Vaginal Discharge    Complains of vaginal irritation with some discharge    Vaginal Discharge The patient's primary symptoms include vaginal discharge.    Patient treated for trichimoniasis in August and initial symptoms resolved. Notes + milky discharge noted, + irritation.  Not sexually active since last treatment.  Health Maintenance Due  Topic Date Due   COVID-19 Vaccine (1) Never done   Hepatitis C Screening  Never done   INFLUENZA VACCINE  Never done    Past Medical History:  Diagnosis Date   History of alcohol abuse    Hypertension    Hypokalemia    Iron deficiency     No past surgical history on file.  Family History  Problem Relation Age of Onset   Hypertension Mother    Prostate cancer Father    Hypertension Brother    Hypertension Maternal Grandmother    Diabetes Daughter    Lung cancer Daughter    Heart disease Maternal Uncle    Diabetes Maternal Uncle    Colon cancer Maternal Uncle     Social History   Socioeconomic History   Marital status: Single    Spouse name: Not on file   Number of children: 1   Years of education: Not on file   Highest education level: Not on file  Occupational History   Occupation: Advice worker  Tobacco Use   Smoking status: Former    Packs/day: 1.00    Years: 9.00    Total pack years: 9.00    Types: Cigarettes    Quit date: 11/21/2020    Years since quitting: 1.0   Smokeless tobacco: Never  Vaping Use   Vaping Use: Some days   Substances: Nicotine  Substance and Sexual Activity   Alcohol use: No   Drug use: No   Sexual activity: Yes    Partners: Male    Birth control/protection: Abstinence  Other Topics Concern   Not on file  Social History Narrative   Grandmother is irish/indian   2 half-sisters   2 sisters (full)   1/2 brother   Older brother same  mom- brother has CP   3 1/2 younger brothers after me   1 daughter    Boyfriend   Works as a Futures trader   Completed associate degree in Theatre manager   No pets   Enjoys Clinical cytogeneticist   Social Determinants of Corporate investment banker Strain: Not on file  Food Insecurity: Not on file  Transportation Needs: Not on file  Physical Activity: Not on file  Stress: Not on file  Social Connections: Not on file  Intimate Partner Violence: Not on file    Outpatient Medications Prior to Visit  Medication Sig Dispense Refill   amLODipine (NORVASC) 10 MG tablet Take 1 tablet (10 mg total) by mouth daily. 30 tablet 0   Multiple Vitamins-Minerals (MULTIVITAMIN WITH MINERALS) tablet Take 1 tablet by mouth daily.     olmesartan-hydrochlorothiazide (BENICAR HCT) 40-25 MG tablet Take 1 tablet by mouth daily. 30 tablet 0   tirzepatide (MOUNJARO) 7.5 MG/0.5ML Pen Inject 7.5 mg into the skin once a week. (Patient not taking: Reported on 12/12/2021) 6 mL 0   No facility-administered medications prior to visit.    Allergies  Allergen Reactions   Latex     Irritated  and burning skin   Other     Allergic to all fruit and raw tomatoes reaction face swells    Review of Systems  Genitourinary:  Positive for vaginal discharge.       Objective:    Physical Exam Constitutional:      General: She is not in acute distress.    Appearance: Normal appearance. She is well-developed.  HENT:     Head: Normocephalic and atraumatic.     Right Ear: External ear normal.     Left Ear: External ear normal.  Eyes:     General: No scleral icterus. Neck:     Thyroid: No thyromegaly.  Cardiovascular:     Rate and Rhythm: Normal rate and regular rhythm.     Heart sounds: Normal heart sounds. No murmur heard. Pulmonary:     Effort: Pulmonary effort is normal. No respiratory distress.     Breath sounds: Normal breath sounds. No wheezing.  Genitourinary:    General: Normal vulva.      Exam position: Lithotomy position.     Labia:        Right: No rash.        Left: No rash.      Vagina: Normal.     Cervix: Normal.     Uterus: Normal.      Rectum: Normal.  Musculoskeletal:     Cervical back: Neck supple.  Skin:    General: Skin is warm and dry.  Neurological:     Mental Status: She is alert and oriented to person, place, and time.  Psychiatric:        Mood and Affect: Mood normal.        Behavior: Behavior normal.        Thought Content: Thought content normal.        Judgment: Judgment normal.     BP 124/87 (BP Location: Right Arm, Patient Position: Sitting, Cuff Size: Large)   Pulse (!) 109   Temp 98.6 F (37 C) (Oral)   Resp 16   Wt 298 lb (135.2 kg)   SpO2 97%   BMI 56.31 kg/m  Wt Readings from Last 3 Encounters:  12/12/21 298 lb (135.2 kg)  09/23/21 (!) 302 lb (137 kg)  08/12/21 (!) 322 lb (146.1 kg)       Assessment & Plan:   Problem List Items Addressed This Visit       Unprioritized   Morbid obesity (HCC)    She ran out of mounjaro a few months back.  She would like to restart. Rx sent for 5mg  weekly.        Relevant Medications   tirzepatide [facility]) 5 MG/0.5ML Pen   Hypertension    BP Readings from Last 3 Encounters:  12/12/21 124/87  09/23/21 121/76  08/12/21 (!) 146/93  BP looks great- continue amlodipine and benicar hct.      Acute vaginitis - Primary    Will check for trich, bv, yeast. Recommended topical vagisil prn.  Await results for further recommendations.       Relevant Orders   Cervicovaginal ancillary only( [place])    I have discontinued Cristina Adkins "Cristina Adkins"'s tirzepatide. I am also having her start on tirzepatide. Additionally, I am having her maintain her multivitamin with minerals, olmesartan-hydrochlorothiazide, and amLODipine.  Meds ordered this encounter  Medications   tirzepatide (MOUNJARO) 5 MG/0.5ML Pen    Sig: Inject 5 mg into the skin once a week.    Dispense:  6 mL  Refill:   0    Order Specific Question:   Supervising Provider    Answer:   Danise Edge A [id number]

## 2021-12-12 NOTE — Assessment & Plan Note (Signed)
BP Readings from Last 3 Encounters:  12/12/21 124/87  09/23/21 121/76  08/12/21 (!) 146/93   BP looks great- continue amlodipine and benicar hct.

## 2021-12-12 NOTE — Assessment & Plan Note (Signed)
Will check for trich, bv, yeast. Recommended topical vagisil prn.  Await results for further recommendations.

## 2021-12-12 NOTE — Assessment & Plan Note (Signed)
She ran out of mounjaro a few months back.  She would like to restart. Rx sent for 5mg  weekly.

## 2021-12-15 LAB — CERVICOVAGINAL ANCILLARY ONLY
Bacterial Vaginitis (gardnerella): POSITIVE — AB
Candida Glabrata: NEGATIVE
Candida Vaginitis: NEGATIVE
Comment: NEGATIVE
Comment: NEGATIVE
Comment: NEGATIVE
Comment: NEGATIVE
Trichomonas: POSITIVE — AB

## 2021-12-16 ENCOUNTER — Telehealth: Payer: Self-pay | Admitting: Family

## 2021-12-16 MED ORDER — METRONIDAZOLE 500 MG PO TABS: 500.0000 mg | ORAL_TABLET | Freq: Two times a day (BID) | ORAL | 0 refills | Status: AC

## 2021-12-16 NOTE — Telephone Encounter (Signed)
Swab shows that she still has trichomonas infection. Also has bacterial vaginosis.  Will rx with metronidazole 500mg  bid x 7 days.  We need to use the pills to treat the trich rather than gel.  If she would like to schedule a follow up visit to recheck and make sure it is resolved in 2 weeks I am happy to recheck her. No alcohol while taking the metronidazole.

## 2021-12-16 NOTE — Telephone Encounter (Signed)
Patient advised of results and new prescription. She was scheduled to  come back 1/05 for repeat swab

## 2021-12-30 ENCOUNTER — Ambulatory Visit (INDEPENDENT_AMBULATORY_CARE_PROVIDER_SITE_OTHER): Payer: No Typology Code available for payment source | Admitting: Family

## 2021-12-30 ENCOUNTER — Other Ambulatory Visit (HOSPITAL_COMMUNITY)
Admission: RE | Admit: 2021-12-30 | Discharge: 2021-12-30 | Disposition: A | Discharge status: Home / Self Care | Payer: No Typology Code available for payment source | Source: Ambulatory Visit | Attending: Family | Admitting: Family

## 2021-12-30 VITALS — BP 155/91 | HR 86 | Temp 98.2°F | Resp 16

## 2021-12-30 DIAGNOSIS — N760 Acute vaginitis: Secondary | ICD-10-CM | POA: Diagnosis not present

## 2021-12-30 DIAGNOSIS — I10 Essential (primary) hypertension: Secondary | ICD-10-CM

## 2021-12-30 DIAGNOSIS — B9689 Other specified bacterial agents as the cause of diseases classified elsewhere: Secondary | ICD-10-CM | POA: Insufficient documentation

## 2021-12-30 DIAGNOSIS — A599 Trichomoniasis, unspecified: Secondary | ICD-10-CM

## 2021-12-30 MED ORDER — VALSARTAN-HYDROCHLOROTHIAZIDE 320-25 MG PO TABS: 1.0000 | ORAL_TABLET | Freq: Every day | ORAL | 1 refills | Status: DC

## 2021-12-30 MED ORDER — AMLODIPINE BESYLATE 10 MG PO TABS
10.0000 mg | ORAL_TABLET | Freq: Every day | ORAL | 1 refills | Status: DC
Start: 2021-12-30 — End: 2022-07-09

## 2021-12-31 NOTE — Progress Notes (Signed)
Subjective:     Patient ID: Cristina Adkins, female    DOB: 03/19/1980, 41 y.o.   MRN: [id number]  Chief Complaint  Patient presents with   Vaginitis    Here for follow up repeat swab.     HPI Patient is in today for follow up.  Last visit she presented with vaginal discharge. She had previously take metronidazole for a trichomonas infection.  Swab last visit showed persistent trichomonas and BV. We retreated with metronidazole. Reports resolution of discharge.  Completed the rx. She returns today for retreatment.   Health Maintenance Due  Topic Date Due   COVID-19 Vaccine (1) Never done   Hepatitis C Screening  Never done   DTaP/Tdap/Td (1 - Tdap) Never done   INFLUENZA VACCINE  Never done    Past Medical History:  Diagnosis Date   History of alcohol abuse    Hypertension    Hypokalemia    Iron deficiency     No past surgical history on file.  Family History  Problem Relation Age of Onset   Hypertension Mother    Prostate cancer Father    Hypertension Brother    Hypertension Maternal Grandmother    Diabetes Daughter    Lung cancer Daughter    Heart disease Maternal Uncle    Diabetes Maternal Uncle    Colon cancer Maternal Uncle     Social History   Socioeconomic History   Marital status: Single    Spouse name: Not on file   Number of children: 1   Years of education: Not on file   Highest education level: Not on file  Occupational History   Occupation: Advice worker  Tobacco Use   Smoking status: Former    Packs/day: 1.00    Years: 9.00    Total pack years: 9.00    Types: Cigarettes    Quit date: 11/21/2020    Years since quitting: 1.1   Smokeless tobacco: Never  Vaping Use   Vaping Use: Some days   Substances: Nicotine  Substance and Sexual Activity   Alcohol use: No   Drug use: No   Sexual activity: Yes    Partners: Male    Birth control/protection: Abstinence  Other Topics Concern   Not on file  Social History Narrative    Grandmother is irish/indian   2 half-sisters   2 sisters (full)   1/2 brother   Older brother same mom- brother has CP   3 1/2 younger brothers after me   1 daughter    Boyfriend   Works as a Futures trader   Completed associate degree in Theatre manager   No pets   Enjoys Clinical cytogeneticist   Social Determinants of Corporate investment banker Strain: Not on file  Food Insecurity: Not on file  Transportation Needs: Not on file  Physical Activity: Not on file  Stress: Not on file  Social Connections: Not on file  Intimate Partner Violence: Not on file    Outpatient Medications Prior to Visit  Medication Sig Dispense Refill   Multiple Vitamins-Minerals (MULTIVITAMIN WITH MINERALS) tablet Take 1 tablet by mouth daily.     tirzepatide [facility]) 5 MG/0.5ML Pen Inject 5 mg into the skin once a week. 6 mL 0   amLODipine (NORVASC) 10 MG tablet Take 1 tablet (10 mg total) by mouth daily. 30 tablet 0   olmesartan-hydrochlorothiazide (BENICAR HCT) 40-25 MG tablet Take 1 tablet by mouth daily. 30 tablet 0   No facility-administered medications prior  to visit.    Allergies  Allergen Reactions   Latex     Irritated and burning skin   Other     Allergic to all fruit and raw tomatoes reaction face swells    ROS See HPI    Objective:    Physical Exam Chaperone present: declined chaperone.  Constitutional:      Appearance: Normal appearance.  HENT:     Head: Normocephalic and atraumatic.  Pulmonary:     Effort: Pulmonary effort is normal.  Genitourinary:    Exam position: Lithotomy position.     Pubic Area: No rash.      Labia:        Right: No rash.        Left: No rash.   Neurological:     Mental Status: She is alert.     BP (!) 155/91   Pulse 86   Temp 98.2 F (36.8 C) (Oral)   Resp 16   SpO2 100%  Wt Readings from Last 3 Encounters:  12/12/21 298 lb (135.2 kg)  09/23/21 (!) 302 lb (137 kg)  08/12/21 (!) 322 lb (146.1 kg)       Assessment  & Plan:   Problem List Items Addressed This Visit       Unprioritized   Hypertension    BP is above goal. Will d/c olmesartan/hctz and begin valsartan hctz. Continue amlodipine.       Relevant Medications   valsartan-hydrochlorothiazide (DIOVAN-HCT) 320-25 MG tablet   amLODipine (NORVASC) 10 MG tablet   Other Relevant Orders   Basic metabolic panel   Other Visit Diagnoses     Trichomonas infection    -  Primary   Relevant Orders   Cervicovaginal ancillary only( [place])   Bacterial vaginitis       Relevant Orders   Cervicovaginal ancillary only( [place])       I have discontinued [name]'s olmesartan-hydrochlorothiazide. I am also having her start on valsartan-hydrochlorothiazide. Additionally, I am having her maintain her multivitamin with minerals, tirzepatide, and amLODipine.  Meds ordered this encounter  Medications   valsartan-hydrochlorothiazide (DIOVAN-HCT) 320-25 MG tablet    Sig: Take 1 tablet by mouth daily.    Dispense:  90 tablet    Refill:  1    Order Specific Question:   Supervising Provider    Answer:   Danise Edge A [[id number]]   amLODipine (NORVASC) 10 MG tablet    Sig: Take 1 tablet (10 mg total) by mouth daily.    Dispense:  90 tablet    Refill:  1    Requested drug refills are authorized, however, the patient needs further evaluation and/or laboratory testing before further refills are given. Ask her to make an appointment for this.    Order Specific Question:   Supervising Provider    Answer:   Danise Edge A [[id number]]

## 2021-12-31 NOTE — Assessment & Plan Note (Signed)
BP is above goal. Will d/c olmesartan/hctz and begin valsartan hctz. Continue amlodipine.

## 2022-01-01 LAB — CERVICOVAGINAL ANCILLARY ONLY
Bacterial Vaginitis (gardnerella): NEGATIVE
Comment: NEGATIVE
Comment: NEGATIVE
Trichomonas: POSITIVE — AB

## 2022-01-02 ENCOUNTER — Telehealth: Payer: Self-pay | Admitting: Family

## 2022-01-02 MED ORDER — TINIDAZOLE 500 MG PO TABS: 2.0000 g | ORAL_TABLET | Freq: Once | ORAL | 0 refills | Status: AC

## 2022-01-02 NOTE — Telephone Encounter (Signed)
Noted persistent trichomonas despite treatment x 2 with metronidazole. Will rx with oral tindamax  1.  Reviewed with pt that she probably has drug resistant trich. Recommended that she return in a few weeks for visit to repeat swab for test of cures.  Pt verbalizes understanding.

## 2022-01-12 ENCOUNTER — Ambulatory Visit: Payer: No Typology Code available for payment source | Admitting: Family

## 2022-01-12 ENCOUNTER — Other Ambulatory Visit (HOSPITAL_COMMUNITY)
Admission: RE | Admit: 2022-01-12 | Discharge: 2022-01-12 | Disposition: A | Discharge status: Home / Self Care | Payer: No Typology Code available for payment source | Source: Ambulatory Visit | Attending: Family | Admitting: Family

## 2022-01-12 VITALS — BP 127/79 | HR 103 | Temp 97.8°F | Resp 16 | Ht 61.0 in | Wt 304.0 lb

## 2022-01-12 DIAGNOSIS — Z91018 Allergy to other foods: Secondary | ICD-10-CM | POA: Insufficient documentation

## 2022-01-12 DIAGNOSIS — T781XXA Other adverse food reactions, not elsewhere classified, initial encounter: Secondary | ICD-10-CM | POA: Insufficient documentation

## 2022-01-12 DIAGNOSIS — A5901 Trichomonal vulvovaginitis: Secondary | ICD-10-CM | POA: Diagnosis present

## 2022-01-12 DIAGNOSIS — Z91040 Latex allergy status: Secondary | ICD-10-CM | POA: Diagnosis not present

## 2022-01-12 MED ORDER — EPINEPHRINE 0.3 MG/0.3ML IJ SOAJ: 0.3000 mg | INTRAMUSCULAR | 0 refills | Status: DC | PRN

## 2022-01-12 MED ORDER — TIRZEPATIDE 7.5 MG/0.5ML ~~LOC~~ SOAJ: 7.5000 mg | SUBCUTANEOUS | 0 refills | Status: DC

## 2022-01-12 NOTE — Assessment & Plan Note (Signed)
She avoids all fruits due to allergy. She is not sure which ones she can eat. Recommended follow up with allergist for formal testing. Rx sent for epipen and referral placed.

## 2022-01-12 NOTE — Progress Notes (Signed)
Subjective:   By signing my name below, I, Gilman Buttner, attest that this documentation has been prepared under the direction and in the presence of Sandford Craze, 01/12/2022.     Patient ID: Cristina Adkins, female    DOB: 03/02/1980, 41 y.o.   MRN: [id number]  Chief Complaint  Patient presents with   Hypertension    Here for follow up    HPI Patient is in today for an office visit. Last visit she tested positive for trichomonas despite treatment x 2 with metronidazole and no new partners, no exposure to previously infected partner. We treated her with oral tindamax and she wishes to be retested today.  Noted some white vaginal discharge but no other vaginal complaints.  She also notes some pain in the right middle MIP joint.  She is right handed.   She is interested in help losing weight. Only eats once a day. She is on mounjaro 5mg  and is disappointed in her lack of weight loss.  Wt Readings from Last 3 Encounters:  01/12/22 (!) 304 lb (137.9 kg)  12/12/21 298 lb (135.2 kg)  09/23/21 (!) 302 lb (137 kg)    Health Maintenance Due  Topic Date Due   COVID-19 Vaccine (1) Never done   Hepatitis C Screening  Never done   DTaP/Tdap/Td (1 - Tdap) Never done   INFLUENZA VACCINE  Never done    Past Medical History:  Diagnosis Date   History of alcohol abuse    Hypertension    Hypokalemia    Iron deficiency     No past surgical history on file.  Family History  Problem Relation Age of Onset   Hypertension Mother    Prostate cancer Father    Hypertension Brother    Hypertension Maternal Grandmother    Diabetes Daughter    Lung cancer Daughter    Heart disease Maternal Uncle    Diabetes Maternal Uncle    Colon cancer Maternal Uncle     Social History   Socioeconomic History   Marital status: Single    Spouse name: Not on file   Number of children: 1   Years of education: Not on file   Highest education level: Not on file  Occupational History   Occupation:  09/25/21  Tobacco Use   Smoking status: Former    Packs/day: 1.00    Years: 9.00    Total pack years: 9.00    Types: Cigarettes    Quit date: 11/21/2020    Years since quitting: 1.1   Smokeless tobacco: Never  Vaping Use   Vaping Use: Some days   Substances: Nicotine  Substance and Sexual Activity   Alcohol use: No   Drug use: No   Sexual activity: Yes    Partners: Male    Birth control/protection: Abstinence  Other Topics Concern   Not on file  Social History Narrative   Grandmother is irish/indian   2 half-sisters   2 sisters (full)   1/2 brother   Older brother same mom- brother has CP   3 1/2 younger brothers after me   1 daughter    Boyfriend   Works as a 11/23/2020   Completed associate degree in Futures trader   No pets   Enjoys Theatre manager   Social Determinants of Clinical cytogeneticist Strain: Not on file  Food Insecurity: Not on file  Transportation Needs: Not on file  Physical Activity: Not on file  Stress: Not on file  Social Connections: Not on file  Intimate Partner Violence: Not on file    Outpatient Medications Prior to Visit  Medication Sig Dispense Refill   amLODipine (NORVASC) 10 MG tablet Take 1 tablet (10 mg total) by mouth daily. 90 tablet 1   Multiple Vitamins-Minerals (MULTIVITAMIN WITH MINERALS) tablet Take 1 tablet by mouth daily.     valsartan-hydrochlorothiazide (DIOVAN-HCT) 320-25 MG tablet Take 1 tablet by mouth daily. 90 tablet 1   tirzepatide (MOUNJARO) 5 MG/0.5ML Pen Inject 5 mg into the skin once a week. 6 mL 0   No facility-administered medications prior to visit.    Allergies  Allergen Reactions   Latex     Irritated and burning skin   Other     Allergic to all fruit and raw tomatoes reaction face swells    ROS     Objective:    Physical Exam Constitutional:      General: She is not in acute distress.    Appearance: Normal appearance. She is not ill-appearing.  HENT:      Head: Normocephalic and atraumatic.     Right Ear: External ear normal.     Left Ear: External ear normal.  Eyes:     Extraocular Movements: Extraocular movements intact.     Pupils: Pupils are equal, round, and reactive to light.  Cardiovascular:     Rate and Rhythm: Normal rate and regular rhythm.     Heart sounds: Normal heart sounds. No murmur heard.    No gallop.  Pulmonary:     Effort: Pulmonary effort is normal. No respiratory distress.     Breath sounds: Normal breath sounds. No wheezing or rales.  Genitourinary:    General: Normal vulva.  Skin:    General: Skin is warm and dry.  Neurological:     Mental Status: She is alert and oriented to person, place, and time.  Psychiatric:        Mood and Affect: Mood normal.        Behavior: Behavior normal.        Judgment: Judgment normal.     BP 127/79 (BP Location: Right Arm, Patient Position: Sitting, Cuff Size: Large)   Pulse (!) 103   Temp 97.8 F (36.6 C) (Oral)   Resp 16   Ht 5\' 1"  (1.549 m)   Wt (!) 304 lb (137.9 kg)   SpO2 99%   BMI 57.44 kg/m  Wt Readings from Last 3 Encounters:  01/12/22 (!) 304 lb (137.9 kg)  12/12/21 298 lb (135.2 kg)  09/23/21 (!) 302 lb (137 kg)       Assessment & Plan:   Problem List Items Addressed This Visit       Unprioritized   Trichomonas vaginalis (TV) infection    Pt re-swabbed today.  Hopefully follow up will be negative. Include yeast due to c/o white vaginal discharge.      Relevant Orders   Cervicovaginal ancillary only( [place])   Morbid obesity (HCC)    Increase mounjaro to 7.5mg  weekly. Recommended 3 meals (nonstarchy vegetables and lean protein) and 30 minutes of cardiovascular exercise.       Relevant Medications   tirzepatide (MOUNJARO) 7.5 MG/0.5ML Pen   Latex allergy   Relevant Medications   EPINEPHrine 0.3 mg/0.3 mL IJ SOAJ injection   Other Relevant Orders   Ambulatory referral to Allergy   Food allergy - Primary    She avoids all fruits  due to allergy. She is not sure which ones she  can eat. Recommended follow up with allergist for formal testing. Rx sent for epipen and referral placed.       Relevant Medications   EPINEPHrine 0.3 mg/0.3 mL IJ SOAJ injection   Other Relevant Orders   Ambulatory referral to Allergy   Meds ordered this encounter  Medications   tirzepatide (MOUNJARO) 7.5 MG/0.5ML Pen    Sig: Inject 7.5 mg into the skin once a week.    Dispense:  6 mL    Refill:  0   EPINEPHrine 0.3 mg/0.3 mL IJ SOAJ injection    Sig: Inject 0.3 mg into the muscle as needed for anaphylaxis.    Dispense:  2 each    Refill:  0    I, Sandford Craze, personally preformed the services described in this documentation.  All medical record entries made by the scribe were at my direction and in my presence.  I have reviewed the chart and discharge instructions (if applicable) and agree that the record reflects my personal performance and is accurate and complete. 01/12/2022.   I,Verona Buck,acting as a Neurosurgeon for [employer], NP.,have documented all relevant documentation on the behalf of Lemont Fillers, NP,as directed by  Lemont Fillers, NP while in the presence of Lemont Fillers, NP.    Lemont Fillers, NP

## 2022-01-12 NOTE — Assessment & Plan Note (Signed)
Pt re-swabbed today.  Hopefully follow up will be negative. Include yeast due to c/o white vaginal discharge.

## 2022-01-12 NOTE — Assessment & Plan Note (Signed)
Increase mounjaro to 7.5mg  weekly. Recommended 3 meals (nonstarchy vegetables and lean protein) and 30 minutes of cardiovascular exercise.

## 2022-01-13 LAB — CERVICOVAGINAL ANCILLARY ONLY
Candida Glabrata: NEGATIVE
Candida Vaginitis: NEGATIVE
Comment: NEGATIVE
Comment: NEGATIVE
Comment: NEGATIVE
Trichomonas: NEGATIVE

## 2022-03-04 ENCOUNTER — Encounter: Payer: Self-pay | Admitting: Internal Medicine

## 2022-03-04 ENCOUNTER — Ambulatory Visit (INDEPENDENT_AMBULATORY_CARE_PROVIDER_SITE_OTHER): Payer: No Typology Code available for payment source | Admitting: Internal Medicine

## 2022-03-04 VITALS — BP 154/94 | HR 97 | Temp 98.1°F | Resp 19 | Ht 62.5 in | Wt 322.5 lb

## 2022-03-04 DIAGNOSIS — Z91040 Latex allergy status: Secondary | ICD-10-CM | POA: Diagnosis not present

## 2022-03-04 DIAGNOSIS — J310 Chronic rhinitis: Secondary | ICD-10-CM | POA: Diagnosis not present

## 2022-03-04 DIAGNOSIS — K9049 Malabsorption due to intolerance, not elsewhere classified: Secondary | ICD-10-CM | POA: Diagnosis not present

## 2022-03-04 DIAGNOSIS — T781XXA Other adverse food reactions, not elsewhere classified, initial encounter: Secondary | ICD-10-CM

## 2022-03-04 MED ORDER — ALBUTEROL SULFATE HFA 108 (90 BASE) MCG/ACT IN AERS
2.0000 | INHALATION_SPRAY | Freq: Four times a day (QID) | RESPIRATORY_TRACT | 2 refills | Status: DC | PRN
Start: 2022-03-04 — End: 2022-05-18

## 2022-03-04 NOTE — Progress Notes (Signed)
New Patient Note  RE: Cristina Adkins MRN: [id number] DOB: 09-24-1980 Date of Office Visit: 03/04/2022  Consult requested by: Debbrah Alar, NP Primary care provider: Debbrah Alar, NP  Chief Complaint: Allergic Rhinitis  (Pt have seasonal allergies symptoms. And she's has all fruits allergies.)  History of Present Illness: I had the pleasure of seeing Cristina Adkins for initial evaluation at the [facility] [place] on 03/06/2022. She is a 42 y.o. female, who is referred here by Debbrah Alar, NP for the evaluation of food .  History obtained from patient, chart review.  Concern for Food Allergy:  Food of concern: fruits (banannas, kiwi, apple, blueberry, strawberry, pineapple, banana, grape) tomato, avocado,  History of reaction: throat itching, ear itching.  Tomatos, bananas and pineapples cause lip swelling which she treats with benadryl.  Bannana nut brea causes tongue itching.  Previous allergy testing no Eats egg, dairy, wheat, soy, fish, shellfish, peanuts, tree nuts, sesame without reactions Carries an epinephrine autoinjector: yes Has food allergy action plan no  She also has lactose intolerance.   Chronic rhinitis: started start as a young child  Symptoms include:  dyspnea with grass exposure , nasal congestion, rhinorrhea, post nasal drainage, sneezing, watery eyes, itchy eyes, and itchy nose  Occurs seasonally-springtime  Potential triggers:grass exposure, cats, okay with dog  Treatments tried: benadryl (will make her sleepy) Previous allergy testing: no History of reflux/heartburn: yes: on pantoprazole with partial response History of chronic sinusitis or sinus surgery: no Nonallergic triggers:  denies     She has a history of EIB and had albuterol as an elementary school kid.   She also has a latex allergy which manifested as ear irritation with latex containing ear pieces.  She also had burning sensation with latex condoms    Assessment and Plan: [name] is a 42 y.o. female with: Adverse food reaction, initial encounter - Plan: Allergens w/Total IgE Area 2, Allergen Panel, Food-Berry, Allergen, Kiwi Fruit, f84, Allergen Profile, Food-Citrus, F255-IgE Plum, Allergen Profile, Food-Fruit  Chronic rhinitis  Latex allergy   Plan: Patient Instructions   Oral Allergy Syndrome: suspect pollen food syndrome and/or latex fruit syndrome - Your symptoms are not consistent with true food allergies, and are more likely to be due to oral allergy syndrome. - These symptoms are not life-threatening and are because of a cross reaction between a pollen or latex you are allergic to, and to a protein in specific foods (such as fresh fruits, vegetables, and nuts). - If you can eat these things it is fine to continue to do so.  If not, you may avoid these fresh fruits.  Heating these foods should allow them to be consumed without symptoms. - You may notice increase in symptoms during allergy season, this is to be expected. - Allergy  Immunotherapy can help lessen and some cases cure these symptoms and should be considered if they worsen.  - We will get blood work for some today in follow-up for skin testing to confirm  Chronic Rhinitis Seasonal Allergic: - allergy testing today: deferred, will follow up for environmental testing and select foods   - Prevention:  - allergen avoidance when possible - consider allergy shots as long term control of your symptoms by teaching your immune system to be more tolerant of your allergy triggers  - Symptom control: - Start Nasal Steroid Spray: Best results if used daily. - Options include Flonase (fluticasone), Nasocort (triamcinolone), Nasonex (mometasome) 1- 2 sprays in each nostril daily.  - All  can be purchased over-the-counter if not covered by insurance. - Start Antihistamine: daily or daily as needed.   -Options include Zyrtec (Cetirizine) 14m, Claritin (Loratadine) 163m  Allegra (Fexofenadine) 18035mor Xyzal (Levocetirinze) 5mg52mCan be purchased over-the-counter if not covered by insurance.  Allergic Conjunctivitis:  - Consider Allergy Eye drops-great options include Pataday (Olopatadine) or Zaditor (ketotifen) for eye symptoms daily as needed-both sold over the counter if not covered by insurance. and Rewetting Drops such as Systane,TheraTears, etc  -Avoid eye drops that say red eye relief as they may contain medications that dry out your eyes.    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  Don't hesitate to reach out if you have any additional concerns!  EvelRoney Marion  [facility] [place] [place]   Meds ordered this encounter  Medications   albuterol (VENTOLIN HFA) 108 (90 Base) MCG/ACT inhaler    Sig: Inhale 2 puffs into the lungs every 6 (six) hours as needed for wheezing or shortness of breath.    Dispense:  18 g    Refill:  2   Lab Orders         Allergens w/Total IgE Area 2         Allergen Panel, Food-Berry         Allergen, Kiwi Fruit, f84         Allergen Profile, Food-Citrus         F255-IgE Plum         Allergen Profile, Food-Fruit      Other allergy screening: Asthma: no Rhino conjunctivitis: yes Food allergy: yes Medication allergy:  Latex allergy  Hymenoptera allergy: no Urticaria: no Eczema:no History of recurrent infections suggestive of immunodeficency: no  Diagnostics:  Skin Testing:  Deferred due to [employer] .     Past Medical History: Patient Active Problem List   Diagnosis Date Noted   Trichomonas vaginalis (TV) infection 01/12/2022   Latex allergy 01/12/2022   Food allergy 01/12/2022   Morbid obesity (HCC)[place]/08/2021   Preventative health care 03/05/2021   Hyperglycemia 12/17/2020   Proteinuria 12/17/2020    Anxiety and depression 09/18/2020   Hypertension 09/18/2020   Insomnia 09/18/2020   Iron deficiency    Cardiomegaly 03/29/2017   History of tobacco abuse 03/29/2017   Anemia 03/29/2017   Heavy menstrual bleeding 03/29/2017   Past Medical History:  Diagnosis Date   History of alcohol abuse    Hypertension    Hypokalemia    Iron deficiency    Past Surgical History: History reviewed. No pertinent surgical history. Medication List:  Current Outpatient Medications  Medication Sig Dispense Refill   albuterol (VENTOLIN HFA) 108 (90 Base) MCG/ACT inhaler Inhale 2 puffs into the lungs every 6 (six) hours as needed for wheezing or shortness of breath. 18 g 2   amLODipine (NORVASC) 10 MG tablet Take 1 tablet (10 mg total) by mouth daily. 90 tablet 1   EPINEPHrine 0.3 mg/0.3 mL IJ SOAJ injection Inject 0.3 mg into the muscle as needed for anaphylaxis. 2 each 0   Multiple Vitamins-Minerals (MULTIVITAMIN WITH MINERALS) tablet Take 1 tablet by mouth daily.     tinidazole (TINDAMAX) 500 MG tablet Take 2,000 mg by mouth once.     valsartan-hydrochlorothiazide (DIOVAN-HCT) 320-25 MG tablet Take 1 tablet by mouth  daily. 90 tablet 1   tirzepatide (ZEPBOUND) 7.5 MG/0.5ML Pen Inject 7.5 mg into the skin once a week. 2 mL 3   No current facility-administered medications for this visit.   Allergies: Allergies  Allergen Reactions   Latex     Irritated and burning skin   Other     Allergic to all fruit and raw tomatoes reaction face swells   Social History: Social History   Socioeconomic History   Marital status: Single    Spouse name: Not on file   Number of children: 1   Years of education: Not on file   Highest education level: Not on file  Occupational History   Occupation: Claim representative  Tobacco Use   Smoking status: Former    Packs/day: 1.00    Years: 9.00    Total pack years: 9.00    Types: Cigarettes    Quit date: 11/21/2020    Years since quitting: 1.2   Smokeless  tobacco: Never  Vaping Use   Vaping Use: Some days   Substances: Nicotine  Substance and Sexual Activity   Alcohol use: No   Drug use: No   Sexual activity: Yes    Partners: Male    Birth control/protection: Abstinence  Other Topics Concern   Not on file  Social History Narrative   Grandmother is irish/indian   2 half-sisters   2 sisters (full)   1/2 brother   Older brother same mom- brother has CP   3 1/2 younger brothers after me   1 daughter    Boyfriend   Works as a Psychologist, forensic   Completed associate degree in Proofreader   No pets   Enjoys Barrister's clerk   Social Determinants of Radio broadcast assistant Strain: Not on file  Food Insecurity: Not on file  Transportation Needs: Not on file  Physical Activity: Not on file  Stress: Not on file  Social Connections: Not on file   Lives in a single-family home and is willing to thousand 16.  There are no roaches in the house and bed is 2 feet off the floor.  No dust mite precautions on better pillows.  Is not exposed to fumes, chemicals or dust.  There is no HEPA filter in the home and home is not near an interstate industrial area. Smoking: No exposure Occupation: Adult nurse HistoryFreight forwarder in the house: no Charity fundraiser in the family room: no Carpet in the bedroom: no Heating: electric Cooling: central Pet: yes dogs with access to bedroom, chickens outside the home  Family History: Family History  Problem Relation Age of Onset   Hypertension Mother    Prostate cancer Father    Hypertension Brother    Hypertension Maternal Grandmother    Diabetes Daughter    Lung cancer Daughter    Heart disease Maternal Uncle    Diabetes Maternal Uncle    Colon cancer Maternal Uncle      ROS: All others negative except as noted per HPI.   Objective: BP (!) 154/94 Comment: pt states she didn't take her BP meds today  Pulse 97   Temp 98.1 F (36.7 C) (Temporal)   Resp 19    Ht 5' 2.5" (1.588 m)   Wt (!) 322 lb 8 oz (146.3 kg)   SpO2 98%   BMI 58.05 kg/m  Body mass index is 58.05 kg/m.  General Appearance:  Alert, cooperative, no distress, appears stated age  Head:  Normocephalic, without obvious abnormality,  atraumatic  Eyes:  Conjunctiva clear, EOM's intact  Nose: Nares normal,  erythematous nasal mucosa with clear rhinnorhea, hypertrophic turbinates, no visible anterior polyps, and septum midline  Throat: Lips, tongue normal; teeth and gums normal, + cobblestoning  Neck: Supple, symmetrical  Lungs:   clear to auscultation bilaterally, Respirations unlabored, no coughing  Heart:  regular rate and rhythm and no murmur, Appears well perfused  Extremities: No edema  Skin: Skin color, texture, turgor normal, no rashes or lesions on visualized portions of skin  Neurologic: No gross deficits   The plan was reviewed with the patient/family, and all questions/concerned were addressed.  It was my pleasure to see Zabrina today and participate in her care. Please feel free to contact me with any questions or concerns.  Sincerely,  Roney Marion, MD Allergy & Immunology  [facility] office: [phone] [facility] office: [phone]

## 2022-03-04 NOTE — Patient Instructions (Signed)
  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 - These symptoms are not life-threatening and are because of a cross reaction between a pollen or latex you are allergic to, and to a protein in specific foods (such as fresh fruits, vegetables, and nuts). - If you can eat these things it is fine to continue to do so.  If not, you may avoid these fresh fruits.  Heating these foods should allow them to be consumed without symptoms. - You may notice increase in symptoms during allergy season, this is to be expected. - Allergy  Immunotherapy can help lessen and some cases cure these symptoms and should be considered if they worsen.  - We will get blood work for some today in follow-up for skin testing to confirm  Chronic Rhinitis Seasonal Allergic: - allergy testing today: deferred, will follow up for environmental testing and select foods   - Prevention:  - allergen avoidance when possible - consider allergy shots as long term control of your symptoms by teaching your immune system to be more tolerant of your allergy triggers  - Symptom control: - Start Nasal Steroid Spray: Best results if used daily. - Options include Flonase (fluticasone), Nasocort (triamcinolone), Nasonex (mometasome) 1- 2 sprays in each nostril daily.  - All can be purchased over-the-counter if not covered by insurance. - Start Antihistamine: daily or daily as needed.   -Options include Zyrtec (Cetirizine) 10mg , Claritin (Loratadine) 10mg , Allegra (Fexofenadine) 180mg , or Xyzal (Levocetirinze) 5mg  - Can be purchased over-the-counter if not covered by insurance.  Allergic Conjunctivitis:  - Consider Allergy Eye drops-great options include Pataday (Olopatadine) or Zaditor (ketotifen) for eye symptoms daily as needed-both sold over the counter if not covered by insurance. and  Rewetting Drops such as Systane,TheraTears, etc  -Avoid eye drops that say red eye relief as they may contain medications that dry out your eyes.    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  Don't hesitate to reach out if you have any additional concerns!  Roney Marion, MD  [facility] [place]

## 2022-03-06 ENCOUNTER — Telehealth: Payer: Self-pay

## 2022-03-06 MED ORDER — ZEPBOUND 7.5 MG/0.5ML ~~LOC~~ SOAJ: 7.5000 mg | SUBCUTANEOUS | 3 refills | Status: DC

## 2022-03-06 NOTE — Telephone Encounter (Signed)
See mychart.  

## 2022-03-06 NOTE — Telephone Encounter (Signed)
Insurances no longer covering Mounjaro for obesity. Okay to change to Zepbound 7.37m?

## 2022-03-07 LAB — ALLERGENS W/TOTAL IGE AREA 2
Alternaria Alternata IgE: <0.1 kU/L
Aspergillus Fumigatus IgE: <0.1 kU/L
Bermuda Grass IgE: 0.44 kU/L — AB
Cat Dander IgE: 4.25 kU/L — AB
Cedar, Mountain IgE: 0.6 kU/L — AB
Cladosporium Herbarum IgE: <0.1 kU/L
Cockroach, German IgE: <0.1 kU/L
Common Silver Birch IgE: 0.2 kU/L — AB
Cottonwood IgE: 0.27 kU/L — AB
D Farinae IgE: 0.32 kU/L — AB
D Pteronyssinus IgE: 0.35 kU/L — AB
Dog Dander IgE: 0.59 kU/L — AB
Elm, American IgE: 0.19 kU/L — AB
IgE (Immunoglobulin E), Serum: 41 IU/mL (ref 6–495)
Johnson Grass IgE: <0.1 kU/L
Maple/Box Elder IgE: 0.81 kU/L — AB
Mouse Urine IgE: <0.1 kU/L
Oak, White IgE: 0.32 kU/L — AB
Pecan, Hickory IgE: 0.32 kU/L — AB
Penicillium Chrysogen IgE: <0.1 kU/L
Pigweed, Rough IgE: 0.22 kU/L — AB
Ragweed, Short IgE: 0.27 kU/L — AB
Sheep Sorrel IgE Qn: 0.19 kU/L — AB
Timothy Grass IgE: 0.19 kU/L — AB
White Mulberry IgE: <0.1 kU/L

## 2022-03-07 LAB — ALLERGEN PROFILE, FOOD-CITRUS
Allergen Grapefruit IgE: 0.13 kU/L — AB
Allergen Lime IgE: <0.1 kU/L
Lemon: 0.21 kU/L — AB
Orange: <0.1 kU/L
Tangerine IgE: <0.1 kU/L

## 2022-03-07 LAB — ALLERGEN PANEL, FOOD-BERRY
Allergen Blueberry IgE: <0.1 kU/L
Allergen Strawberry IgE: <0.1 kU/L
F343-IgE Raspberry: <0.1 kU/L

## 2022-03-07 LAB — F255-IGE PLUM: F255-IgE Plum: <0.1 kU/L

## 2022-03-07 LAB — ALLERGEN, KIWI FRUIT, F84: Kiwi Fruit: <0.1 kU/L

## 2022-03-07 LAB — ALLERGEN PROFILE, FOOD-FRUIT
Allergen Apple, IgE: <0.1 kU/L
Allergen Banana IgE: 0.11 kU/L — AB
Allergen Grape IgE: <0.1 kU/L
Allergen Pear IgE: <0.1 kU/L
Allergen, Peach f95: <0.1 kU/L

## 2022-03-10 NOTE — Progress Notes (Signed)
Blood work returned low positive to dust mite, dog, grass, tree pollen, weed pollen.   For foods she was very low positive to banana, grapefruit, lemon.  Other foods were negative.  She should return for confirmatory skin testing.  Can someone let patient know?

## 2022-03-12 NOTE — Progress Notes (Signed)
Blood work returned low positive to dust mite, dog, grass, tree pollen, weed pollen.   For foods she was very low positive to banana, grapefruit, lemon.  Other foods were negative.   She should return for confirmatory skin testing.  Please call the office to schedule.

## 2022-03-13 ENCOUNTER — Ambulatory Visit: Payer: 59 | Admitting: Internal Medicine

## 2022-03-18 NOTE — Progress Notes (Unsigned)
Date of Service/Encounter:  03/19/22  Allergy testing appointment   Initial visit on 03/04/22, seen for adverse food reaction, allergic rhinitis.  Please see that note for additional details.  Today reports for allergy diagnostic testing:    DIAGNOSTICS:  Skin Testing: Environmental allergy panel and select foods. Adequate positive and negative controls Results discussed with patient/family.  Airborne Adult Perc - 03/19/22 1129     Time Antigen Placed 1125    Allergen Manufacturer Lavella Hammock    Location Back    Number of Test 52    1. Control-Buffer 50% Glycerol Negative    2. Control-Histamine 1 mg/ml 3+    3. Albumin saline Negative    4. [place] Negative    5. [place] Omitted    6. [place] 3+    7. [place] Blue 3+    8. Meadow Fescue Negative    9. Perennial Rye 3+    10. Sweet Vernal Negative    11. Timothy 3+    12. Cocklebur Negative    13. Burweed Marshelder Negative    14. Ragweed, short 3+    15. Ragweed, Giant 3+    16. Plantain,  English 4+    17. Lamb's Quarters 3+    18. Sheep Sorrell 2+    19. Rough Pigweed 3+    20. Marsh Elder, Rough 4+    21. Mugwort, Common 3+    22. Ash mix 3+    23. Birch mix Negative    24. Beech American 3+    25. Box, Elder 4+    26. Cedar, red Negative    27. Cottonwood, Eastern 3+    28. Elm mix Negative    29. Hickory 3+    30. Maple mix Omitted    31. Oak, [place] mix 3+    32. Pecan Pollen 4+    33. Pine mix Negative    34. Sycamore Eastern Negative    35. [place], Black Pollen 3+    36. Alternaria alternata Negative    37. Cladosporium Herbarum Negative    38. Aspergillus mix Negative    39. Penicillium mix Negative    40. Bipolaris sorokiniana (Helminthosporium) Negative    41. Drechslera spicifera (Curvularia) Negative    42. Mucor plumbeus Negative    43. Fusarium moniliforme Negative    44. Aureobasidium pullulans (pullulara) Negative    45. Rhizopus oryzae Negative    46. Botrytis cinera Negative    47.  Epicoccum nigrum Negative    48. Phoma betae Negative    49. Candida Albicans Negative    50. Trichophyton mentagrophytes Negative    51. Mite, D Farinae  5,000 AU/ml Negative    52. Mite, D Pteronyssinus  5,000 AU/ml Negative    53. Cat Hair 10,000 BAU/ml Negative    54.  Dog Epithelia Negative    55. Mixed Feathers Negative    56. Horse Epithelia Negative    57. Cockroach, German Negative    58. Mouse Negative    59. Tobacco Leaf Negative             Food Adult Perc - 03/19/22 1100     Time Antigen Placed [place]    Location Back    Number of allergen test 6    42. Tomato Negative    55. Grape (White seedless) Negative    57. Banana Negative    58. Apple Negative    60. Strawberry Negative    63.  Pineapple Negative             Allergy testing results were read and interpreted by myself, documented by clinical staff.  Patient provided with copy of allergy testing along with avoidance measures when indicated.   Patient will follow-up with Dr. Edison Pace in 3 months, sooner if needed.   Sigurd Sos, MD  [facility] [place] [place]

## 2022-03-19 ENCOUNTER — Ambulatory Visit: Payer: No Typology Code available for payment source | Admitting: Internal Medicine

## 2022-03-19 ENCOUNTER — Encounter: Payer: Self-pay | Admitting: Internal Medicine

## 2022-03-19 DIAGNOSIS — J3089 Other allergic rhinitis: Secondary | ICD-10-CM | POA: Diagnosis not present

## 2022-03-19 DIAGNOSIS — T781XXA Other adverse food reactions, not elsewhere classified, initial encounter: Secondary | ICD-10-CM | POA: Diagnosis not present

## 2022-03-19 DIAGNOSIS — T781XXD Other adverse food reactions, not elsewhere classified, subsequent encounter: Secondary | ICD-10-CM

## 2022-03-19 DIAGNOSIS — J302 Other seasonal allergic rhinitis: Secondary | ICD-10-CM

## 2022-03-19 NOTE — Patient Instructions (Signed)
Airborne Adult Perc - 03/19/22 1129     Time Antigen Placed 1125    Allergen Manufacturer Lavella Hammock    Location Back    Number of Test 52    1. Control-Buffer 50% Glycerol Negative    2. Control-Histamine 1 mg/ml 3+    3. Albumin saline Negative    4. [place] Negative    5. [place] Omitted    6. [place] 3+    7. [place] Blue 3+    8. Meadow Fescue Negative    9. Perennial Rye 3+    10. Sweet Vernal Negative    11. Timothy 3+    12. Cocklebur Negative    13. Burweed Marshelder Negative    14. Ragweed, short 3+    15. Ragweed, Giant 3+    16. Plantain,  English 4+    17. Lamb's Quarters 3+    18. Sheep Sorrell 2+    19. Rough Pigweed 3+    20. Marsh Elder, Rough 4+    21. Mugwort, Common 3+    22. Ash mix 3+    23. Birch mix Negative    24. Beech American 3+    25. Box, Elder 4+    26. Cedar, red Negative    27. Cottonwood, Eastern 3+    28. Elm mix Negative    29. Hickory 3+    30. Maple mix Omitted    31. Oak, [place] mix 3+    32. Pecan Pollen 4+    33. Pine mix Negative    34. Sycamore Eastern Negative    35. [place], Black Pollen 3+    36. Alternaria alternata Negative    37. Cladosporium Herbarum Negative    38. Aspergillus mix Negative    39. Penicillium mix Negative    40. Bipolaris sorokiniana (Helminthosporium) Negative    41. Drechslera spicifera (Curvularia) Negative    42. Mucor plumbeus Negative    43. Fusarium moniliforme Negative    44. Aureobasidium pullulans (pullulara) Negative    45. Rhizopus oryzae Negative    46. Botrytis cinera Negative    47. Epicoccum nigrum Negative    48. Phoma betae Negative    49. Candida Albicans Negative    50. Trichophyton mentagrophytes Negative    51. Mite, D Farinae  5,000 AU/ml Negative    52. Mite, D Pteronyssinus  5,000 AU/ml Negative    53. Cat Hair 10,000 BAU/ml Negative    54.  Dog Epithelia Negative    55. Mixed Feathers Negative    56. Horse Epithelia Negative    57. Cockroach, German Negative    58.  Mouse Negative    59. Tobacco Leaf Negative             Food Adult Perc - 03/19/22 1100     Time Antigen Placed Eva    Location Back    Number of allergen test 6    42. Tomato Negative    55. Grape (White seedless) Negative    57. Banana Negative    58. Apple Negative    60. Strawberry Negative    63. Pineapple Negative    Comments lemon neg   grapefruit 7x25

## 2022-05-18 ENCOUNTER — Other Ambulatory Visit: Payer: Self-pay

## 2022-05-18 MED ORDER — ALBUTEROL SULFATE HFA 108 (90 BASE) MCG/ACT IN AERS: 2.0000 | INHALATION_SPRAY | Freq: Four times a day (QID) | RESPIRATORY_TRACT | 2 refills | Status: AC | PRN

## 2022-05-28 ENCOUNTER — Other Ambulatory Visit: Payer: Self-pay | Admitting: Family

## 2022-07-06 ENCOUNTER — Emergency Department (HOSPITAL_BASED_OUTPATIENT_CLINIC_OR_DEPARTMENT_OTHER): Payer: No Typology Code available for payment source

## 2022-07-06 ENCOUNTER — Emergency Department (HOSPITAL_BASED_OUTPATIENT_CLINIC_OR_DEPARTMENT_OTHER)
Admission: EM | Admit: 2022-07-06 | Discharge: 2022-07-06 | Disposition: A | Discharge status: Home / Self Care | Payer: No Typology Code available for payment source | Attending: Emergency Medicine | Admitting: Emergency Medicine

## 2022-07-06 ENCOUNTER — Encounter (HOSPITAL_BASED_OUTPATIENT_CLINIC_OR_DEPARTMENT_OTHER): Payer: Self-pay | Admitting: Emergency Medicine

## 2022-07-06 ENCOUNTER — Other Ambulatory Visit: Payer: Self-pay

## 2022-07-06 ENCOUNTER — Other Ambulatory Visit (HOSPITAL_BASED_OUTPATIENT_CLINIC_OR_DEPARTMENT_OTHER): Payer: Self-pay

## 2022-07-06 DIAGNOSIS — N12 Tubulo-interstitial nephritis, not specified as acute or chronic: Secondary | ICD-10-CM | POA: Diagnosis not present

## 2022-07-06 DIAGNOSIS — Z91040 Latex allergy status: Secondary | ICD-10-CM | POA: Insufficient documentation

## 2022-07-06 DIAGNOSIS — Z79899 Other long term (current) drug therapy: Secondary | ICD-10-CM | POA: Insufficient documentation

## 2022-07-06 DIAGNOSIS — R1031 Right lower quadrant pain: Secondary | ICD-10-CM | POA: Diagnosis present

## 2022-07-06 DIAGNOSIS — I10 Essential (primary) hypertension: Secondary | ICD-10-CM | POA: Diagnosis not present

## 2022-07-06 DIAGNOSIS — R109 Unspecified abdominal pain: Secondary | ICD-10-CM

## 2022-07-06 LAB — URINALYSIS, W/ REFLEX TO CULTURE (INFECTION SUSPECTED)
Bilirubin Urine: NEGATIVE
Glucose, UA: NEGATIVE mg/dL
Ketones, ur: NEGATIVE mg/dL
Nitrite: NEGATIVE
Protein, ur: 30 mg/dL — AB
RBC / HPF: NONE SEEN RBC/hpf (ref 0–5)
Specific Gravity, Urine: >=1.03 (ref 1.005–1.030)
WBC, UA: >50 WBC/hpf (ref 0–5)
pH: 6 (ref 5.0–8.0)

## 2022-07-06 LAB — CBC WITH DIFFERENTIAL/PLATELET
Abs Immature Granulocytes: 0.08 10*3/uL — ABNORMAL HIGH (ref 0.00–0.07)
Basophils Absolute: 0.1 10*3/uL (ref 0.0–0.1)
Basophils Relative: 1 %
Eosinophils Absolute: 0.1 10*3/uL (ref 0.0–0.5)
Eosinophils Relative: 1 %
HCT: 34.5 % — ABNORMAL LOW (ref 36.0–46.0)
Hemoglobin: 10.3 g/dL — ABNORMAL LOW (ref 12.0–15.0)
Immature Granulocytes: 1 %
Lymphocytes Relative: 11 %
Lymphs Abs: 1.5 10*3/uL (ref 0.7–4.0)
MCH: 21.9 pg — ABNORMAL LOW (ref 26.0–34.0)
MCHC: 29.9 g/dL — ABNORMAL LOW (ref 30.0–36.0)
MCV: 73.2 fL — ABNORMAL LOW (ref 80.0–100.0)
Monocytes Absolute: 1 10*3/uL (ref 0.1–1.0)
Monocytes Relative: 7 %
Neutro Abs: 11.4 10*3/uL — ABNORMAL HIGH (ref 1.7–7.7)
Neutrophils Relative %: 79 %
Platelets: 535 10*3/uL — ABNORMAL HIGH (ref 150–400)
RBC: 4.71 MIL/uL (ref 3.87–5.11)
RDW: 23.5 % — ABNORMAL HIGH (ref 11.5–15.5)
Smear Review: INCREASED
WBC: 14.2 10*3/uL — ABNORMAL HIGH (ref 4.0–10.5)
nRBC: 0 % (ref 0.0–0.2)

## 2022-07-06 LAB — COMPREHENSIVE METABOLIC PANEL
ALT: 21 U/L (ref 0–44)
AST: 16 U/L (ref 15–41)
Albumin: 3.2 g/dL — ABNORMAL LOW (ref 3.5–5.0)
Alkaline Phosphatase: 68 U/L (ref 38–126)
Anion gap: 11 (ref 5–15)
BUN: 13 mg/dL (ref 6–20)
CO2: 24 mmol/L (ref 22–32)
Calcium: 8.6 mg/dL — ABNORMAL LOW (ref 8.9–10.3)
Chloride: 101 mmol/L (ref 98–111)
Creatinine, Ser: 0.91 mg/dL (ref 0.44–1.00)
GFR, Estimated: >60 mL/min (ref >60)
Glucose, Bld: 134 mg/dL — ABNORMAL HIGH (ref 70–99)
Potassium: 4.2 mmol/L (ref 3.5–5.1)
Sodium: 136 mmol/L (ref 135–145)
Total Bilirubin: 0.3 mg/dL (ref 0.3–1.2)
Total Protein: 7.8 g/dL (ref 6.5–8.1)

## 2022-07-06 LAB — LIPASE, BLOOD: Lipase: 31 U/L (ref 11–51)

## 2022-07-06 LAB — PREGNANCY, URINE: Preg Test, Ur: NEGATIVE

## 2022-07-06 MED ORDER — HYDROMORPHONE HCL 1 MG/ML IJ SOLN
1.0000 mg | Freq: Once | INTRAMUSCULAR | Status: AC
  Administered 2022-07-06: 1 mg via INTRAVENOUS
  Filled 2022-07-06: qty 1

## 2022-07-06 MED ORDER — ONDANSETRON HCL 4 MG/2ML IJ SOLN
4.0000 mg | Freq: Once | INTRAMUSCULAR | Status: AC
  Administered 2022-07-06: 4 mg via INTRAVENOUS
  Filled 2022-07-06: qty 2

## 2022-07-06 MED ORDER — MORPHINE SULFATE (PF) 4 MG/ML IV SOLN
4.0000 mg | Freq: Once | INTRAVENOUS | Status: AC
  Administered 2022-07-06: 4 mg via INTRAVENOUS
  Filled 2022-07-06: qty 1

## 2022-07-06 MED ORDER — OXYCODONE HCL 5 MG PO TABS
5.0000 mg | ORAL_TABLET | ORAL | 0 refills | Status: DC | PRN
  Filled 2022-07-06: qty 10, 2d supply, fill #0

## 2022-07-06 MED ORDER — CEFDINIR 300 MG PO CAPS
300.0000 mg | ORAL_CAPSULE | Freq: Two times a day (BID) | ORAL | 0 refills | Status: DC
  Filled 2022-07-06: qty 20, 10d supply, fill #0

## 2022-07-06 MED ORDER — LACTATED RINGERS IV BOLUS
1000.0000 mL | Freq: Once | INTRAVENOUS | Status: AC
  Administered 2022-07-06: 1000 mL via INTRAVENOUS

## 2022-07-06 MED ORDER — ONDANSETRON 4 MG PO TBDP
4.0000 mg | ORAL_TABLET | Freq: Three times a day (TID) | ORAL | 0 refills | Status: DC | PRN
  Filled 2022-07-06: qty 20, 7d supply, fill #0

## 2022-07-06 MED ORDER — IOHEXOL 300 MG/ML  SOLN
100.0000 mL | Freq: Once | INTRAMUSCULAR | Status: AC | PRN
  Administered 2022-07-06: 125 mL via INTRAVENOUS

## 2022-07-06 NOTE — ED Notes (Addendum)
Pt complains of pain when sitting, and has chosen to stand up in the room. Pt advised to sit down before morphine administration due to risk of fall.

## 2022-07-06 NOTE — ED Triage Notes (Signed)
Pt reports constipation and tightness to upper abd1 week ago, took laxatives to try to relieve it. Since Friday reports RLQ pain that has worsened and become more constant over time. Denies n/v/d. Reports clay-like stools that are hard to pass. Denies urinary sx or vaginal dc. Denies fever. Pt ambulatory to room with frequent stops. States it hurts to sit down, declined wheelchair for this reason. Denies hx of ovarian cysts. EDP at bedside on arrival. She states pain is worse when attempting a bowel movement. Recent menstrual cycle.

## 2022-07-06 NOTE — ED Provider Notes (Signed)
EMERGENCY [facility]PARTMENT AT [facility] Provider Note   CSN: [id number] Arrival date & time: 07/06/22  0865     History {Add pertinent medical, surgical, social history, OB history to HPI:1} No chief complaint on file.   Cristina Adkins is a 42 y.o. female.  HPI     41 year old female with history of hypertension presents with concern for right sided abdominal pain.  Reports she began to have some constipation and took mirlax last week, then had abdominal cramping at top of abdomen she attributed to the laxative. On Friday, began to have more sharp right sided abdominal pain.  Initially was waxing and waning but now has become constant. Sharp. Does not feels he can get comfortable. Severe pain. Does radiate to back. Denies at this time nausea, vomiting, diarrhea, constipation, fever, chest pain, dyspnea, vaginal discharge or bleeding. Off cycle 12 days ago.  Feels pressure when she urinates and passed something brown looking.  Does have some relief after having bowel movement.  No prior abdominal surgeries.   Past Medical History:  Diagnosis Date  . History of alcohol abuse   . Hypertension   . Hypokalemia   . Iron deficiency      Home Medications Prior to Admission medications   Medication Sig Start Date End Date Taking? Authorizing Provider  albuterol (VENTOLIN HFA) 108 (90 Base) MCG/ACT inhaler Inhale 2 puffs into the lungs every 6 (six) hours as needed for wheezing or shortness of breath. 05/18/22   Ferol Luz, MD  amLODipine (NORVASC) 10 MG tablet Take 1 tablet (10 mg total) by mouth daily. 12/30/21   Sandford Craze, NP  EPINEPHrine 0.3 mg/0.3 mL IJ SOAJ injection Inject 0.3 mg into the muscle as needed for anaphylaxis. 01/12/22   Sandford Craze, NP  Multiple Vitamins-Minerals (MULTIVITAMIN WITH MINERALS) tablet Take 1 tablet by mouth daily. 08/13/21   Sandford Craze, NP  tinidazole (TINDAMAX) 500 MG tablet Take 2,000 mg by mouth once.  01/02/22   [provider]  tirzepatide (ZEPBOUND) 7.5 MG/0.5ML Pen Inject 7.5 mg into the skin once a week. 03/06/22   Sandford Craze, NP  valsartan-hydrochlorothiazide (DIOVAN-HCT) 320-25 MG tablet Take 1 tablet by mouth daily. 12/30/21   Sandford Craze, NP      Allergies    Latex and Other    Review of Systems   Review of Systems  Physical Exam Updated Vital Signs There were no vitals taken for this visit. Physical Exam Vitals and nursing note reviewed.  Constitutional:      General: She is in acute distress (pain).     Appearance: She is well-developed. She is not diaphoretic.  HENT:     Head: Normocephalic and atraumatic.  Eyes:     Conjunctiva/sclera: Conjunctivae normal.  Cardiovascular:     Rate and Rhythm: Normal rate and regular rhythm.  Pulmonary:     Effort: Pulmonary effort is normal. No respiratory distress.  Abdominal:     General: There is no distension.     Palpations: Abdomen is soft.     Tenderness: There is abdominal tenderness. There is guarding (RLQ). Right CVA tenderness: tenderness right lower back extending to right flank.Positive signs include McBurney's sign. Negative signs include Murphy's sign.  Musculoskeletal:        General: No tenderness.     Cervical back: Normal range of motion.  Skin:    General: Skin is warm and dry.     Findings: No erythema or rash.  Neurological:  Mental Status: She is alert and oriented to person, place, and time.    ED Results / Procedures / Treatments   Labs (all labs ordered are listed, but only abnormal results are displayed) Labs Reviewed - No data to display  EKG None  Radiology No results found.  Procedures Procedures  {Document cardiac monitor, telemetry assessment procedure when appropriate:1}  Medications Ordered in ED Medications - No data to display  ED Course/ Medical Decision Making/ A&P   {   Click here for ABCD2, HEART and other calculatorsREFRESH Note before  signing :1}                            42 year old female with history of hypertension presents with concern for right sided abdominal pain.   DDx includes appendicitis, pancreatitis, cholecystitis, colonic obstruction, pyelonephritis, nephrolithiasis, diverticulitis, PID, ovarian torsion, ectopic pregnancy, and tuboovarian abscess.  Labs completed and personally evaluated and interpreted by me show negative pregnancy test, leukocytosis, no transaminitis or pancreatitis. Urinalysis consistent with UTI with many bacteria, greater than 50 WBC.  CT abdomen pelvis ordered showing no nephrolithiasis, hydronephrosis, pyonephric abscess, gallbladder thickening nor appendicitis. Uterus and adnexa appear normal.  Discussed we can perform pelvic exam, however given urinalysis, some urinary symptoms, leukocytosis and CT apperance have low suspicion for PID, TOA, torsoin.    Given rx for cdefdinir   {Document critical care time when appropriate:1} {Document review of labs and clinical decision tools ie heart score, Chads2Vasc2 etc:1}  {Document your independent review of radiology images, and any outside records:1} {Document your discussion with family members, caretakers, and with consultants:1} {Document social determinants of health affecting pt's care:1} {Document your decision making why or why not admission, treatments were needed:1} Final Clinical Impression(s) / ED Diagnoses Final diagnoses:  None    Rx / DC Orders ED Discharge Orders     None

## 2022-07-06 NOTE — Discharge Instructions (Addendum)
For your pain, you may take up to 1000mg of acetaminophen (tylenol) 4 times daily for up to a week. This is the maximum dose of acetminophen (tylenol) you can take from all sources. Please check other over-the-counter medications and prescriptions to ensure you are not taking other medications that contain acetaminophen.  You may also take ibuprofen 400 mg 6 times a day OR 600mg 4 times a day alternating with or at the same time as tylenol.  Take oxycodone as needed for breakthrough pain.  This medication can be addicting, sedating and cause constipation.    

## 2022-07-07 LAB — URINE CULTURE

## 2022-07-09 ENCOUNTER — Other Ambulatory Visit: Payer: Self-pay | Admitting: Family

## 2022-07-10 ENCOUNTER — Inpatient Hospital Stay (HOSPITAL_BASED_OUTPATIENT_CLINIC_OR_DEPARTMENT_OTHER)
Admission: EM | Admit: 2022-07-10 | Discharge: 2022-07-14 | DRG: 392 | Disposition: A | Discharge status: Home / Self Care | Payer: No Typology Code available for payment source | Attending: Internal Medicine | Admitting: Internal Medicine

## 2022-07-10 ENCOUNTER — Emergency Department (HOSPITAL_BASED_OUTPATIENT_CLINIC_OR_DEPARTMENT_OTHER): Payer: No Typology Code available for payment source

## 2022-07-10 ENCOUNTER — Encounter (HOSPITAL_BASED_OUTPATIENT_CLINIC_OR_DEPARTMENT_OTHER): Payer: Self-pay | Admitting: Urology

## 2022-07-10 ENCOUNTER — Other Ambulatory Visit: Payer: Self-pay

## 2022-07-10 DIAGNOSIS — N390 Urinary tract infection, site not specified: Secondary | ICD-10-CM

## 2022-07-10 DIAGNOSIS — N12 Tubulo-interstitial nephritis, not specified as acute or chronic: Secondary | ICD-10-CM | POA: Diagnosis present

## 2022-07-10 DIAGNOSIS — Z6841 Body Mass Index (BMI) 40.0 and over, adult: Secondary | ICD-10-CM | POA: Diagnosis not present

## 2022-07-10 DIAGNOSIS — Z8042 Family history of malignant neoplasm of prostate: Secondary | ICD-10-CM

## 2022-07-10 DIAGNOSIS — K5900 Constipation, unspecified: Principal | ICD-10-CM | POA: Diagnosis present

## 2022-07-10 DIAGNOSIS — Z87891 Personal history of nicotine dependence: Secondary | ICD-10-CM

## 2022-07-10 DIAGNOSIS — Z800 Family history of malignant neoplasm of digestive organs: Secondary | ICD-10-CM

## 2022-07-10 DIAGNOSIS — Z801 Family history of malignant neoplasm of trachea, bronchus and lung: Secondary | ICD-10-CM

## 2022-07-10 DIAGNOSIS — Z8249 Family history of ischemic heart disease and other diseases of the circulatory system: Secondary | ICD-10-CM

## 2022-07-10 DIAGNOSIS — Z79899 Other long term (current) drug therapy: Secondary | ICD-10-CM

## 2022-07-10 DIAGNOSIS — I10 Essential (primary) hypertension: Secondary | ICD-10-CM | POA: Diagnosis present

## 2022-07-10 DIAGNOSIS — Z833 Family history of diabetes mellitus: Secondary | ICD-10-CM | POA: Diagnosis not present

## 2022-07-10 DIAGNOSIS — Z91040 Latex allergy status: Secondary | ICD-10-CM

## 2022-07-10 DIAGNOSIS — E876 Hypokalemia: Secondary | ICD-10-CM | POA: Diagnosis present

## 2022-07-10 DIAGNOSIS — R1031 Right lower quadrant pain: Secondary | ICD-10-CM | POA: Diagnosis not present

## 2022-07-10 DIAGNOSIS — R1084 Generalized abdominal pain: Secondary | ICD-10-CM | POA: Diagnosis present

## 2022-07-10 DIAGNOSIS — Z888 Allergy status to other drugs, medicaments and biological substances status: Secondary | ICD-10-CM

## 2022-07-10 DIAGNOSIS — M545 Low back pain, unspecified: Secondary | ICD-10-CM

## 2022-07-10 DIAGNOSIS — R109 Unspecified abdominal pain: Secondary | ICD-10-CM | POA: Diagnosis present

## 2022-07-10 DIAGNOSIS — Z7985 Long-term (current) use of injectable non-insulin antidiabetic drugs: Secondary | ICD-10-CM

## 2022-07-10 DIAGNOSIS — D509 Iron deficiency anemia, unspecified: Secondary | ICD-10-CM | POA: Diagnosis present

## 2022-07-10 DIAGNOSIS — M549 Dorsalgia, unspecified: Secondary | ICD-10-CM | POA: Diagnosis present

## 2022-07-10 LAB — COMPREHENSIVE METABOLIC PANEL
ALT: 33 U/L (ref 0–44)
AST: 25 U/L (ref 15–41)
Albumin: 2.5 g/dL — ABNORMAL LOW (ref 3.5–5.0)
Alkaline Phosphatase: 86 U/L (ref 38–126)
Anion gap: 8 (ref 5–15)
BUN: 10 mg/dL (ref 6–20)
CO2: 21 mmol/L — ABNORMAL LOW (ref 22–32)
Calcium: 7.3 mg/dL — ABNORMAL LOW (ref 8.9–10.3)
Chloride: 107 mmol/L (ref 98–111)
Creatinine, Ser: 0.82 mg/dL (ref 0.44–1.00)
GFR, Estimated: >60 mL/min (ref >60)
Glucose, Bld: 102 mg/dL — ABNORMAL HIGH (ref 70–99)
Potassium: 3.2 mmol/L — ABNORMAL LOW (ref 3.5–5.1)
Sodium: 136 mmol/L (ref 135–145)
Total Bilirubin: 0.3 mg/dL (ref 0.3–1.2)
Total Protein: 7 g/dL (ref 6.5–8.1)

## 2022-07-10 LAB — URINALYSIS, ROUTINE W REFLEX MICROSCOPIC
Bilirubin Urine: NEGATIVE
Glucose, UA: NEGATIVE mg/dL
Ketones, ur: NEGATIVE mg/dL
Nitrite: NEGATIVE
Protein, ur: 100 mg/dL — AB
Specific Gravity, Urine: 1.015 (ref 1.005–1.030)
pH: 6 (ref 5.0–8.0)

## 2022-07-10 LAB — CBC
HCT: 30.2 % — ABNORMAL LOW (ref 36.0–46.0)
Hemoglobin: 9.2 g/dL — ABNORMAL LOW (ref 12.0–15.0)
MCH: 22.3 pg — ABNORMAL LOW (ref 26.0–34.0)
MCHC: 30.5 g/dL (ref 30.0–36.0)
MCV: 73.3 fL — ABNORMAL LOW (ref 80.0–100.0)
Platelets: 457 10*3/uL — ABNORMAL HIGH (ref 150–400)
RBC: 4.12 MIL/uL (ref 3.87–5.11)
RDW: 23.4 % — ABNORMAL HIGH (ref 11.5–15.5)
WBC: 15.8 10*3/uL — ABNORMAL HIGH (ref 4.0–10.5)
nRBC: 0.1 % (ref 0.0–0.2)

## 2022-07-10 LAB — PREGNANCY, URINE: Preg Test, Ur: NEGATIVE

## 2022-07-10 LAB — LIPASE, BLOOD: Lipase: 27 U/L (ref 11–51)

## 2022-07-10 LAB — URINALYSIS, MICROSCOPIC (REFLEX): RBC / HPF: NONE SEEN RBC/hpf (ref 0–5)

## 2022-07-10 MED ORDER — HEPARIN SODIUM (PORCINE) 5000 UNIT/ML IJ SOLN
5000.0000 [IU] | Freq: Three times a day (TID) | INTRAMUSCULAR | Status: DC
  Filled 2022-07-10: qty 1

## 2022-07-10 MED ORDER — ONDANSETRON HCL 4 MG/2ML IJ SOLN: 4.0000 mg | Freq: Four times a day (QID) | INTRAMUSCULAR | Status: DC | PRN

## 2022-07-10 MED ORDER — SODIUM CHLORIDE 0.9 % IV BOLUS
1000.0000 mL | Freq: Once | INTRAVENOUS | Status: AC
  Administered 2022-07-10: 1000 mL via INTRAVENOUS

## 2022-07-10 MED ORDER — ACETAMINOPHEN 650 MG RE SUPP: 650.0000 mg | Freq: Four times a day (QID) | RECTAL | Status: DC | PRN

## 2022-07-10 MED ORDER — ONDANSETRON HCL 4 MG PO TABS: 4.0000 mg | ORAL_TABLET | Freq: Four times a day (QID) | ORAL | Status: DC | PRN

## 2022-07-10 MED ORDER — HYDROMORPHONE HCL 1 MG/ML IJ SOLN
0.5000 mg | INTRAMUSCULAR | Status: DC | PRN
  Administered 2022-07-10 – 2022-07-12 (×4): 1 mg via INTRAVENOUS
  Filled 2022-07-10 (×4): qty 1

## 2022-07-10 MED ORDER — LEVOFLOXACIN IN D5W 500 MG/100ML IV SOLN
500.0000 mg | INTRAVENOUS | Status: DC
  Administered 2022-07-10: 500 mg via INTRAVENOUS
  Filled 2022-07-10: qty 100

## 2022-07-10 MED ORDER — AMLODIPINE BESYLATE 10 MG PO TABS
10.0000 mg | ORAL_TABLET | Freq: Every day | ORAL | Status: DC
  Administered 2022-07-11 – 2022-07-14 (×4): 10 mg via ORAL
  Filled 2022-07-10 (×4): qty 1

## 2022-07-10 MED ORDER — ACETAMINOPHEN 325 MG PO TABS
650.0000 mg | ORAL_TABLET | Freq: Four times a day (QID) | ORAL | Status: DC | PRN
  Administered 2022-07-11 – 2022-07-14 (×6): 650 mg via ORAL
  Filled 2022-07-10 (×7): qty 2

## 2022-07-10 MED ORDER — CIPROFLOXACIN IN D5W 400 MG/200ML IV SOLN
400.0000 mg | Freq: Once | INTRAVENOUS | Status: AC
  Administered 2022-07-10: 400 mg via INTRAVENOUS
  Filled 2022-07-10: qty 200

## 2022-07-10 MED ORDER — MORPHINE SULFATE (PF) 4 MG/ML IV SOLN
4.0000 mg | Freq: Once | INTRAVENOUS | Status: AC
  Administered 2022-07-10: 4 mg via INTRAVENOUS
  Filled 2022-07-10: qty 1

## 2022-07-10 MED ORDER — OXYCODONE HCL 5 MG PO TABS
5.0000 mg | ORAL_TABLET | ORAL | Status: DC | PRN
  Administered 2022-07-11 – 2022-07-14 (×4): 5 mg via ORAL
  Filled 2022-07-10 (×4): qty 1

## 2022-07-10 MED ORDER — SODIUM CHLORIDE 0.9 % IV SOLN: 1.0000 g | Freq: Once | INTRAVENOUS | Status: DC

## 2022-07-10 MED ORDER — POTASSIUM CHLORIDE CRYS ER 20 MEQ PO TBCR
40.0000 meq | EXTENDED_RELEASE_TABLET | Freq: Once | ORAL | Status: AC
  Administered 2022-07-10: 40 meq via ORAL
  Filled 2022-07-10: qty 2

## 2022-07-10 MED ORDER — ONDANSETRON HCL 4 MG/2ML IJ SOLN
4.0000 mg | Freq: Once | INTRAMUSCULAR | Status: AC
  Administered 2022-07-10: 4 mg via INTRAVENOUS
  Filled 2022-07-10: qty 2

## 2022-07-10 MED ORDER — DOCUSATE SODIUM 100 MG PO CAPS
100.0000 mg | ORAL_CAPSULE | Freq: Two times a day (BID) | ORAL | Status: DC
  Administered 2022-07-10 – 2022-07-14 (×8): 100 mg via ORAL
  Filled 2022-07-10 (×8): qty 1

## 2022-07-10 MED ORDER — POLYETHYLENE GLYCOL 3350 17 G PO PACK
17.0000 g | PACK | Freq: Two times a day (BID) | ORAL | Status: DC
  Administered 2022-07-11 – 2022-07-12 (×3): 17 g via ORAL
  Filled 2022-07-10 (×4): qty 1

## 2022-07-10 MED ORDER — POTASSIUM CHLORIDE IN NACL 20-0.45 MEQ/L-% IV SOLN
INTRAVENOUS | Status: AC
  Filled 2022-07-10 (×3): qty 1000

## 2022-07-10 NOTE — ED Notes (Signed)
Currently waiting on transport from [facility], report called by Victorino Dike, RN prior to shift change, paperwork printed by previous RN. Pt has been updated of status and verbalized understanding. NAD noted, A&O x4.

## 2022-07-10 NOTE — ED Notes (Signed)
Report to [facility], Charity fundraiser at [employer].  Awaiting transport.

## 2022-07-10 NOTE — ED Notes (Signed)
[facility] should arrive in approx 15 mins to transfer pt to [facility], pt and daughter updated. Advised daughter may want to get pt something to eat in route to [facility] for pt as pt is hungry, pt has not ate almost all day, pt grateful. NAD noted, A&O x4.

## 2022-07-10 NOTE — ED Notes (Signed)
[facility] has arrived to transport pt to facility at [facility], NAD noted, A&O x4, VSS. Daughter to follow pt to receiving hospital.

## 2022-07-10 NOTE — ED Triage Notes (Signed)
Pt states continued pain since seen on 6/10 on RLQ and radiating to back  Denies n/v/d, denies fever  States taking cefdinir with no relief and does have oxycodone for pain control  Pain worse with BM but no urinary symptoms

## 2022-07-10 NOTE — ED Provider Notes (Signed)
EMERGENCY DEPARTMENT AT [facility] Provider Note   CSN: [id number] Arrival date & time: 07/10/22  1247     History  Chief Complaint  Patient presents with   Abdominal Pain    Cristina Adkins is a 42 y.o. female with past medical history significant for morbid obesity, cardiomegaly, tobacco abuse, heavy menstrual cycle who presents with concern for ongoing pain since recent emergency department visit on 6/10 and right lower quadrant.  Patient has been taking her cefdinir as prescribed.  She was diagnosed with a urinary tract infection at that time with no other findings to explain her symptoms on CT.  Patient has some oxycodone for pain but she reports that she is taking Tylenol and ibuprofen during the day, oxy at night.    Abdominal Pain      Home Medications Prior to Admission medications   Medication Sig Start Date End Date Taking? Authorizing Provider  albuterol (VENTOLIN HFA) 108 (90 Base) MCG/ACT inhaler Inhale 2 puffs into the lungs every 6 (six) hours as needed for wheezing or shortness of breath. 05/18/22  Yes Ferol Luz, MD  amLODipine (NORVASC) 10 MG tablet Take 1 tablet (10 mg total) by mouth daily. 07/09/22  Yes Sandford Craze, NP  cefdinir (OMNICEF) 300 MG capsule Take 1 capsule (300 mg total) by mouth 2 (two) times daily for 10 days. 07/06/22 07/16/22 Yes Alvira Monday, MD  EPINEPHrine 0.3 mg/0.3 mL IJ SOAJ injection Inject 0.3 mg into the muscle as needed for anaphylaxis. 01/12/22  Yes Sandford Craze, NP  Multiple Vitamins-Minerals (MULTIVITAMIN WITH MINERALS) tablet Take 1 tablet by mouth daily. 08/13/21  Yes Sandford Craze, NP  ondansetron (ZOFRAN-ODT) 4 MG disintegrating tablet Take 1 tablet (4 mg total) by mouth every 8 (eight) hours as needed for nausea or vomiting. 07/06/22  Yes Alvira Monday, MD  oxyCODONE (ROXICODONE) 5 MG immediate release tablet Take 1 tablet (5 mg total) by mouth every 4 (four) hours as needed for  severe pain. 07/06/22  Yes Alvira Monday, MD  tirzepatide (ZEPBOUND) 7.5 MG/0.5ML Pen Inject 7.5 mg into the skin once a week. 03/06/22  Yes Sandford Craze, NP  valsartan-hydrochlorothiazide (DIOVAN-HCT) 320-25 MG tablet Take 1 tablet by mouth daily. 07/09/22  Yes Sandford Craze, NP      Allergies    Latex and Other    Review of Systems   Review of Systems  Gastrointestinal:  Positive for abdominal pain.  All other systems reviewed and are negative.   Physical Exam Updated Vital Signs BP (!) 148/91 (BP Location: Left Arm)   Pulse 90   Temp 97.9 F (36.6 C) (Oral)   Resp 14   Ht 5\' 3"  (1.6 m)   Wt (!) 145.1 kg   LMP 06/15/2022 Comment: neg upreg in er 07/06/2022  SpO2 100%   BMI 56.67 kg/m  Physical Exam Vitals and nursing note reviewed.  Constitutional:      General: She is not in acute distress.    Appearance: Normal appearance.     Comments: Patient seems to be in significant pain, but nontoxic, nonseptic appearing  HENT:     Head: Normocephalic and atraumatic.  Eyes:     General:        Right eye: No discharge.        Left eye: No discharge.  Cardiovascular:     Rate and Rhythm: Normal rate and regular rhythm.     Heart sounds: No murmur heard.    No friction rub. No gallop.  Pulmonary:     Effort: Pulmonary effort is normal.     Breath sounds: Normal breath sounds.  Abdominal:     General: Bowel sounds are normal.     Palpations: Abdomen is soft.     Comments: Focal tenderness to palpation in the right lower quadrant, with radiation towards the right flank.  Some guarding, no rebound, rigidity.  Skin:    General: Skin is warm and dry.     Capillary Refill: Capillary refill takes less than 2 seconds.  Neurological:     Mental Status: She is alert and oriented to person, place, and time.  Psychiatric:        Mood and Affect: Mood normal.        Behavior: Behavior normal.     ED Results / Procedures / Treatments   Labs (all labs ordered are  listed, but only abnormal results are displayed) Labs Reviewed  COMPREHENSIVE METABOLIC PANEL - Abnormal; Notable for the following components:      Result Value   Potassium 3.2 (*)    CO2 21 (*)    Glucose, Bld 102 (*)    Calcium 7.3 (*)    Albumin 2.5 (*)    All other components within normal limits  CBC - Abnormal; Notable for the following components:   WBC 15.8 (*)    Hemoglobin 9.2 (*)    HCT 30.2 (*)    MCV 73.3 (*)    MCH 22.3 (*)    RDW 23.4 (*)    Platelets 457 (*)    All other components within normal limits  URINALYSIS, ROUTINE W REFLEX MICROSCOPIC - Abnormal; Notable for the following components:   Hgb urine dipstick TRACE (*)    Protein, ur 100 (*)    Leukocytes,Ua SMALL (*)    All other components within normal limits  URINALYSIS, MICROSCOPIC (REFLEX) - Abnormal; Notable for the following components:   Bacteria, UA FEW (*)    All other components within normal limits  LIPASE, BLOOD  PREGNANCY, URINE    EKG None  Radiology No results found.  Procedures Procedures    Medications Ordered in ED Medications  ciprofloxacin (CIPRO) IVPB 400 mg (400 mg Intravenous New Bag/Given 07/10/22 1535)  potassium chloride SA (KLOR-CON M) CR tablet 40 mEq (has no administration in time range)  morphine (PF) 4 MG/ML injection 4 mg (4 mg Intravenous Given 07/10/22 1341)  ondansetron (ZOFRAN) injection 4 mg (4 mg Intravenous Given 07/10/22 1341)  sodium chloride 0.9 % bolus 1,000 mL (0 mLs Intravenous Stopped 07/10/22 1535)  morphine (PF) 4 MG/ML injection 4 mg (4 mg Intravenous Given 07/10/22 1530)    ED Course/ Medical Decision Making/ A&P Clinical Course as of 07/10/22 1626  Fri Jul 10, 2022  1514 Abdominal pain not worse or different than 4 days prior. No need for re scan [CP]    Clinical Course User Index [CP] Olene Floss, PA-C                             Medical Decision Making Amount and/or Complexity of Data Reviewed Labs:  ordered.  Risk Prescription drug management.   This patient is a 42 y.o. female who presents to the ED for concern of abdominal pain, this involves an extensive number of treatment options, and is a complaint that carries with it a high risk of complications and morbidity. The emergent differential diagnosis prior to evaluation includes, but is not limited  to,  The causes of generalized abdominal pain include but are not limited to AAA, mesenteric ischemia, appendicitis, diverticulitis, DKA, gastritis, gastroenteritis, AMI, nephrolithiasis, pancreatitis, peritonitis, adrenal insufficiency,lead poisoning, iron toxicity, intestinal ischemia, constipation, UTI,SBO/LBO, splenic rupture, biliary disease, IBD, IBS, PUD, or hepatitis, in context of her recent diagnosis of pyelonephritis considered failure of outpatient antibiotics, ongoing or worsening pyelonephritis. This is not an exhaustive differential.   Past Medical History / Co-morbidities / Social History: morbid obesity, cardiomegaly, tobacco abuse, heavy menstrual cycle  Additional history: Chart reviewed. Pertinent results include: Reviewed lab work, imaging from recent previous emergency department visit, recent diagnosis of pyelonephritis, and plans for discharge  Physical Exam: Physical exam performed. The pertinent findings include: Patient arrives with SIRS vitals, she is tachycardic heart rate of 113, she is afebrile, somewhat hypertensive blood pressure 148/91.  She is morbidly obese, with a focal tenderness in the lower quadrants with radiation towards right flank  Lab Tests: I ordered, and personally interpreted labs.  The pertinent results include: UA still with leukocytes, bacteria, white blood cells, CMP notable for mild hypokalemia potassium 3.2, we will orally replete, CBC with worsening leukocytosis despite beginning antibiotics, white blood cells 15.8 today.  Medications: I ordered medication including fluid bolus for  tachycardia, morphine for pain, Zofran for nausea, Cipro for UTI/pyelonephritis with failure of improvement with cephalosporin.   Consultations Obtained: I requested consultation with the bolus, spoke with Dr. Rito Ehrlich,  and discussed lab and imaging findings as well as pertinent plan - they recommend: admission for pyelonephritis as discussed above   Disposition: After consideration of the diagnostic results and the patients response to treatment, I feel that patient would benefit from  hospital admission.    I discussed this case with my attending physician Dr. Jearld Fenton who cosigned this note including patient's presenting symptoms, physical exam, and planned diagnostics and interventions. Attending physician stated agreement with plan or made changes to plan which were implemented.    Final Clinical Impression(s) / ED Diagnoses Final diagnoses:  Pyelonephritis    Rx / DC Orders ED Discharge Orders     None         Olene Floss, PA-C 07/10/22 1627    Loetta Rough, MD 07/13/22 [id number]

## 2022-07-10 NOTE — H&P (Signed)
[facility] History and Physical  Cristina Adkins ZOX:[id number] DOB: 1980-08-29 DOA: 07/10/2022   PCP: Sandford Craze, NP  Specialists: None  Chief Complaint: Right-sided abdominal pain and back pain for 1 week  HPI: Cristina Adkins is a 42 y.o. female with a past medical history of essential hypertension who was in her usual state of health till past Friday when she started developing pain in the right side of her abdomen.  Radiated to the flank area.  She denied any dysuria nausea vomiting at that time.  She presented to the emergency department on Monday.  She was found to have abnormal UA.  CT scan of the abdomen pelvis did not show any acute findings.  Her symptoms were attributed to a urinary tract infection.  She was discharged home on oral antibiotics.  She presented today back to the emergency department with no relief in her symptoms over the last 4 to 5 days.  She has again pain in the right side of the abdomen also in the back area.  She denies any injuries recently.  No falls.  No recent procedures or surgeries.  No fever per se.  Again denies any dysuria.  She does mention that she has been constipated for the past several days and is unsure if she has been passing any gas.  She is also not certain if the back pain is radiating to the front of the abdominal pain is radiating to the back.  She denies any vaginal bleeding currently.  Did have spotting a few days ago.  Also had vaginal itching a few days ago but none currently.  Is able to ambulate although motion does increase her pain.  In the emergency department she was again found to have elevated WBC and abnormal UA.  Plain film did not show any acute findings.  Urine culture from her last visit grew multiple species.  Due to persistent symptoms she was hospitalized for further evaluation and management.  Home Medications: Prior to Admission medications   Medication Sig Start Date End Date Taking? Authorizing Provider   amLODipine (NORVASC) 10 MG tablet Take 1 tablet (10 mg total) by mouth daily. 07/09/22  Yes Sandford Craze, NP  cefdinir (OMNICEF) 300 MG capsule Take 1 capsule (300 mg total) by mouth 2 (two) times daily for 10 days. 07/06/22 07/16/22 Yes Alvira Monday, MD  EPINEPHrine 0.3 mg/0.3 mL IJ SOAJ injection Inject 0.3 mg into the muscle as needed for anaphylaxis. 01/12/22  Yes Sandford Craze, NP  ondansetron (ZOFRAN-ODT) 4 MG disintegrating tablet Take 1 tablet (4 mg total) by mouth every 8 (eight) hours as needed for nausea or vomiting. 07/06/22  Yes Alvira Monday, MD  oxyCODONE (ROXICODONE) 5 MG immediate release tablet Take 1 tablet (5 mg total) by mouth every 4 (four) hours as needed for severe pain. 07/06/22  Yes Alvira Monday, MD  tirzepatide (ZEPBOUND) 7.5 MG/0.5ML Pen Inject 7.5 mg into the skin once a week. 03/06/22  Yes Sandford Craze, NP  valsartan-hydrochlorothiazide (DIOVAN-HCT) 320-25 MG tablet Take 1 tablet by mouth daily. 07/09/22  Yes Sandford Craze, NP  albuterol (VENTOLIN HFA) 108 (90 Base) MCG/ACT inhaler Inhale 2 puffs into the lungs every 6 (six) hours as needed for wheezing or shortness of breath. 05/18/22   Ferol Luz, MD  Multiple Vitamins-Minerals (MULTIVITAMIN WITH MINERALS) tablet Take 1 tablet by mouth daily. 08/13/21   Sandford Craze, NP    Allergies:  Allergies  Allergen Reactions   Latex Other (See Comments)    Irritated and  burning skin   Other Swelling and Other (See Comments)    Allergic to all fruit and raw tomatoes reaction face swells    Past Medical History: Past Medical History:  Diagnosis Date   History of alcohol abuse    Hypertension    Hypokalemia    Iron deficiency     History reviewed. No pertinent surgical history.  Social History: Lives in [place].  Former smoker.  No current alcohol or illicit drug use.   Family History:  Family History  Problem Relation Age of Onset   Hypertension Mother    Prostate  cancer Father    Hypertension Brother    Hypertension Maternal Grandmother    Diabetes Daughter    Lung cancer Daughter    Heart disease Maternal Uncle    Diabetes Maternal Uncle    Colon cancer Maternal Uncle      Review of Systems - History obtained from the patient General ROS: positive for  - fatigue Psychological ROS: positive for - anxiety Ophthalmic ROS: negative ENT ROS: negative Allergy and Immunology ROS: negative Hematological and Lymphatic ROS: negative Endocrine ROS: negative Respiratory ROS: no cough, shortness of breath, or wheezing Cardiovascular ROS: no chest pain or dyspnea on exertion Gastrointestinal ROS: As in HPI Genito-Urinary ROS: no dysuria, trouble voiding, or hematuria Musculoskeletal ROS: Back pain Neurological ROS: no TIA or stroke symptoms Dermatological ROS: negative  Physical Examination  Vitals:   07/10/22 1952 07/10/22 2000 07/10/22 2006 07/10/22 2101  BP: 136/88 (!) 142/88  (!) 146/89  Pulse: 94 93  95  Resp: 16 20  20   Temp:   98.4 F (36.9 C) 98.3 F (36.8 C)  TempSrc:   Oral Oral  SpO2: 100% 98%  100%  Weight:      Height:        BP (!) 146/89 (BP Location: Left Arm)   Pulse 95   Temp 98.3 F (36.8 C) (Oral)   Resp 20   Ht 5\' 3"  (1.6 m)   Wt (!) 145.1 kg   LMP 06/15/2022 Comment: neg upreg in er 07/06/2022  SpO2 100%   BMI 56.67 kg/m   General appearance: alert, cooperative, appears stated age, no distress, and morbidly obese Head: Normocephalic, without obvious abnormality, atraumatic Eyes: conjunctivae/corneas clear. PERRL, EOM's intact.  Throat: lips, mucosa, and tongue normal; teeth and gums normal Neck: no adenopathy, no carotid bruit, no JVD, supple, symmetrical, trachea midline, and thyroid not enlarged, symmetric, no tenderness/mass/nodules Back: No obvious swelling identified.  She does have a small bump in the lumbar area.  There is tenderness in the midline over the lumbar spine. Resp: clear to auscultation  bilaterally Cardio: regular rate and rhythm, S1, S2 normal, no murmur, click, rub or gallop GI: Her obesity makes it difficult examination but there is tenderness in the right side of the abdomen including the upper quadrant as well as lower quadrant.  There is also pain in the right flank area.  Rectal exam was done.  Good rectal tone noted.  No lesions appreciated exteriorly.  Yellow stool was noted on the finger.  No blood noted. Extremities: extremities normal, atraumatic, no cyanosis or edema Pulses: 2+ and symmetric Skin: Skin color, texture, turgor normal. No rashes or lesions Lymph nodes: Cervical, supraclavicular, and axillary nodes normal. Neurologic: Alert and oriented x 3.  Cranial nerves II to XII intact.  Motor strength equal bilateral upper and lower extremities.    Labs on Admission: I have personally reviewed following labs and imaging studies  CBC: Recent Labs  Lab 07/06/22 0725 07/10/22 1327  WBC 14.2* 15.8*  NEUTROABS 11.4*  --   HGB 10.3* 9.2*  HCT 34.5* 30.2*  MCV 73.2* 73.3*  PLT 535* 457*   Basic Metabolic Panel: Recent Labs  Lab 07/06/22 0725 07/10/22 1327  NA 136 136  K 4.2 3.2*  CL 101 107  CO2 24 21*  GLUCOSE 134* 102*  BUN 13 10  CREATININE 0.91 0.82  CALCIUM 8.6* 7.3*   GFR: Estimated Creatinine Clearance: 128.9 mL/min (by C-G formula based on SCr of 0.82 mg/dL).  Liver Function Tests: Recent Labs  Lab 07/06/22 0725 07/10/22 1327  AST 16 25  ALT 21 33  ALKPHOS 68 86  BILITOT 0.3 0.3  PROT 7.8 7.0  ALBUMIN 3.2* 2.5*   Recent Labs  Lab 07/06/22 0725 07/10/22 1327  LIPASE 31 27      Radiological Exams on Admission: DG Abdomen 1 View  Result Date: 07/10/2022 CLINICAL DATA:  Abdominal pain EXAM: ABDOMEN - 1 VIEW COMPARISON:  CT done on 07/06/2022 FINDINGS: Bowel gas pattern is nonspecific. No abnormal masses or calcifications are seen. Visualized lower lung fields are clear. Bony structures are unremarkable. IMPRESSION:  Nonspecific bowel gas pattern. Electronically Signed   By: Ernie Avena M.D.   On: 07/10/2022 16:47      Problem List  Principal Problem:   UTI (urinary tract infection) Active Problems:   Essential hypertension   Severe obesity (BMI >= 40) (HCC)   Back pain   Abdominal pain   Assessment: This is 42 year old African-American female with past medical history as stated earlier who comes in with abdominal pain and back pain.  She is found to have abnormal UA and leukocytosis.  Etiology for her symptoms not entirely clear.  She had unremarkable CT scan done just a few days ago.  Appendix was noted to be normal.  No abnormality noted in the genitourinary system.  Abdominal x-ray is unremarkable today.  Lipase level and LFTs are normal.  She could have pathology in her lumbar spine.  She also mentions constipation which could be contributing though unlikely to be fully responsible for her symptoms.  Plan:  #1. Abdominal pain, flank pain, back pain: Recent CT scan was negative.  Abdominal x-ray negative today.  LFTs and lipase normal.  Rectal exam unremarkable.  She is tender in her lumbar spine area.  We will proceed with MRI of the lumbar spine to rule out any spinal etiology for her symptoms including infection. Bowel regimen.  #2.  Abnormal UA/leukocytosis/possible UTI: Urine culture from few days ago grew multiple species.  She was on cefdinir at home.  Given Cipro in the ED today.  Will place her on levofloxacin for now.  Follow-up on repeat urine cultures.  #3.  Essential hypertension: Continue with amlodipine.  Hold her ARB and diuretic for now.  #4.  Hypokalemia: Potassium has been repleted.  Recheck labs in the morning along with magnesium level.  #5. Microcytic anemia: She does have a history of iron deficiency previously.  Hemoglobin is slightly lower than her baseline.  No reports of any bleeding episodes.  Did mention vaginal spotting a few days ago.  Pregnancy test was  negative.  Denies any vaginal itching currently.  Will check anemia panel in the morning.   DVT Prophylaxis: Subcutaneous heparin Code Status: Full code Family Communication: Discussed with patient Disposition: Hopefully return home in improved Consults called: None yet Admission Status: Status is: Inpatient Remains inpatient appropriate because: Failure  of outpatient treatment, uncertain diagnosis    Severity of Illness: The appropriate patient status for this patient is INPATIENT. Inpatient status is judged to be reasonable and necessary in order to provide the required intensity of service to ensure the patient's safety. The patient's presenting symptoms, physical exam findings, and initial radiographic and laboratory data in the context of their chronic comorbidities is felt to place them at high risk for further clinical deterioration. Furthermore, it is not anticipated that the patient will be medically stable for discharge from the hospital within 2 midnights of admission.   * I certify that at the point of admission it is my clinical judgment that the patient will require inpatient hospital care spanning beyond 2 midnights from the point of admission due to high intensity of service, high risk for further deterioration and high frequency of surveillance required.*   Further management decisions will depend on results of further testing and patient's response to treatment.   Reginald Mangels [facility]  [facility] [employer] on [email or web address]  07/10/2022, 9:42 PM

## 2022-07-11 ENCOUNTER — Inpatient Hospital Stay (HOSPITAL_COMMUNITY): Payer: No Typology Code available for payment source

## 2022-07-11 DIAGNOSIS — N390 Urinary tract infection, site not specified: Secondary | ICD-10-CM | POA: Diagnosis not present

## 2022-07-11 LAB — COMPREHENSIVE METABOLIC PANEL
ALT: 31 U/L (ref 0–44)
AST: 18 U/L (ref 15–41)
Albumin: 2.6 g/dL — ABNORMAL LOW (ref 3.5–5.0)
Alkaline Phosphatase: 75 U/L (ref 38–126)
Anion gap: 7 (ref 5–15)
BUN: 12 mg/dL (ref 6–20)
CO2: 24 mmol/L (ref 22–32)
Calcium: 7.8 mg/dL — ABNORMAL LOW (ref 8.9–10.3)
Chloride: 103 mmol/L (ref 98–111)
Creatinine, Ser: 0.99 mg/dL (ref 0.44–1.00)
GFR, Estimated: >60 mL/min (ref >60)
Glucose, Bld: 122 mg/dL — ABNORMAL HIGH (ref 70–99)
Potassium: 3.8 mmol/L (ref 3.5–5.1)
Sodium: 134 mmol/L — ABNORMAL LOW (ref 135–145)
Total Bilirubin: 0.6 mg/dL (ref 0.3–1.2)
Total Protein: 7.1 g/dL (ref 6.5–8.1)

## 2022-07-11 LAB — CBC
HCT: 28.7 % — ABNORMAL LOW (ref 36.0–46.0)
Hemoglobin: 8.5 g/dL — ABNORMAL LOW (ref 12.0–15.0)
MCH: 22.2 pg — ABNORMAL LOW (ref 26.0–34.0)
MCHC: 29.6 g/dL — ABNORMAL LOW (ref 30.0–36.0)
MCV: 74.9 fL — ABNORMAL LOW (ref 80.0–100.0)
Platelets: 391 10*3/uL (ref 150–400)
RBC: 3.83 MIL/uL — ABNORMAL LOW (ref 3.87–5.11)
RDW: 22.7 % — ABNORMAL HIGH (ref 11.5–15.5)
WBC: 14.7 10*3/uL — ABNORMAL HIGH (ref 4.0–10.5)
nRBC: 0.1 % (ref 0.0–0.2)

## 2022-07-11 LAB — IRON AND TIBC
Iron: 19 ug/dL — ABNORMAL LOW (ref 28–170)
Saturation Ratios: 7 % — ABNORMAL LOW (ref 10.4–31.8)
TIBC: 277 ug/dL (ref 250–450)
UIBC: 258 ug/dL

## 2022-07-11 LAB — RETICULOCYTES
Immature Retic Fract: 34.9 % — ABNORMAL HIGH (ref 2.3–15.9)
RBC.: 3.83 MIL/uL — ABNORMAL LOW (ref 3.87–5.11)
Retic Count, Absolute: 66.3 10*3/uL (ref 19.0–186.0)
Retic Ct Pct: 1.7 % (ref 0.4–3.1)

## 2022-07-11 LAB — MAGNESIUM: Magnesium: 2 mg/dL (ref 1.7–2.4)

## 2022-07-11 LAB — FERRITIN: Ferritin: 60 ng/mL (ref 11–307)

## 2022-07-11 LAB — HIV ANTIBODY (ROUTINE TESTING W REFLEX): HIV Screen 4th Generation wRfx: NONREACTIVE

## 2022-07-11 LAB — FOLATE: Folate: 7.1 ng/mL (ref >5.9)

## 2022-07-11 LAB — VITAMIN B12: Vitamin B-12: 309 pg/mL (ref 180–914)

## 2022-07-11 MED ORDER — FOLIC ACID 1 MG PO TABS
1.0000 mg | ORAL_TABLET | Freq: Every day | ORAL | Status: DC
  Administered 2022-07-11 – 2022-07-14 (×4): 1 mg via ORAL
  Filled 2022-07-11 (×4): qty 1

## 2022-07-11 MED ORDER — ENOXAPARIN SODIUM 40 MG/0.4ML IJ SOSY
40.0000 mg | PREFILLED_SYRINGE | INTRAMUSCULAR | Status: DC
  Administered 2022-07-11 – 2022-07-12 (×2): 40 mg via SUBCUTANEOUS
  Filled 2022-07-11 (×2): qty 0.4

## 2022-07-11 MED ORDER — CYANOCOBALAMIN 1000 MCG/ML IJ SOLN
1000.0000 ug | Freq: Every day | INTRAMUSCULAR | Status: DC
  Administered 2022-07-11 – 2022-07-14 (×4): 1000 ug via SUBCUTANEOUS
  Filled 2022-07-11 (×4): qty 1

## 2022-07-11 MED ORDER — POLYETHYLENE GLYCOL 3350 17 G PO PACK: 17.0000 g | PACK | Freq: Two times a day (BID) | ORAL | Status: DC

## 2022-07-11 MED ORDER — LEVOFLOXACIN IN D5W 750 MG/150ML IV SOLN
750.0000 mg | INTRAVENOUS | Status: DC
  Administered 2022-07-11 – 2022-07-12 (×2): 750 mg via INTRAVENOUS
  Filled 2022-07-11 (×2): qty 150

## 2022-07-11 MED ORDER — ORAL CARE MOUTH RINSE: 15.0000 mL | OROMUCOSAL | Status: DC | PRN

## 2022-07-11 NOTE — TOC CM/SW Note (Signed)
Transition of Care [facility]) - Inpatient Brief Assessment   Patient Details  Name: Arrion Stary MRN: [id number] Date of Birth: Jun 15, 1980  Transition of Care [facility]) CM/SW Contact:    Otelia Santee, LCSW Phone Number: 07/11/2022, 8:38 AM   Clinical Narrative: Chart reviewed. No TOC needs identified. Please consult TOC should need arise.    Transition of Care Asessment: Insurance and Status: Insurance coverage has been reviewed Patient has primary care physician: Yes Home environment has been reviewed: Home alone Prior level of function:: Independent Prior/Current Home Services: No current home services Social Determinants of Health Reivew: SDOH reviewed no interventions necessary Readmission risk has been reviewed: Yes Transition of care needs: no transition of care needs at this time

## 2022-07-11 NOTE — Progress Notes (Signed)
[facility] PROGRESS NOTE    Progress Note  Cristina Adkins  KVQ:[id number] DOB: 1980-05-23 DOA: 07/10/2022 PCP: Sandford Craze, NP     Brief Narrative:   Cristina Adkins is an 42 y.o. female past medical history of essential hypertension, usually in good health until 2 days prior to admission started having suprapubic pain with flank pain he was found to have urinary tract infection, CT scan of the abdomen pelvis showed no acute findings, was started on antibiotics as an outpatient came back today as her symptoms have worsened   Assessment/Plan:   Abdominal pain flank pain and back pain: MRI of the lumbar spine is pending. Started on a bowel regimen continue analgesics for pain. CT scan of the abdomen pelvis shows a normal appendix no stone unremarkable gallbladder. McBurney sign is positive. Her symptoms do not improve might need a vaginal ultrasound. Continue MiraLAX p.o. twice daily  Abnormal urinalysis/possible UTI (urinary tract infection): Urine culture from a few days ago is unreliable. She she was started on Levaquin repeat a urine cultures have been sent.  Essential hypertension Continue amlodipine hold ARB and diuretic therapy.  Hypokalemia: Repleted orally now improved try to keep above 4.  Microcytic anemia: History of iron deficiency anemia, no signs of overt bleeding.   DVT prophylaxis: lovenox Family Communication:none Status is: Inpatient Remains inpatient appropriate because: Acute abdominal pain    Code Status:     Code Status Orders  (From admission, onward)           Start     Ordered   07/10/22 2139  Full code  Continuous       Question:  By:  Answer:  Consent: discussion documented in EHR   07/10/22 2141           Code Status History     Date Active Date Inactive Code Status Order ID Comments User Context   03/29/2017 1923 03/30/2017 2137 Full Code [id number]  Therisa Doyne, MD Inpatient         IV Access:    Peripheral IV   Procedures and diagnostic studies:   DG Abdomen 1 View  Result Date: 07/10/2022 CLINICAL DATA:  Abdominal pain EXAM: ABDOMEN - 1 VIEW COMPARISON:  CT done on 07/06/2022 FINDINGS: Bowel gas pattern is nonspecific. No abnormal masses or calcifications are seen. Visualized lower lung fields are clear. Bony structures are unremarkable. IMPRESSION: Nonspecific bowel gas pattern. Electronically Signed   By: Ernie Avena M.D.   On: 07/10/2022 16:47     Medical Consultants:   None.   Subjective:    Cristina Adkins  she still having pain.  No appetite  Objective:    Vitals:   07/10/22 2006 07/10/22 2101 07/11/22 0100 07/11/22 0501  BP:  (!) 146/89 (!) 141/85 (!) 132/56  Pulse:  95 89 91  Resp:  20 16 18   Temp: 98.4 F (36.9 C) 98.3 F (36.8 C) 98.2 F (36.8 C) 98.6 F (37 C)  TempSrc: Oral Oral Oral Oral  SpO2:  100% 99% 100%  Weight:      Height:       SpO2: 100 %   Intake/Output Summary (Last 24 hours) at 07/11/2022 0717 Last data filed at 07/10/2022 1641 Gross per 24 hour  Intake 1198.98 ml  Output --  Net 1198.98 ml   Filed Weights   07/10/22 1252  Weight: (!) 145.1 kg    Exam: General exam: In no acute distress. Respiratory system: Good air movement and clear to auscultation. Cardiovascular  system: S1 & S2 heard, RRR. No JVD. Gastrointestinal system: Abdomen is nondistended, soft and epigastric tenderness and right lower quadrant tenderness Extremities: No pedal edema. Skin: No rashes, lesions or ulcers Psychiatry: Judgement and insight appear normal. Mood & affect appropriate.    Data Reviewed:    Labs: Basic Metabolic Panel: Recent Labs  Lab 07/06/22 0725 07/10/22 1327 07/11/22 0423  NA 136 136 134*  K 4.2 3.2* 3.8  CL 101 107 103  CO2 24 21* 24  GLUCOSE 134* 102* 122*  BUN 13 10 12   CREATININE 0.91 0.82 0.99  CALCIUM 8.6* 7.3* 7.8*  MG  --   --  2.0   GFR Estimated Creatinine Clearance: 106.7 mL/min (by C-G  formula based on SCr of 0.99 mg/dL). Liver Function Tests: Recent Labs  Lab 07/06/22 0725 07/10/22 1327 07/11/22 0423  AST 16 25 18   ALT 21 33 31  ALKPHOS 68 86 75  BILITOT 0.3 0.3 0.6  PROT 7.8 7.0 7.1  ALBUMIN 3.2* 2.5* 2.6*   Recent Labs  Lab 07/06/22 0725 07/10/22 1327  LIPASE 31 27   No results for input(s): "AMMONIA" in the last 168 hours. Coagulation profile No results for input(s): "INR", "PROTIME" in the last 168 hours. COVID-19 Labs  Recent Labs    07/11/22 0423  FERRITIN 60    Lab Results  Component Value Date   SARSCOV2NAA POSITIVE (A) 01/08/2021   SARSCOV2NAA Not Detected 10/30/2019   SARSCOV2NAA Not Detected 09/14/2019   SARSCOV2NAA NOT DETECTED 01/02/2019    CBC: Recent Labs  Lab 07/06/22 0725 07/10/22 1327 07/11/22 0423  WBC 14.2* 15.8* 14.7*  NEUTROABS 11.4*  --   --   HGB 10.3* 9.2* 8.5*  HCT 34.5* 30.2* 28.7*  MCV 73.2* 73.3* 74.9*  PLT 535* 457* 391   Cardiac Enzymes: No results for input(s): "CKTOTAL", "CKMB", "CKMBINDEX", "TROPONINI" in the last 168 hours. BNP (last 3 results) No results for input(s): "PROBNP" in the last 8760 hours. CBG: No results for input(s): "GLUCAP" in the last 168 hours. D-Dimer: No results for input(s): "DDIMER" in the last 72 hours. Hgb A1c: No results for input(s): "HGBA1C" in the last 72 hours. Lipid Profile: No results for input(s): "CHOL", "HDL", "LDLCALC", "TRIG", "CHOLHDL", "LDLDIRECT" in the last 72 hours. Thyroid function studies: No results for input(s): "TSH", "T4TOTAL", "T3FREE", "THYROIDAB" in the last 72 hours.  Invalid input(s): "FREET3" Anemia work up: Recent Labs    07/11/22 0423 07/11/22 0424  VITAMINB12 309  --   FOLATE 7.1  --   FERRITIN 60  --   TIBC 277  --   IRON 19*  --   RETICCTPCT  --  1.7   Sepsis Labs: Recent Labs  Lab 07/06/22 0725 07/10/22 1327 07/11/22 0423  WBC 14.2* 15.8* 14.7*   Microbiology Recent Results (from the past 240 hour(s))  Urine Culture      Status: Abnormal   Collection Time: 07/06/22  7:25 AM   Specimen: Urine, Random  Result Value Ref Range Status   Specimen Description   Final    URINE, RANDOM Performed at [facility], [address] [place], [place] [address]    Special Requests   Final    NONE Reflexed from [id number] Performed at [facility], [address] [place], [place] [address]    Culture MULTIPLE SPECIES PRESENT, SUGGEST RECOLLECTION (A)  Final   Report Status 07/07/2022 FINAL  Final     Medications:    amLODipine  10 mg Oral Daily   docusate sodium  100 mg Oral BID   heparin  5,000 Units Subcutaneous Q8H   polyethylene glycol  17 g Oral BID   Continuous Infusions:  0.45 % NaCl with KCl 20 mEq / L 75 mL/hr at 07/10/22 2204   levofloxacin (LEVAQUIN) IV 500 mg (07/10/22 2205)      LOS: 1 day   Marinda Elk  [facility]  07/11/2022, 7:17 AM

## 2022-07-11 NOTE — Plan of Care (Signed)

## 2022-07-11 NOTE — Progress Notes (Signed)
Pt had MRI Lumbar spine w/wo ordered for "back pain, infection suspected." Pt came down for MRI with no IV, stating "they just pulled it out because it was leaking." Pt stated 2 other unseccessful attempts were made to gain IV access on her other arm. MRI exam was completed as without contrast only. Will bring pt back for contrast if radiologist report of without exam states it is needed/necessary. Ordering MD made aware via secure chat.

## 2022-07-12 DIAGNOSIS — N390 Urinary tract infection, site not specified: Secondary | ICD-10-CM | POA: Diagnosis not present

## 2022-07-12 LAB — CBC WITH DIFFERENTIAL/PLATELET
Abs Immature Granulocytes: 0.2 10*3/uL — ABNORMAL HIGH (ref 0.00–0.07)
Basophils Absolute: 0.1 10*3/uL (ref 0.0–0.1)
Basophils Relative: 0 %
Eosinophils Absolute: 0.1 10*3/uL (ref 0.0–0.5)
Eosinophils Relative: 1 %
HCT: 31.5 % — ABNORMAL LOW (ref 36.0–46.0)
Hemoglobin: 9.3 g/dL — ABNORMAL LOW (ref 12.0–15.0)
Immature Granulocytes: 1 %
Lymphocytes Relative: 9 %
Lymphs Abs: 1.3 10*3/uL (ref 0.7–4.0)
MCH: 22 pg — ABNORMAL LOW (ref 26.0–34.0)
MCHC: 29.5 g/dL — ABNORMAL LOW (ref 30.0–36.0)
MCV: 74.6 fL — ABNORMAL LOW (ref 80.0–100.0)
Monocytes Absolute: 1.3 10*3/uL — ABNORMAL HIGH (ref 0.1–1.0)
Monocytes Relative: 9 %
Neutro Abs: 11.2 10*3/uL — ABNORMAL HIGH (ref 1.7–7.7)
Neutrophils Relative %: 80 %
Platelets: 469 10*3/uL — ABNORMAL HIGH (ref 150–400)
RBC: 4.22 MIL/uL (ref 3.87–5.11)
RDW: 23.2 % — ABNORMAL HIGH (ref 11.5–15.5)
WBC: 14.1 10*3/uL — ABNORMAL HIGH (ref 4.0–10.5)
nRBC: 0 % (ref 0.0–0.2)

## 2022-07-12 LAB — URINE CULTURE: Culture: NO GROWTH

## 2022-07-12 MED ORDER — SORBITOL 70 % SOLN
30.0000 mL | Freq: Once | Status: AC
  Administered 2022-07-12: 30 mL via ORAL
  Filled 2022-07-12: qty 30

## 2022-07-12 MED ORDER — SORBITOL 70 % SOLN
960.0000 mL | TOPICAL_OIL | Freq: Once | ORAL | Status: DC
  Filled 2022-07-12: qty 240

## 2022-07-12 MED ORDER — POLYETHYLENE GLYCOL 3350 17 G PO PACK
17.0000 g | PACK | Freq: Two times a day (BID) | ORAL | Status: DC
  Administered 2022-07-12 – 2022-07-14 (×3): 17 g via ORAL
  Filled 2022-07-12 (×3): qty 1

## 2022-07-12 MED ORDER — KETOROLAC TROMETHAMINE 15 MG/ML IJ SOLN
15.0000 mg | Freq: Three times a day (TID) | INTRAMUSCULAR | Status: DC | PRN
  Administered 2022-07-12 – 2022-07-13 (×3): 15 mg via INTRAVENOUS
  Filled 2022-07-12 (×4): qty 1

## 2022-07-12 MED ORDER — FLEET ENEMA 7-19 GM/118ML RE ENEM: 1.0000 | ENEMA | Freq: Once | RECTAL | Status: DC

## 2022-07-12 NOTE — Progress Notes (Signed)
[facility] PROGRESS NOTE    Progress Note  Cristina Adkins  AVW:[id number] DOB: September 03, 1980 DOA: 07/10/2022 PCP: Sandford Craze, NP     Brief Narrative:   Cristina Adkins is an 42 y.o. female past medical history of essential hypertension, usually in good health until 2 days prior to admission started having suprapubic pain with flank pain he was found to have urinary tract infection, CT scan of the abdomen pelvis showed no acute findings, was started on antibiotics as an outpatient came back today as her symptoms have worsened   Assessment/Plan:   Abdominal pain flank pain and back pain: MRI of the lumbar spine is unremarkable She has not had a BM, ? Is this is the main reason for all, abd pain, leukocytosis and back pain. Fleet enema, miralax BID and sorbitol. If this doe snot work then [employer]. CT scan of the abdomen pelvis shows a normal appendix no stone unremarkable gallbladder. Discontinue narcotics.  Abnormal urinalysis/possible UTI (urinary tract infection): Urine culture from a few days ago is unreliable. Cont levaquin  Essential hypertension Continue amlodipine hold ARB and diuretic therapy.  Hypokalemia: Repleted orally now improved try to keep above 4.  Microcytic anemia: History of iron deficiency anemia, no signs of overt bleeding.   DVT prophylaxis: lovenox Family Communication:none Status is: Inpatient Remains inpatient appropriate because: Acute abdominal pain    Code Status:     Code Status Orders  (From admission, onward)           Start     Ordered   07/10/22 2139  Full code  Continuous       Question:  By:  Answer:  Consent: discussion documented in EHR   07/10/22 2141           Code Status History     Date Active Date Inactive Code Status Order ID Comments User Context   03/29/2017 1923 03/30/2017 2137 Full Code [id number]  Therisa Doyne, MD Inpatient         IV Access:   Peripheral IV   Procedures and  diagnostic studies:   MR LUMBAR SPINE WO CONTRAST  Result Date: 07/11/2022 CLINICAL DATA:  Low back pain, infection suspected, no prior imaging EXAM: MRI LUMBAR SPINE WITHOUT CONTRAST TECHNIQUE: Multiplanar, multisequence MR imaging of the lumbar spine was performed. No intravenous contrast was administered. IV access was unable to be obtained for postcontrast sequences. COMPARISON:  CT 07/06/2022 FINDINGS: Segmentation:  Standard. Alignment:  Physiologic. Vertebrae: Diffusely low T1 and T2 bone marrow signal. No focal marrow replacing bone lesion. Vertebral body heights are maintained without fracture. No evidence of discitis or septic arthritis of the lumbar spine. Conus medullaris and cauda equina: Conus extends to the L1-2 level. Conus and cauda equina appear normal. Paraspinal and other soft tissues: Partially imaged 2 adjacent simple-appearing left adnexal cysts measuring up to 3.8 cm and 3.5 cm in size. No follow-up imaging recommended. Note: This recommendation does not apply to premenarchal patients and to those with increased risk (genetic, family history, elevated tumor markers or other high-risk factors) of ovarian cancer. Reference: JACR 2020 Feb; 17(2):248-254 Disc levels: Negative. Intervertebral disc heights are preserved without disc desiccation or focal disc protrusion. Normal facet joints. No foraminal or canal stenosis at any level. IMPRESSION: 1. No evidence of discitis of the lumbar spine. 2. No significant degenerative changes of the lumbar spine. No foraminal or canal stenosis at any level. 3. Diffusely low bone marrow signal which can be seen in the setting of chronic anemia.  4. Partially imaged left adnexal cysts measuring up to 3.8 cm and 3.5 cm in size. No follow-up imaging recommended. Electronically Signed   By: Duanne Guess D.O.   On: 07/11/2022 16:40   DG Abdomen 1 View  Result Date: 07/10/2022 CLINICAL DATA:  Abdominal pain EXAM: ABDOMEN - 1 VIEW COMPARISON:  CT done on  07/06/2022 FINDINGS: Bowel gas pattern is nonspecific. No abnormal masses or calcifications are seen. Visualized lower lung fields are clear. Bony structures are unremarkable. IMPRESSION: Nonspecific bowel gas pattern. Electronically Signed   By: Ernie Avena M.D.   On: 07/10/2022 16:47     Medical Consultants:   None.   Subjective:    Cristina Adkins  still having pain, cramping abd pain, no BM  Objective:    Vitals:   07/11/22 1257 07/11/22 1633 07/11/22 2043 07/12/22 0532  BP: (!) 149/95 (!) 151/99 (!) 171/99 123/73  Pulse: 95 82 87 91  Resp:  18 16 16   Temp: 98.1 F (36.7 C) 98.6 F (37 C) 98.4 F (36.9 C) 99.5 F (37.5 C)  TempSrc: Oral Oral Oral Oral  SpO2: 98% 100% 100% 97%  Weight:      Height:       SpO2: 97 %   Intake/Output Summary (Last 24 hours) at 07/12/2022 0956 Last data filed at 07/12/2022 0656 Gross per 24 hour  Intake 1616.49 ml  Output 500 ml  Net 1116.49 ml    Filed Weights   07/10/22 1252  Weight: (!) 145.1 kg    Exam: General exam: In no acute distress. Respiratory system: Good air movement and clear to auscultation. Cardiovascular system: S1 & S2 heard, RRR. No JVD. Gastrointestinal system: Abdomen is nondistended, soft and epigastric tenderness and right lower quadrant tenderness Extremities: No pedal edema. Skin: No rashes, lesions or ulcers Psychiatry: Judgement and insight appear normal. Mood & affect appropriate.    Data Reviewed:    Labs: Basic Metabolic Panel: Recent Labs  Lab 07/06/22 0725 07/10/22 1327 07/11/22 0423  NA 136 136 134*  K 4.2 3.2* 3.8  CL 101 107 103  CO2 24 21* 24  GLUCOSE 134* 102* 122*  BUN 13 10 12   CREATININE 0.91 0.82 0.99  CALCIUM 8.6* 7.3* 7.8*  MG  --   --  2.0    GFR Estimated Creatinine Clearance: 106.7 mL/min (by C-G formula based on SCr of 0.99 mg/dL). Liver Function Tests: Recent Labs  Lab 07/06/22 0725 07/10/22 1327 07/11/22 0423  AST 16 25 18   ALT 21 33 31   ALKPHOS 68 86 75  BILITOT 0.3 0.3 0.6  PROT 7.8 7.0 7.1  ALBUMIN 3.2* 2.5* 2.6*    Recent Labs  Lab 07/06/22 0725 07/10/22 1327  LIPASE 31 27    No results for input(s): "AMMONIA" in the last 168 hours. Coagulation profile No results for input(s): "INR", "PROTIME" in the last 168 hours. COVID-19 Labs  Recent Labs    07/11/22 0423  FERRITIN 60     Lab Results  Component Value Date   SARSCOV2NAA POSITIVE (A) 01/08/2021   SARSCOV2NAA Not Detected 10/30/2019   SARSCOV2NAA Not Detected 09/14/2019   SARSCOV2NAA NOT DETECTED 01/02/2019    CBC: Recent Labs  Lab 07/06/22 0725 07/10/22 1327 07/11/22 0423 07/12/22 0629  WBC 14.2* 15.8* 14.7* 14.1*  NEUTROABS 11.4*  --   --  11.2*  HGB 10.3* 9.2* 8.5* 9.3*  HCT 34.5* 30.2* 28.7* 31.5*  MCV 73.2* 73.3* 74.9* 74.6*  PLT 535* 457* 391 469*  Cardiac Enzymes: No results for input(s): "CKTOTAL", "CKMB", "CKMBINDEX", "TROPONINI" in the last 168 hours. BNP (last 3 results) No results for input(s): "PROBNP" in the last 8760 hours. CBG: No results for input(s): "GLUCAP" in the last 168 hours. D-Dimer: No results for input(s): "DDIMER" in the last 72 hours. Hgb A1c: No results for input(s): "HGBA1C" in the last 72 hours. Lipid Profile: No results for input(s): "CHOL", "HDL", "LDLCALC", "TRIG", "CHOLHDL", "LDLDIRECT" in the last 72 hours. Thyroid function studies: No results for input(s): "TSH", "T4TOTAL", "T3FREE", "THYROIDAB" in the last 72 hours.  Invalid input(s): "FREET3" Anemia work up: Recent Labs    07/11/22 0423 07/11/22 0424  VITAMINB12 309  --   FOLATE 7.1  --   FERRITIN 60  --   TIBC 277  --   IRON 19*  --   RETICCTPCT  --  1.7    Sepsis Labs: Recent Labs  Lab 07/06/22 0725 07/10/22 1327 07/11/22 0423 07/12/22 0629  WBC 14.2* 15.8* 14.7* 14.1*    Microbiology Recent Results (from the past 240 hour(s))  Urine Culture     Status: Abnormal   Collection Time: 07/06/22  7:25 AM   Specimen:  Urine, Random  Result Value Ref Range Status   Specimen Description   Final    URINE, RANDOM Performed at [facility], [address] [place], [place] [address]    Special Requests   Final    NONE Reflexed from [id number] Performed at [facility], [address] [place], [place] [address]    Culture MULTIPLE SPECIES PRESENT, SUGGEST RECOLLECTION (A)  Final   Report Status 07/07/2022 FINAL  Final     Medications:    amLODipine  10 mg Oral Daily   cyanocobalamin  1,000 mcg Subcutaneous Daily   docusate sodium  100 mg Oral BID   enoxaparin (LOVENOX) injection  40 mg Subcutaneous Q24H   folic acid  1 mg Oral Daily   polyethylene glycol  17 g Oral BID   Continuous Infusions:  levofloxacin (LEVAQUIN) IV Stopped (07/11/22 2259)      LOS: 2 days   Marinda Elk  [facility]  07/12/2022, 9:56 AM

## 2022-07-13 DIAGNOSIS — K5900 Constipation, unspecified: Principal | ICD-10-CM

## 2022-07-13 LAB — CBC WITH DIFFERENTIAL/PLATELET
Abs Immature Granulocytes: 0.22 10*3/uL — ABNORMAL HIGH (ref 0.00–0.07)
Basophils Absolute: 0.1 10*3/uL (ref 0.0–0.1)
Basophils Relative: 1 %
Eosinophils Absolute: 0.1 10*3/uL (ref 0.0–0.5)
Eosinophils Relative: 1 %
HCT: 31 % — ABNORMAL LOW (ref 36.0–46.0)
Hemoglobin: 9.4 g/dL — ABNORMAL LOW (ref 12.0–15.0)
Immature Granulocytes: 2 %
Lymphocytes Relative: 11 %
Lymphs Abs: 1.4 10*3/uL (ref 0.7–4.0)
MCH: 22.4 pg — ABNORMAL LOW (ref 26.0–34.0)
MCHC: 30.3 g/dL (ref 30.0–36.0)
MCV: 74 fL — ABNORMAL LOW (ref 80.0–100.0)
Monocytes Absolute: 0.9 10*3/uL (ref 0.1–1.0)
Monocytes Relative: 7 %
Neutro Abs: 9.6 10*3/uL — ABNORMAL HIGH (ref 1.7–7.7)
Neutrophils Relative %: 78 %
Platelets: 471 10*3/uL — ABNORMAL HIGH (ref 150–400)
RBC: 4.19 MIL/uL (ref 3.87–5.11)
RDW: 22.9 % — ABNORMAL HIGH (ref 11.5–15.5)
WBC: 12.3 10*3/uL — ABNORMAL HIGH (ref 4.0–10.5)
nRBC: 0.2 % (ref 0.0–0.2)

## 2022-07-13 MED ORDER — DICYCLOMINE HCL 20 MG PO TABS
20.0000 mg | ORAL_TABLET | Freq: Three times a day (TID) | ORAL | Status: DC
  Administered 2022-07-13 – 2022-07-14 (×4): 20 mg via ORAL
  Filled 2022-07-13 (×4): qty 1

## 2022-07-13 MED ORDER — BISACODYL 5 MG PO TBEC
5.0000 mg | DELAYED_RELEASE_TABLET | Freq: Once | ORAL | Status: AC
  Administered 2022-07-13: 5 mg via ORAL
  Filled 2022-07-13: qty 1

## 2022-07-13 MED ORDER — VALSARTAN-HYDROCHLOROTHIAZIDE 320-25 MG PO TABS: 1.0000 | ORAL_TABLET | Freq: Every day | ORAL | Status: DC

## 2022-07-13 MED ORDER — IRBESARTAN 300 MG PO TABS
300.0000 mg | ORAL_TABLET | Freq: Every day | ORAL | Status: DC
  Administered 2022-07-13 – 2022-07-14 (×2): 300 mg via ORAL
  Filled 2022-07-13 (×2): qty 1

## 2022-07-13 MED ORDER — HYDROCHLOROTHIAZIDE 25 MG PO TABS
25.0000 mg | ORAL_TABLET | Freq: Every day | ORAL | Status: DC
  Administered 2022-07-13 – 2022-07-14 (×2): 25 mg via ORAL
  Filled 2022-07-13 (×2): qty 1

## 2022-07-13 NOTE — Progress Notes (Signed)
[facility] PROGRESS NOTE    Progress Note  Hadie Bitney  ZOX:[id number] DOB: 08-23-1980 DOA: 07/10/2022 PCP: Sandford Craze, NP     Brief Narrative:   Skarleth Ballejos is an 42 y.o. female past medical history of essential hypertension, usually in good health until 2 days prior to admission started having suprapubic pain with flank pain he was found to have urinary tract infection, CT scan of the abdomen pelvis showed no acute findings, was started on antibiotics as an outpatient came back today as her symptoms have worsened   Assessment/Plan:   Abdominal pain flank pain and back pain: MRI of the lumbar spine is unremarkable ? Constipation: Fleet enema, miralax BID and sorbitol. CT scan of the abdomen pelvis shows a normal appendix no stone unremarkable gallbladder. Discontinue narcotics. -unclear etiology-- may need to repeat CT scan  Abnormal urinalysis/possible UTI (urinary tract infection): Urine culture from a few days ago is unreliable. -finished abx  Essential hypertension -resume home meds  Hypokalemia: Replete  Microcytic anemia: History of iron deficiency anemia, no signs of overt bleeding.   DVT prophylaxis: lovenox Family Communication:none Status is: Inpatient Remains inpatient appropriate    Code Status:  full     Medical Consultants:   None.   Subjective:    Continues with abdominal cramps  Objective:    Vitals:   07/12/22 1339 07/12/22 2101 07/13/22 0514 07/13/22 1354  BP: (!) 141/88 (!) 159/75 (!) 158/95 (!) 174/102  Pulse: 83 97 93 84  Resp: 18 16 16    Temp: 98 F (36.7 C) 98.3 F (36.8 C) 98.3 F (36.8 C) 98.5 F (36.9 C)  TempSrc: Oral Oral Oral Oral  SpO2: 100% 100% 100% 100%  Weight:      Height:       SpO2: 100 %   Intake/Output Summary (Last 24 hours) at 07/13/2022 1400 Last data filed at 07/13/2022 0140 Gross per 24 hour  Intake 390.03 ml  Output --  Net 390.03 ml   Filed Weights   07/10/22  1252  Weight: (!) 145.1 kg    Exam: In bed, NAD   Data Reviewed:    Labs: Basic Metabolic Panel: Recent Labs  Lab 07/10/22 1327 07/11/22 0423  NA 136 134*  K 3.2* 3.8  CL 107 103  CO2 21* 24  GLUCOSE 102* 122*  BUN 10 12  CREATININE 0.82 0.99  CALCIUM 7.3* 7.8*  MG  --  2.0   GFR Estimated Creatinine Clearance: 106.7 mL/min (by C-G formula based on SCr of 0.99 mg/dL). Liver Function Tests: Recent Labs  Lab 07/10/22 1327 07/11/22 0423  AST 25 18  ALT 33 31  ALKPHOS 86 75  BILITOT 0.3 0.6  PROT 7.0 7.1  ALBUMIN 2.5* 2.6*   Recent Labs  Lab 07/10/22 1327  LIPASE 27   No results for input(s): "AMMONIA" in the last 168 hours. Coagulation profile No results for input(s): "INR", "PROTIME" in the last 168 hours. COVID-19 Labs  Recent Labs    07/11/22 0423  FERRITIN 60    Lab Results  Component Value Date   SARSCOV2NAA POSITIVE (A) 01/08/2021   SARSCOV2NAA Not Detected 10/30/2019   SARSCOV2NAA Not Detected 09/14/2019   SARSCOV2NAA NOT DETECTED 01/02/2019    CBC: Recent Labs  Lab 07/10/22 1327 07/11/22 0423 07/12/22 0629 07/13/22 0504  WBC 15.8* 14.7* 14.1* 12.3*  NEUTROABS  --   --  11.2* 9.6*  HGB 9.2* 8.5* 9.3* 9.4*  HCT 30.2* 28.7* 31.5* 31.0*  MCV 73.3* 74.9* 74.6*  74.0*  PLT 457* 391 469* 471*   Cardiac Enzymes: No results for input(s): "CKTOTAL", "CKMB", "CKMBINDEX", "TROPONINI" in the last 168 hours. BNP (last 3 results) No results for input(s): "PROBNP" in the last 8760 hours. CBG: No results for input(s): "GLUCAP" in the last 168 hours. D-Dimer: No results for input(s): "DDIMER" in the last 72 hours. Hgb A1c: No results for input(s): "HGBA1C" in the last 72 hours. Lipid Profile: No results for input(s): "CHOL", "HDL", "LDLCALC", "TRIG", "CHOLHDL", "LDLDIRECT" in the last 72 hours. Thyroid function studies: No results for input(s): "TSH", "T4TOTAL", "T3FREE", "THYROIDAB" in the last 72 hours.  Invalid input(s):  "FREET3" Anemia work up: Recent Labs    07/11/22 0423 07/11/22 0424  VITAMINB12 309  --   FOLATE 7.1  --   FERRITIN 60  --   TIBC 277  --   IRON 19*  --   RETICCTPCT  --  1.7   Sepsis Labs: Recent Labs  Lab 07/10/22 1327 07/11/22 0423 07/12/22 0629 07/13/22 0504  WBC 15.8* 14.7* 14.1* 12.3*   Microbiology Recent Results (from the past 240 hour(s))  Urine Culture     Status: Abnormal   Collection Time: 07/06/22  7:25 AM   Specimen: Urine, Random  Result Value Ref Range Status   Specimen Description   Final    URINE, RANDOM Performed at [facility], [address] [place], [place] [address]    Special Requests   Final    NONE Reflexed from [id number] Performed at [facility], [address] [place], [place] [address]    Culture MULTIPLE SPECIES PRESENT, SUGGEST RECOLLECTION (A)  Final   Report Status 07/07/2022 FINAL  Final  Urine Culture (for pregnant, neutropenic or urologic patients or patients with an indwelling urinary catheter)     Status: None   Collection Time: 07/11/22 10:57 AM   Specimen: Urine, Clean Catch  Result Value Ref Range Status   Specimen Description   Final    URINE, CLEAN CATCH Performed at [facility], [address], [place], [place] [address]    Special Requests   Final    NONE Performed at [facility], [address], [place], [place] [address]    Culture   Final    NO GROWTH Performed at [facility], [address], [place], [place] [address]    Report Status 07/12/2022 FINAL  Final     Medications:    amLODipine  10 mg Oral Daily   cyanocobalamin  1,000 mcg Subcutaneous Daily   dicyclomine  20 mg Oral TID AC & HS   docusate sodium  100 mg Oral BID   enoxaparin (LOVENOX) injection  40 mg Subcutaneous Q24H   folic acid  1 mg Oral Daily   polyethylene glycol  17 g Oral BID   sodium phosphate  1 enema Rectal Once   sorbitol, milk of mag, mineral oil, glycerin  (SMOG) enema  960 mL Rectal Once   valsartan-hydrochlorothiazide  1 tablet Oral Daily   Continuous Infusions:      LOS: 3 days   Joseph Art  [facility]  07/13/2022, 2:00 PM

## 2022-07-14 DIAGNOSIS — K5900 Constipation, unspecified: Secondary | ICD-10-CM | POA: Diagnosis not present

## 2022-07-14 LAB — CBC WITH DIFFERENTIAL/PLATELET
Abs Immature Granulocytes: 0.18 10*3/uL — ABNORMAL HIGH (ref 0.00–0.07)
Basophils Absolute: 0.1 10*3/uL (ref 0.0–0.1)
Basophils Relative: 1 %
Eosinophils Absolute: 0.1 10*3/uL (ref 0.0–0.5)
Eosinophils Relative: 1 %
HCT: 30.7 % — ABNORMAL LOW (ref 36.0–46.0)
Hemoglobin: 9.2 g/dL — ABNORMAL LOW (ref 12.0–15.0)
Immature Granulocytes: 2 %
Lymphocytes Relative: 14 %
Lymphs Abs: 1.5 10*3/uL (ref 0.7–4.0)
MCH: 22.1 pg — ABNORMAL LOW (ref 26.0–34.0)
MCHC: 30 g/dL (ref 30.0–36.0)
MCV: 73.8 fL — ABNORMAL LOW (ref 80.0–100.0)
Monocytes Absolute: 0.8 10*3/uL (ref 0.1–1.0)
Monocytes Relative: 8 %
Neutro Abs: 8.1 10*3/uL — ABNORMAL HIGH (ref 1.7–7.7)
Neutrophils Relative %: 74 %
Platelets: 474 10*3/uL — ABNORMAL HIGH (ref 150–400)
RBC: 4.16 MIL/uL (ref 3.87–5.11)
RDW: 22.4 % — ABNORMAL HIGH (ref 11.5–15.5)
WBC: 10.8 10*3/uL — ABNORMAL HIGH (ref 4.0–10.5)
nRBC: 0 % (ref 0.0–0.2)

## 2022-07-14 LAB — BASIC METABOLIC PANEL
Anion gap: 8 (ref 5–15)
BUN: 12 mg/dL (ref 6–20)
CO2: 25 mmol/L (ref 22–32)
Calcium: 8.4 mg/dL — ABNORMAL LOW (ref 8.9–10.3)
Chloride: 104 mmol/L (ref 98–111)
Creatinine, Ser: 0.87 mg/dL (ref 0.44–1.00)
GFR, Estimated: >60 mL/min (ref >60)
Glucose, Bld: 111 mg/dL — ABNORMAL HIGH (ref 70–99)
Potassium: 3.7 mmol/L (ref 3.5–5.1)
Sodium: 137 mmol/L (ref 135–145)

## 2022-07-14 LAB — GC/CHLAMYDIA PROBE AMP (~~LOC~~) NOT AT ARMC
Chlamydia: NEGATIVE
Comment: NEGATIVE
Comment: NORMAL
Neisseria Gonorrhea: NEGATIVE

## 2022-07-14 MED ORDER — FIBER ADULT GUMMIES 2 G PO CHEW: CHEWABLE_TABLET | ORAL | Status: AC

## 2022-07-14 MED ORDER — POLYETHYLENE GLYCOL 3350 17 G PO PACK: 17.0000 g | PACK | Freq: Two times a day (BID) | ORAL | 0 refills | Status: AC

## 2022-07-14 MED ORDER — DOCUSATE SODIUM 100 MG PO CAPS: 100.0000 mg | ORAL_CAPSULE | Freq: Two times a day (BID) | ORAL | 0 refills | Status: DC

## 2022-07-14 NOTE — Discharge Summary (Signed)
Physician Discharge Summary  Cristina Adkins ZOX:[id number] DOB: 12-31-1980 DOA: 07/10/2022  PCP: Sandford Craze, NP  Admit date: 07/10/2022 Discharge date: 07/14/2022  Admitted From: home Discharge disposition: home   Recommendations for Outpatient Follow-Up:   CBC, BMP at next office visit   Discharge Diagnosis:   Principal Problem:   UTI (urinary tract infection) Active Problems:   Essential hypertension   Severe obesity (BMI >= 40) (HCC)   Back pain   Abdominal pain    Discharge Condition: Improved.  Diet recommendation: Low sodium, heart healthy  Wound care: None.  Code status: Full.   History of Present Illness:   Cristina Adkins is a 42 y.o. female with a past medical history of essential hypertension who was in her usual state of health till past Friday when she started developing pain in the right side of her abdomen.  Radiated to the flank area.  She denied any dysuria nausea vomiting at that time.  She presented to the emergency department on Monday.  She was found to have abnormal UA.  CT scan of the abdomen pelvis did not show any acute findings.  Her symptoms were attributed to a urinary tract infection.  She was discharged home on oral antibiotics.  She presented today back to the emergency department with no relief in her symptoms over the last 4 to 5 days.  She has again pain in the right side of the abdomen also in the back area.  She denies any injuries recently.  No falls.  No recent procedures or surgeries.  No fever per se.  Again denies any dysuria.  She does mention that she has been constipated for the past several days and is unsure if she has been passing any gas.  She is also not certain if the back pain is radiating to the front of the abdominal pain is radiating to the back.  She denies any vaginal bleeding currently.  Did have spotting a few days ago.  Also had vaginal itching a few days ago but none currently.  Is able to ambulate  although motion does increase her pain.   In the emergency department she was again found to have elevated WBC and abnormal UA.  Plain film did not show any acute findings.  Urine culture from her last visit grew multiple species.  Due to persistent symptoms she was hospitalized for further evaluation and management.   Hospital Course by Problem:   Abdominal pain flank pain and back pain: MRI of the lumbar spine is unremarkable ? Constipation: bowel regimen CT scan of the abdomen pelvis shows a normal appendix no stone unremarkable gallbladder. Discontinue narcotics. -improved    Abnormal urinalysis/possible UTI (urinary tract infection): Urine culture from a few days ago is unreliable. -finished abx   Essential hypertension -resume home meds   Hypokalemia: Replete   Microcytic anemia: History of iron deficiency anemia, no signs of overt bleeding.    Medical Consultants:      Discharge Exam:   Vitals:   07/13/22 2121 07/14/22 0635  BP: (!) 143/95 (!) 141/93  Pulse: 83 86  Resp: 18 17  Temp: 98.4 F (36.9 C) 98.3 F (36.8 C)  SpO2: 99% 97%   Vitals:   07/13/22 0514 07/13/22 1354 07/13/22 2121 07/14/22 0635  BP: (!) 158/95 (!) 174/102 (!) 143/95 (!) 141/93  Pulse: 93 84 83 86  Resp: 16  18 17   Temp: 98.3 F (36.8 C) 98.5 F (36.9 C) 98.4 F (36.9  C) 98.3 F (36.8 C)  TempSrc: Oral Oral Oral Oral  SpO2: 100% 100% 99% 97%  Weight:      Height:        General exam: Appears calm and comfortable.    The results of significant diagnostics from this hospitalization (including imaging, microbiology, ancillary and laboratory) are listed below for reference.     Procedures and Diagnostic Studies:   MR LUMBAR SPINE WO CONTRAST  Result Date: 07/11/2022 CLINICAL DATA:  Low back pain, infection suspected, no prior imaging EXAM: MRI LUMBAR SPINE WITHOUT CONTRAST TECHNIQUE: Multiplanar, multisequence MR imaging of the lumbar spine was performed. No intravenous  contrast was administered. IV access was unable to be obtained for postcontrast sequences. COMPARISON:  CT 07/06/2022 FINDINGS: Segmentation:  Standard. Alignment:  Physiologic. Vertebrae: Diffusely low T1 and T2 bone marrow signal. No focal marrow replacing bone lesion. Vertebral body heights are maintained without fracture. No evidence of discitis or septic arthritis of the lumbar spine. Conus medullaris and cauda equina: Conus extends to the L1-2 level. Conus and cauda equina appear normal. Paraspinal and other soft tissues: Partially imaged 2 adjacent simple-appearing left adnexal cysts measuring up to 3.8 cm and 3.5 cm in size. No follow-up imaging recommended. Note: This recommendation does not apply to premenarchal patients and to those with increased risk (genetic, family history, elevated tumor markers or other high-risk factors) of ovarian cancer. Reference: JACR 2020 Feb; 17(2):248-254 Disc levels: Negative. Intervertebral disc heights are preserved without disc desiccation or focal disc protrusion. Normal facet joints. No foraminal or canal stenosis at any level. IMPRESSION: 1. No evidence of discitis of the lumbar spine. 2. No significant degenerative changes of the lumbar spine. No foraminal or canal stenosis at any level. 3. Diffusely low bone marrow signal which can be seen in the setting of chronic anemia. 4. Partially imaged left adnexal cysts measuring up to 3.8 cm and 3.5 cm in size. No follow-up imaging recommended. Electronically Signed   By: Duanne Guess D.O.   On: 07/11/2022 16:40   DG Abdomen 1 View  Result Date: 07/10/2022 CLINICAL DATA:  Abdominal pain EXAM: ABDOMEN - 1 VIEW COMPARISON:  CT done on 07/06/2022 FINDINGS: Bowel gas pattern is nonspecific. No abnormal masses or calcifications are seen. Visualized lower lung fields are clear. Bony structures are unremarkable. IMPRESSION: Nonspecific bowel gas pattern. Electronically Signed   By: Ernie Avena M.D.   On: 07/10/2022  16:47     Labs:   Basic Metabolic Panel: Recent Labs  Lab 07/10/22 1327 07/11/22 0423 07/14/22 0600  NA 136 134* 137  K 3.2* 3.8 3.7  CL 107 103 104  CO2 21* 24 25  GLUCOSE 102* 122* 111*  BUN 10 12 12   CREATININE 0.82 0.99 0.87  CALCIUM 7.3* 7.8* 8.4*  MG  --  2.0  --    GFR Estimated Creatinine Clearance: 121.4 mL/min (by C-G formula based on SCr of 0.87 mg/dL). Liver Function Tests: Recent Labs  Lab 07/10/22 1327 07/11/22 0423  AST 25 18  ALT 33 31  ALKPHOS 86 75  BILITOT 0.3 0.6  PROT 7.0 7.1  ALBUMIN 2.5* 2.6*   Recent Labs  Lab 07/10/22 1327  LIPASE 27   No results for input(s): "AMMONIA" in the last 168 hours. Coagulation profile No results for input(s): "INR", "PROTIME" in the last 168 hours.  CBC: Recent Labs  Lab 07/10/22 1327 07/11/22 0423 07/12/22 0629 07/13/22 0504 07/14/22 0600  WBC 15.8* 14.7* 14.1* 12.3* 10.8*  NEUTROABS  --   --  11.2* 9.6* 8.1*  HGB 9.2* 8.5* 9.3* 9.4* 9.2*  HCT 30.2* 28.7* 31.5* 31.0* 30.7*  MCV 73.3* 74.9* 74.6* 74.0* 73.8*  PLT 457* 391 469* 471* 474*   Cardiac Enzymes: No results for input(s): "CKTOTAL", "CKMB", "CKMBINDEX", "TROPONINI" in the last 168 hours. BNP: Invalid input(s): "POCBNP" CBG: No results for input(s): "GLUCAP" in the last 168 hours. D-Dimer No results for input(s): "DDIMER" in the last 72 hours. Hgb A1c No results for input(s): "HGBA1C" in the last 72 hours. Lipid Profile No results for input(s): "CHOL", "HDL", "LDLCALC", "TRIG", "CHOLHDL", "LDLDIRECT" in the last 72 hours. Thyroid function studies No results for input(s): "TSH", "T4TOTAL", "T3FREE", "THYROIDAB" in the last 72 hours.  Invalid input(s): "FREET3" Anemia work up No results for input(s): "VITAMINB12", "FOLATE", "FERRITIN", "TIBC", "IRON", "RETICCTPCT" in the last 72 hours. Microbiology Recent Results (from the past 240 hour(s))  Urine Culture     Status: Abnormal   Collection Time: 07/06/22  7:25 AM   Specimen:  Urine, Random  Result Value Ref Range Status   Specimen Description   Final    URINE, RANDOM Performed at [facility], [address] [place], [place] [address]    Special Requests   Final    NONE Reflexed from [id number] Performed at [facility], [address] [place], [place] [address]    Culture MULTIPLE SPECIES PRESENT, SUGGEST RECOLLECTION (A)  Final   Report Status 07/07/2022 FINAL  Final  Urine Culture (for pregnant, neutropenic or urologic patients or patients with an indwelling urinary catheter)     Status: None   Collection Time: 07/11/22 10:57 AM   Specimen: Urine, Clean Catch  Result Value Ref Range Status   Specimen Description   Final    URINE, CLEAN CATCH Performed at [facility], [address], [place], [place] [address]    Special Requests   Final    NONE Performed at [facility], [address] [place], [place] [address]    Culture   Final    NO GROWTH Performed at [facility], [address], [place], [place] [address]    Report Status 07/12/2022 FINAL  Final     Discharge Instructions:   Discharge Instructions     Diet - low sodium heart healthy   Complete by: As directed    Increase activity slowly   Complete by: As directed    No wound care   Complete by: As directed       Allergies as of 07/14/2022       Reactions   Latex Other (See Comments)   Irritated and burning skin   Other Swelling, Other (See Comments)   Allergic to all fruit and raw tomatoes reaction face swells        Medication List     STOP taking these medications    cefdinir 300 MG capsule Commonly known as: OMNICEF       TAKE these medications    albuterol 108 (90 Base) MCG/ACT inhaler Commonly known as: VENTOLIN HFA Inhale 2 puffs into the lungs every 6 (six) hours as needed for wheezing or shortness of breath.   amLODipine 10 MG tablet Commonly known as: NORVASC Take 1 tablet (10 mg  total) by mouth daily.   docusate sodium 100 MG capsule Commonly known as: COLACE Take 1 capsule (100 mg total) by mouth 2 (two) times daily.   EPINEPHrine 0.3 mg/0.3 mL Soaj injection Commonly known as:  EPI-PEN Inject 0.3 mg into the muscle as needed for anaphylaxis.   Fiber Adult Gummies 2 g Chew Take a directed on bottle   multivitamin with minerals tablet Take 1 tablet by mouth daily.   ondansetron 4 MG disintegrating tablet Commonly known as: ZOFRAN-ODT Take 1 tablet (4 mg total) by mouth every 8 (eight) hours as needed for nausea or vomiting.   oxyCODONE 5 MG immediate release tablet Commonly known as: Roxicodone Take 1 tablet (5 mg total) by mouth every 4 (four) hours as needed for severe pain.   polyethylene glycol 17 g packet Commonly known as: MIRALAX / GLYCOLAX Take 17 g by mouth 2 (two) times daily.   valsartan-hydrochlorothiazide 320-25 MG tablet Commonly known as: DIOVAN-HCT Take 1 tablet by mouth daily.   Zepbound 7.5 MG/0.5ML Pen Generic drug: tirzepatide Inject 7.5 mg into the skin once a week.        Follow-up Information     Sandford Craze, NP Follow up.   Specialty: Internal Medicine Contact information: [address] [place] [address] [phone]                  Time coordinating discharge: 45 min  Signed:  Joseph Art DO  [facility] 07/14/2022, 8:38 AM

## 2022-07-15 ENCOUNTER — Telehealth: Payer: Self-pay

## 2022-07-15 NOTE — Transitions of Care (Post Inpatient/ED Visit) (Signed)
07/15/2022  Name: Cristina Adkins MRN: [id number] DOB: 1981/01/19  Today's TOC FU Call Status: Today's TOC FU Call Status:: Successful TOC FU Call Competed TOC FU Call Complete Date: 07/15/22  Transition Care Management Follow-up Telephone Call Date of Discharge: 07/14/22 Discharge Facility: [name]) Type of Discharge: Inpatient Admission Primary Inpatient Discharge Diagnosis:: nephritis How have you been since you were released from the hospital?: Better Any questions or concerns?: No  Items Reviewed: Did you receive and understand the discharge instructions provided?: Yes Medications obtained,verified, and reconciled?: Yes (Medications Reviewed) Any new allergies since your discharge?: No Dietary orders reviewed?: Yes Do you have support at home?: Yes People in Home: child(ren), adult  Medications Reviewed Today: Medications Reviewed Today     Reviewed by Karena Addison, LPN (Licensed Practical Nurse) on 07/15/22 at [phone]  Med List Status: <None>   Medication Order Taking? Sig Documenting Provider Last Dose Status Informant  albuterol (VENTOLIN HFA) 108 (90 Base) MCG/ACT inhaler [id number] No Inhale 2 puffs into the lungs every 6 (six) hours as needed for wheezing or shortness of breath. Ferol Luz, MD Unknown Active Self, Pharmacy Records  amLODipine (NORVASC) 10 MG tablet [id number] No Take 1 tablet (10 mg total) by mouth daily. Sandford Craze, NP 07/10/2022 Active Self, Pharmacy Records  docusate sodium (COLACE) 100 MG capsule [id number]  Take 1 capsule (100 mg total) by mouth 2 (two) times daily. Joseph Art, DO  Active   EPINEPHrine 0.3 mg/0.3 mL IJ SOAJ injection [id number] No Inject 0.3 mg into the muscle as needed for anaphylaxis. Sandford Craze, NP unknown Active Self, Pharmacy Records  Fiber Adult Gummies 2 g CHEW [id number]  Take a directed on bottle Joseph Art, DO  Active   Multiple Vitamins-Minerals (MULTIVITAMIN WITH MINERALS) tablet  [id number] No Take 1 tablet by mouth daily. Sandford Craze, NP Unknown Active Self, Pharmacy Records  ondansetron (ZOFRAN-ODT) 4 MG disintegrating tablet [id number] No Take 1 tablet (4 mg total) by mouth every 8 (eight) hours as needed for nausea or vomiting. Alvira Monday, MD n/a Active Self, Pharmacy Records           Med Note [facility] [place] A   Fri Jul 10, 2022  2:53 PM) Pt has not started  oxyCODONE (ROXICODONE) 5 MG immediate release tablet [id number] No Take 1 tablet (5 mg total) by mouth every 4 (four) hours as needed for severe pain. Alvira Monday, MD 07/09/2022 Active Self, Pharmacy Records  polyethylene glycol (MIRALAX / GLYCOLAX) 17 g packet [id number]  Take 17 g by mouth 2 (two) times daily. Joseph Art, DO  Active   tirzepatide (ZEPBOUND) 7.5 MG/0.5ML Pen [id number] No Inject 7.5 mg into the skin once a week. Sandford Craze, NP unknown Active Self, Pharmacy Records  valsartan-hydrochlorothiazide (DIOVAN-HCT) 320-25 MG tablet [id number] No Take 1 tablet by mouth daily. Sandford Craze, NP 07/10/2022 Active Self, Pharmacy Records            Home Care and Equipment/Supplies: Were Home Health Services Ordered?: NA Any new equipment or medical supplies ordered?: NA  Functional Questionnaire: Do you need assistance with bathing/showering or dressing?: No Do you need assistance with meal preparation?: No Do you need assistance with eating?: No Do you have difficulty maintaining continence: No Do you need assistance with getting out of bed/getting out of a chair/moving?: No Do you have difficulty managing or taking your medications?: No  Follow up appointments reviewed: PCP Follow-up appointment confirmed?: Yes Date of PCP follow-up appointment?: 07/27/22 Follow-up Provider: Peggyann Juba  Specialist Hospital Follow-up appointment confirmed?: NA Do you need transportation to your follow-up appointment?: No Do you understand care options if your condition(s)  worsen?: Yes-patient verbalized understanding    SIGNATURE Karena Addison, LPN [facility] Nurse Health Advisor Direct Dial [phone]

## 2022-07-27 ENCOUNTER — Inpatient Hospital Stay: Payer: No Typology Code available for payment source | Admitting: Family

## 2022-08-04 ENCOUNTER — Ambulatory Visit: Payer: No Typology Code available for payment source | Admitting: Family

## 2022-08-04 ENCOUNTER — Telehealth: Payer: Self-pay | Admitting: Family

## 2022-08-04 VITALS — BP 157/102 | HR 87 | Temp 99.2°F | Resp 18 | Ht 61.0 in | Wt 330.0 lb

## 2022-08-04 DIAGNOSIS — E611 Iron deficiency: Secondary | ICD-10-CM

## 2022-08-04 DIAGNOSIS — I10 Essential (primary) hypertension: Secondary | ICD-10-CM | POA: Diagnosis not present

## 2022-08-04 DIAGNOSIS — D649 Anemia, unspecified: Secondary | ICD-10-CM | POA: Diagnosis not present

## 2022-08-04 DIAGNOSIS — Z87891 Personal history of nicotine dependence: Secondary | ICD-10-CM

## 2022-08-04 DIAGNOSIS — R809 Proteinuria, unspecified: Secondary | ICD-10-CM

## 2022-08-04 DIAGNOSIS — R739 Hyperglycemia, unspecified: Secondary | ICD-10-CM

## 2022-08-04 DIAGNOSIS — Z6841 Body Mass Index (BMI) 40.0 and over, adult: Secondary | ICD-10-CM

## 2022-08-04 DIAGNOSIS — E559 Vitamin D deficiency, unspecified: Secondary | ICD-10-CM | POA: Diagnosis not present

## 2022-08-04 DIAGNOSIS — G4733 Obstructive sleep apnea (adult) (pediatric): Secondary | ICD-10-CM

## 2022-08-04 DIAGNOSIS — E538 Deficiency of other specified B group vitamins: Secondary | ICD-10-CM

## 2022-08-04 DIAGNOSIS — N3000 Acute cystitis without hematuria: Secondary | ICD-10-CM

## 2022-08-04 LAB — METHYLMALONIC ACID(MMA), RND URINE
Creatinine(Crt), U: 1.09 g/L (ref 0.30–3.00)
MMA - Normalized: 0.6 umol/mmol cr (ref 0.5–3.4)
Methylmalonic Acid, Ur: 6 umol/L (ref 1.6–29.7)

## 2022-08-04 MED ORDER — CARVEDILOL 3.125 MG PO TABS
3.1250 mg | ORAL_TABLET | Freq: Two times a day (BID) | ORAL | 3 refills | Status: DC
Start: 2022-08-04 — End: 2022-12-10

## 2022-08-04 MED ORDER — VITAMIN B-12 1000 MCG PO TABS: 1000.0000 ug | ORAL_TABLET | Freq: Every day | ORAL | Status: AC

## 2022-08-04 MED ORDER — IRON (FERROUS SULFATE) 325 (65 FE) MG PO TABS: 325.0000 mg | ORAL_TABLET | ORAL | Status: AC

## 2022-08-04 NOTE — Assessment & Plan Note (Signed)
Clinically resolved.  

## 2022-08-04 NOTE — Patient Instructions (Signed)
VISIT SUMMARY:  During your visit, we discussed several health issues including your high blood pressure, weight gain, low vitamin B12 levels, anemia, vitamin D deficiency, sleep apnea, constipation, and your recent urinary tract infection. We also talked about your recent hospitalization and the tests you underwent.  YOUR PLAN:  -HIGH BLOOD PRESSURE: Your blood pressure has been high even with medication. We're adding a new medication, Carvedilol, to help control it. Please monitor your blood pressure at home and follow up in 2 weeks.  -WEIGHT GAIN: Elvera Bicker been struggling with weight gain and difficulty losing weight. We're referring [facility] to [facility] for a comprehensive weight management program. Please continue to exercise regularly and maintain a healthy diet.  -VITAMIN B12 DEFICIENCY: Your vitamin B12 levels are on the lower side of normal. We're recommending you start taking an over-the-counter B12 supplement daily. We'll recheck your B12 level in 3 months.  -IRON DEFICIENCY ANEMIA: Your anemia is stable, and we'll continue to monitor it.  -VITAMIN D DEFICIENCY: You've previously had low vitamin D levels. We're going to check your vitamin D level today.  -SLEEP APNEA: You've reported snoring and daytime fatigue. Please use your CPAP machine regularly for at least 4 hours per night to help manage these symptoms.I will also try to get you back in with your sleep specialist.    -CONSTIPATION: You were recently hospitalized with abdominal pain due to constipation. Please include more fiber in your diet to help manage this.  -URINARY TRACT INFECTION: You were recently hospitalized for a urinary tract infection, but your symptoms have resolved after completing a course of antibiotics. No further action is needed at this time.  -SCREENING FOR OCCULT GASTROINTESTINAL BLEEDING: Due to your stable anemia, we're providing a stool guaiac test kit to check for any hidden  blood in your stool.  INSTRUCTIONS:  Please start taking the new blood pressure medication, Carvedilol, and monitor your blood pressure at home. Follow up in 2 weeks. Start taking an over-the-counter B12 supplement daily. Use your CPAP machine regularly for at least 4 hours per night. Include more fiber in your diet. Use the stool guaiac test kit as instructed.

## 2022-08-04 NOTE — Progress Notes (Signed)
Subjective:     Patient ID: Cristina Adkins, female    DOB: [birth year], 42 y.o.   MRN: [id number]  Chief Complaint  Patient presents with   Hospitalization Follow-up    Cristina Adkins 07/14/2022 dx: nephritis Concerns/ questions:      HPI  Discussed the use of AI scribe software for clinical note transcription with the patient, who gave verbal consent to proceed.  History of Present Illness   The patient, a 42 year old female with a history of microcytic anemia, and hypertension, presents for a follow-up after a recent hospital admission. Initial visit to the ER occurred on 07/06/22 for RLQ pain, diagnosed as UTI and treated with cefdinir. She reports that abdominal pain worsened prompting her to return to the ER on 6/14.  She was admitted for UTI and abdominal pain. CT abdomen was unremarkable.  She was treated with IV antibiotics and constipation with relief of her pain. Subsequent urine culture was unremarkable.  The patient's potassium levels were low during the hospital stay and were repleted. The patient's microcytic anemia was stable, and B12 levels were low normal. Iron levels were slightly low at 19. The patient also reports weight gain since discontinuing Majero, a medication previously prescribed for an unspecified condition. The patient also reports fatigue, lack of energy, and shortness of breath, particularly when climbing stairs.  The patient has a history of snoring and occasional leg swelling, which is thought to be a side effect of amlodipine, a medication the patient is taking for hypertension. The patient's blood pressure readings have been consistently high, even during the hospital stay. The patient also reports back pain and has had an MRI of the lumbar spine, which showed no significant changes.    Reports fatigue and frustration with her inability to lose weight.  Admits to poor cpap compliance.      Wt Readings from Last 3 Encounters:  08/04/22 (!) 330 lb (149.7 kg)   07/10/22 (!) 319 lb 14.2 oz (145.1 kg)  07/06/22 (!) 320 lb (145.2 kg)   BP Readings from Last 3 Encounters:  08/04/22 (!) 157/102  07/14/22 (!) 141/93  07/06/22 (!) 155/99    Lab Results  Component Value Date   HGBA1C 6.2 08/12/2021      Health Maintenance Due  Topic Date Due   COVID-19 Vaccine (1) Never done   Hepatitis C Screening  Never done   DTaP/Tdap/Td (1 - Tdap) Never done    Past Medical History:  Diagnosis Date   History of alcohol abuse    Hypertension    Hypokalemia    Iron deficiency     No past surgical history on file.  Family History  Problem Relation Age of Onset   Hypertension Mother    Prostate cancer Father    Hypertension Brother    Hypertension Maternal Grandmother    Diabetes Daughter    Lung cancer Daughter    Heart disease Maternal Uncle    Diabetes Maternal Uncle    Colon cancer Maternal Uncle     Social History   Socioeconomic History   Marital status: Single    Spouse name: Not on file   Number of children: 1   Years of education: Not on file   Highest education level: Associate degree: occupational, Scientist, product/process development, or vocational program  Occupational History   Occupation: Advice worker  Tobacco Use   Smoking status: Former    Packs/day: 1.00    Years: 9.00    Additional pack years: 0.00  Total pack years: 9.00    Types: Cigarettes    Quit date: 11/21/2020    Years since quitting: 1.7   Smokeless tobacco: Never  Vaping Use   Vaping Use: Some days   Substances: Nicotine  Substance and Sexual Activity   Alcohol use: No   Drug use: No   Sexual activity: Yes    Partners: Male    Birth control/protection: Abstinence  Other Topics Concern   Not on file  Social History Narrative   Grandmother is irish/indian   2 half-sisters   2 sisters (full)   1/2 brother   Older brother same mom- brother has CP   3 1/2 younger brothers after me   1 daughter    Boyfriend   Works as a Futures trader    Completed associate degree in Theatre manager   No pets   Enjoys Clinical cytogeneticist   Social Determinants of Health   Financial Resource Strain: Low Risk  (08/03/2022)   Overall Financial Resource Strain (CARDIA)    Difficulty of Paying Living Expenses: Not hard at all  Food Insecurity: No Food Insecurity (08/03/2022)   Hunger Vital Sign    Worried About Running Out of Food in the Last Year: Never true    Ran Out of Food in the Last Year: Never true  Transportation Needs: No Transportation Needs (08/03/2022)   PRAPARE - Administrator, Civil Service (Medical): No    Lack of Transportation (Non-Medical): No  Physical Activity: Insufficiently Active (08/03/2022)   Exercise Vital Sign    Days of Exercise per Week: 2 days    Minutes of Exercise per Session: 20 min  Stress: No Stress Concern Present (08/03/2022)   [employer] of Occupational Health - Occupational Stress Questionnaire    Feeling of Stress : Only a little  Social Connections: Socially Isolated (08/03/2022)   Social Connection and Isolation Panel [NHANES]    Frequency of Communication with Friends and Family: Once a week    Frequency of Social Gatherings with Friends and Family: Once a week    Attends Religious Services: Never    Database administrator or Organizations: No    Attends Engineer, structural: Not on file    Marital Status: Never married  Intimate Partner Violence: Not At Risk (07/10/2022)   Humiliation, Afraid, Rape, and Kick questionnaire    Fear of Current or Ex-Partner: No    Emotionally Abused: No    Physically Abused: No    Sexually Abused: No    Outpatient Medications Prior to Visit  Medication Sig Dispense Refill   albuterol (VENTOLIN HFA) 108 (90 Base) MCG/ACT inhaler Inhale 2 puffs into the lungs every 6 (six) hours as needed for wheezing or shortness of breath. 18 g 2   amLODipine (NORVASC) 10 MG tablet Take 1 tablet (10 mg total) by mouth daily. 30 tablet 0   docusate sodium  (COLACE) 100 MG capsule Take 1 capsule (100 mg total) by mouth 2 (two) times daily. 10 capsule 0   EPINEPHrine 0.3 mg/0.3 mL IJ SOAJ injection Inject 0.3 mg into the muscle as needed for anaphylaxis. 2 each 0   Fiber Adult Gummies 2 g CHEW Take a directed on bottle     Multiple Vitamins-Minerals (MULTIVITAMIN WITH MINERALS) tablet Take 1 tablet by mouth daily.     ondansetron (ZOFRAN-ODT) 4 MG disintegrating tablet Take 1 tablet (4 mg total) by mouth every 8 (eight) hours as needed for nausea or vomiting. 20 tablet  0   oxyCODONE (ROXICODONE) 5 MG immediate release tablet Take 1 tablet (5 mg total) by mouth every 4 (four) hours as needed for severe pain. 10 tablet 0   polyethylene glycol (MIRALAX / GLYCOLAX) 17 g packet Take 17 g by mouth 2 (two) times daily. 14 each 0   valsartan-hydrochlorothiazide (DIOVAN-HCT) 320-25 MG tablet Take 1 tablet by mouth daily. 30 tablet 0   tirzepatide (ZEPBOUND) 7.5 MG/0.5ML Pen Inject 7.5 mg into the skin once a week. (Patient not taking: Reported on 08/04/2022) 2 mL 3   No facility-administered medications prior to visit.    Allergies  Allergen Reactions   Latex Other (See Comments)    Irritated and burning skin   Other Swelling and Other (See Comments)    Allergic to all fruit and raw tomatoes reaction face swells    ROS     See HPI   Physical Exam Constitutional:      Appearance: She is well-developed.  Cardiovascular:     Rate and Rhythm: Normal rate and regular rhythm.     Heart sounds: Normal heart sounds. No murmur heard. Pulmonary:     Effort: Pulmonary effort is normal. No respiratory distress.     Breath sounds: Normal breath sounds. No wheezing.  Musculoskeletal:     Right lower leg: 1+ Edema present.     Left lower leg: 1+ Edema present.  Psychiatric:        Behavior: Behavior normal.        Thought Content: Thought content normal.        Judgment: Judgment normal.      BP (!) 157/102   Pulse 87   Temp 99.2 F (37.3 C)  (Oral)   Resp 18   Ht 5\' 1"  (1.549 m)   Wt (!) 330 lb (149.7 kg)   LMP 06/15/2022 Comment: neg upreg in er 07/06/2022  SpO2 99%   BMI 62.35 kg/m  Wt Readings from Last 3 Encounters:  08/04/22 (!) 330 lb (149.7 kg)  07/10/22 (!) 319 lb 14.2 oz (145.1 kg)  07/06/22 (!) 320 lb (145.2 kg)       Assessment & Plan:   Problem List Items Addressed This Visit       Unprioritized   UTI (urinary tract infection)    Clinically resolved.        Proteinuria    Plan to order 24 urine protein when she returns in 2 weeks.      OSA (obstructive sleep apnea)    Reports non-compliance with cpap.  I told her that this is likely contributing to her uncontrolled bp and fatigue. Reinforced compliance and will refer back to sleep specialist.       Relevant Orders   Ambulatory referral to Pulmonology   Morbid obesity (HCC)    BMI 62.   Patient reports weight gain and difficulty losing weight. She has tried various diets and exercises with limited success. She reports fatigue and shortness of breath with exertion such as walking up the stairs.  She attributes this to her weight.  -[facility] for [facility] management support.      Relevant Orders   Amb Ref to Medical Weight Management   Iron deficiency    Not currently on iron supplementation, will restart iron 325mg  one tablet by mouth every other day.       Hyperglycemia   Relevant Orders   HgB A1c   History of tobacco abuse    The  patient quit smoking and I commended her on this.       Essential hypertension - Primary    Elevated blood pressure readings in the clinic and during recent hospital admission. Currently on Valsartan/Hydrochlorothiazide and Amlodipine. -Add Carvedilol starting at a low dose. -Check blood pressure in 2 weeks.      Relevant Medications   carvedilol (COREG) 3.125 MG tablet   Other Relevant Orders   Comp Met (CMET)   B12 deficiency    Low normal B12  levels. -Start over-the-counter oral B12 supplement at 1000 mcg daily.      Anemia    Likely related to iron deficiency.  Will order IFOB when she returns in 2 weeks.       Relevant Medications   cyanocobalamin (VITAMIN B12) 1000 MCG tablet   Iron, Ferrous Sulfate, 325 (65 Fe) MG TABS   Other Relevant Orders   CBC w/Diff   Other Visit Diagnoses     Vitamin D deficiency       Relevant Orders   Vitamin D (25 hydroxy)      45 minutes spent on today's visit. Time was spent reviewing hospital record, counseling patient on weight loss/OSA, and coordinating medical care.   I have discontinued Donell Ek "Denise"'s Zepbound. I am also having her start on cyanocobalamin, carvedilol, and Iron (Ferrous Sulfate). Additionally, I am having her maintain her multivitamin with minerals, EPINEPHrine, albuterol, ondansetron, oxyCODONE, amLODipine, valsartan-hydrochlorothiazide, polyethylene glycol, docusate sodium, and Fiber Adult Gummies.  Meds ordered this encounter  Medications   cyanocobalamin (VITAMIN B12) 1000 MCG tablet    Sig: Take 1 tablet (1,000 mcg total) by mouth daily.    Order Specific Question:   Supervising Provider    Answer:   Danise Edge A [[id number]]   carvedilol (COREG) 3.125 MG tablet    Sig: Take 1 tablet (3.125 mg total) by mouth 2 (two) times daily with a meal.    Dispense:  60 tablet    Refill:  3    Order Specific Question:   Supervising Provider    Answer:   Danise Edge A [[id number]]   Iron, Ferrous Sulfate, 325 (65 Fe) MG TABS    Sig: Take 325 mg by mouth every other day.    Dispense:  30 tablet    Order Specific Question:   Supervising Provider    Answer:   Danise Edge A [[id number]]

## 2022-08-04 NOTE — Assessment & Plan Note (Signed)
Not currently on iron supplementation, will restart iron 325mg  one tablet by mouth every other day.

## 2022-08-04 NOTE — Assessment & Plan Note (Signed)
Plan to order 24 urine protein when she returns in 2 weeks.

## 2022-08-04 NOTE — Assessment & Plan Note (Signed)
Reports non-compliance with cpap.  I told her that this is likely contributing to her uncontrolled bp and fatigue. Reinforced compliance and will refer back to sleep specialist.

## 2022-08-04 NOTE — Assessment & Plan Note (Signed)
Low normal B12 levels. -Start over-the-counter oral B12 supplement at 1000 mcg daily.

## 2022-08-04 NOTE — Assessment & Plan Note (Signed)
Elevated blood pressure readings in the clinic and during recent hospital admission. Currently on Valsartan/Hydrochlorothiazide and Amlodipine. -Add Carvedilol starting at a low dose. -Check blood pressure in 2 weeks.

## 2022-08-04 NOTE — Assessment & Plan Note (Signed)
BMI 62.   Patient reports weight gain and difficulty losing weight. She has tried various diets and exercises with limited success. She reports fatigue and shortness of breath with exertion such as walking up the stairs.  She attributes this to her weight.  -[facility]erral to [facility] for comprehensive weight management support.

## 2022-08-04 NOTE — Assessment & Plan Note (Signed)
Likely related to iron deficiency.  Will order IFOB when she returns in 2 weeks.

## 2022-08-04 NOTE — Assessment & Plan Note (Signed)
The patient quit smoking and I commended her on this.

## 2022-08-04 NOTE — Telephone Encounter (Signed)
See my chart message

## 2022-08-05 ENCOUNTER — Telehealth: Payer: Self-pay | Admitting: Family

## 2022-08-05 DIAGNOSIS — E559 Vitamin D deficiency, unspecified: Secondary | ICD-10-CM | POA: Insufficient documentation

## 2022-08-05 DIAGNOSIS — D509 Iron deficiency anemia, unspecified: Secondary | ICD-10-CM

## 2022-08-05 DIAGNOSIS — E538 Deficiency of other specified B group vitamins: Secondary | ICD-10-CM

## 2022-08-05 LAB — CBC WITH DIFFERENTIAL/PLATELET
Basophils Absolute: 0.1 10*3/uL (ref 0.0–0.1)
Basophils Relative: 0.9 % (ref 0.0–3.0)
Eosinophils Absolute: 0.1 10*3/uL (ref 0.0–0.7)
Eosinophils Relative: 1.4 % (ref 0.0–5.0)
HCT: 32.8 % — ABNORMAL LOW (ref 36.0–46.0)
Hemoglobin: 10.1 g/dL — ABNORMAL LOW (ref 12.0–15.0)
Lymphocytes Relative: 17.5 % (ref 12.0–46.0)
Lymphs Abs: 1.7 10*3/uL (ref 0.7–4.0)
MCHC: 30.7 g/dL (ref 30.0–36.0)
MCV: 71.5 fl — ABNORMAL LOW (ref 78.0–100.0)
Monocytes Absolute: 0.7 10*3/uL (ref 0.1–1.0)
Monocytes Relative: 7.9 % (ref 3.0–12.0)
Neutro Abs: 6.8 10*3/uL (ref 1.4–7.7)
Neutrophils Relative %: 72.3 % (ref 43.0–77.0)
Platelets: 595 10*3/uL — ABNORMAL HIGH (ref 150.0–400.0)
RBC: 4.59 Mil/uL (ref 3.87–5.11)
RDW: 24.4 % — ABNORMAL HIGH (ref 11.5–15.5)
WBC: 9.5 10*3/uL (ref 4.0–10.5)

## 2022-08-05 LAB — COMPREHENSIVE METABOLIC PANEL
ALT: 14 U/L (ref 0–35)
AST: 12 U/L (ref 0–37)
Albumin: 3.8 g/dL (ref 3.5–5.2)
Alkaline Phosphatase: 73 U/L (ref 39–117)
BUN: 18 mg/dL (ref 6–23)
CO2: 23 mEq/L (ref 19–32)
Calcium: 8.8 mg/dL (ref 8.4–10.5)
Chloride: 105 mEq/L (ref 96–112)
Creatinine, Ser: 1.04 mg/dL (ref 0.40–1.20)
GFR: 67.04 mL/min (ref >60.00)
Glucose, Bld: 99 mg/dL (ref 70–99)
Potassium: 4.2 mEq/L (ref 3.5–5.1)
Sodium: 138 mEq/L (ref 135–145)
Total Bilirubin: 0.3 mg/dL (ref 0.2–1.2)
Total Protein: 7.2 g/dL (ref 6.0–8.3)

## 2022-08-05 LAB — VITAMIN D 25 HYDROXY (VIT D DEFICIENCY, FRACTURES): VITD: 12.32 ng/mL — ABNORMAL LOW (ref 30.00–100.00)

## 2022-08-05 LAB — HEMOGLOBIN A1C: Hgb A1c MFr Bld: 6.2 % (ref 4.6–6.5)

## 2022-08-05 MED ORDER — VITAMIN D (ERGOCALCIFEROL) 1.25 MG (50000 UNIT) PO CAPS
50000.0000 [IU] | ORAL_CAPSULE | ORAL | 0 refills | Status: AC
Start: 2022-08-05 — End: ?

## 2022-08-05 NOTE — Telephone Encounter (Signed)
Vit D was very low.  Advise patient to begin vit D 50000 units once weekly for 12 weeks, then repeat vit D level (dx Vit D deficiency).    B12 is borderline low. Recommend that she start b12 PO once daily. Available otc.   Repeat b12 and vit D in 12 weeks.   She is anemic.  I meant to add an IFOB to her labs yesterday. Can you please arrange IFOB kit for her to pick up and complete. Dx anemia.

## 2022-08-05 NOTE — Telephone Encounter (Signed)
Patient notified of results and provider's recommendations. She will come by the office to get IFOB kit. She will p/up rx for vitamin D and get otc B12. She was scheduled to return in 3 months for D and B12 check.

## 2022-08-19 ENCOUNTER — Other Ambulatory Visit (INDEPENDENT_AMBULATORY_CARE_PROVIDER_SITE_OTHER): Payer: No Typology Code available for payment source

## 2022-08-19 DIAGNOSIS — D509 Iron deficiency anemia, unspecified: Secondary | ICD-10-CM | POA: Diagnosis not present

## 2022-08-20 ENCOUNTER — Telehealth: Payer: Self-pay | Admitting: Family

## 2022-08-20 ENCOUNTER — Telehealth: Payer: Self-pay

## 2022-08-20 DIAGNOSIS — R195 Other fecal abnormalities: Secondary | ICD-10-CM | POA: Insufficient documentation

## 2022-08-20 LAB — FECAL OCCULT BLOOD, IMMUNOCHEMICAL: Fecal Occult Bld: POSITIVE — AB

## 2022-08-20 NOTE — Telephone Encounter (Signed)
Stool positive for blood. I would like her to see GI.

## 2022-08-20 NOTE — Telephone Encounter (Signed)
CRITICAL VALUE STICKER  CRITICAL VALUE: Positive I-FOB  RECEIVER (on-site recipient of call): Hendricks Limes (representative from lab): Jacki Cones   MD NOTIFIED: Dr. Abner Greenspan

## 2022-08-21 ENCOUNTER — Encounter: Payer: Self-pay | Admitting: Family

## 2022-08-21 NOTE — Telephone Encounter (Signed)
Lesly Rubenstein spoke w/ Pt earlier (see other telephone note).

## 2022-08-21 NOTE — Telephone Encounter (Signed)
Patient made aware of results/recommendations.  She is okay with GI referral.

## 2022-08-21 NOTE — Telephone Encounter (Signed)
See telephone notes, Lesly Rubenstein already spoke w/ Pt.

## 2022-08-24 NOTE — Telephone Encounter (Signed)
Can you please review Dr. Mariel Aloe recommendations with patient as well as arrange a follow up cbc?

## 2022-08-25 ENCOUNTER — Encounter: Payer: Self-pay | Admitting: Gastroenterology

## 2022-08-26 NOTE — Telephone Encounter (Signed)
Spoke to patient. She had questions about the IFOB and potential GI work up. We discussed various cause of positive IFOB. She is encouraged to follow through with her scheduled appointment. Pt verbalized understanding.

## 2022-09-08 HISTORY — PX: COLONOSCOPY WITH ESOPHAGOGASTRODUODENOSCOPY (EGD): SHX5779

## 2022-09-17 ENCOUNTER — Encounter: Payer: Self-pay | Admitting: Internal Medicine

## 2022-09-17 ENCOUNTER — Ambulatory Visit (INDEPENDENT_AMBULATORY_CARE_PROVIDER_SITE_OTHER): Payer: No Typology Code available for payment source | Admitting: Internal Medicine

## 2022-09-17 VITALS — BP 132/76 | HR 86 | Temp 98.1°F | Resp 20

## 2022-09-17 DIAGNOSIS — J302 Other seasonal allergic rhinitis: Secondary | ICD-10-CM | POA: Insufficient documentation

## 2022-09-17 DIAGNOSIS — T781XXD Other adverse food reactions, not elsewhere classified, subsequent encounter: Secondary | ICD-10-CM | POA: Diagnosis not present

## 2022-09-17 DIAGNOSIS — J3089 Other allergic rhinitis: Secondary | ICD-10-CM

## 2022-09-17 DIAGNOSIS — J4520 Mild intermittent asthma, uncomplicated: Secondary | ICD-10-CM | POA: Insufficient documentation

## 2022-09-17 DIAGNOSIS — H1013 Acute atopic conjunctivitis, bilateral: Secondary | ICD-10-CM | POA: Insufficient documentation

## 2022-09-17 DIAGNOSIS — Z91040 Latex allergy status: Secondary | ICD-10-CM | POA: Diagnosis not present

## 2022-09-17 MED ORDER — AIRSUPRA 90-80 MCG/ACT IN AERO: 2.0000 | INHALATION_SPRAY | RESPIRATORY_TRACT | 2 refills | Status: DC | PRN

## 2022-09-17 NOTE — Progress Notes (Signed)
FOLLOW UP Date of Service/Encounter:  09/17/22  Subjective:  Cristina Adkins (DOB: Jan 06, 1981) is a 42 y.o. female [facility] in re-evaluation of the following: exercise induced bronchospasm, allergic rhinitis, latex contact dermatitis, and pollen food allergy syndrome History obtained from: chart review and patient.  For Review, LV was on 03/19/22  with Dr.Darci Lykins seen for  allergy testing (see below) . See below for summary of history and diagnostics.  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 Chronic Rhinitis:  Started in childhood,   dyspnea with grass exposure , nasal congestion, rhinorrhea, post nasal drainage, sneezing, watery eyes, itchy eyes, and itchy nose  Occurs seasonally-springtime  - SPT environmental panel (03/19/2022): Positive to grass pollen, weed pollen, tree pollen - serum testing 03/04/22: low positive to dust mite, dog, grass, tree pollen, weed pollen.   Reflux:  On pantoprazole with partial response Food Allergy: suspected PFAS fruits (banannas, kiwi, apple, blueberry, strawberry, pineapple, banana, grape) tomato, avocado,  History of reaction: throat itching, ear itching.  Tomatos, bananas and pineapples cause lip swelling which she treats with benadryl.  Bannana nut bread causes tongue itching.  - SPT select foods (03/19/22): Negative to tomato, great, banana, apple, strawberry, pineapple - serum testing 03/04/22: negative banana, great, apple, peach, pear, strawberry, blueberry, raspberry, kiwi, grapefruit 0.13, lemon 0.21.  negative plum. Latex: ear irritation with latex containing ear pieces. She also had burning sensation with latex condoms  --------------------------------------------------- Today presents for follow-up. She is using her rescue inhaler frequently over the last few  weeks. She is having a hard time breathing, shortness of breath but feels this is due to low hemoglobin. Her hemoglobin has dropped from 12-9.5 over the summer. She does feel relief when using the albuterol. Using it mostly in the setting of exercise. If she goes outside and takes the dogs out, she will get short of breath.  Sometimes even just talking makes her feel short of breath and fatigue.  She has chronic fatigue.  She is in the process of being evaluated for possible causes for her low hemoglobin.  She had a positive colon screen, and she did have a colonoscopy and EGD which were normal. She thought it was cancer initially, but no cause has been found for suspected blood loss.  She denies significant bleeding during menstruation.  She is being referred to hematology, but does not have this appointment scheduled as of yet. She is currently taking iron pills and vitamin B12 as well as vitamin D.   Her allergies have been okay lately. She is not on any allergy medications at this time.   She continues to avoid all raw fruits that have bothered her in the past.  Chart Review: On 09/08/2022 was evaluated by GI for rectal bleeding.  EGD and colonoscopy was recommended.  All medications reviewed by clinical staff and updated in chart. No new pertinent medical or surgical history except as noted in HPI.  ROS: All others negative except as noted per HPI.   Objective:  BP 132/76   Pulse 86   Temp 98.1 F (36.7 C) (Temporal)   Resp 20   SpO2 98%  There is no height or weight on file to calculate BMI. Physical Exam: General Appearance:  Alert, cooperative, no distress, appears stated age  Head:  Normocephalic, without obvious abnormality, atraumatic  Eyes:  Conjunctiva clear, EOM's intact  Ears EACs normal bilaterally and normal TMs bilaterally  Nose: Nares normal, hypertrophic turbinates, normal mucosa, and no visible anterior polyps  Throat: Lips, tongue normal; teeth and gums normal,  normal posterior oropharynx  Neck: Supple, symmetrical  Lungs:   clear to auscultation bilaterally, Respirations unlabored, no coughing  Heart:  regular rate and rhythm and no murmur, Appears well perfused  Extremities: No edema  Skin: Skin color, texture, turgor normal and no rashes or lesions on visualized portions of skin  Neurologic: No gross deficits   Labs:  Lab Orders  No laboratory test(s) ordered today    Spirometry:  Tracings reviewed. Her effort: Good reproducible efforts. FVC: 2.22L FEV1: 1.92L, 71% predicted FEV1/FVC ratio: 0.86 Interpretation: Nonobstructive ratio, low FEV1.  Please see scanned spirometry results for details.  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 - These symptoms are not life-threatening and are because of a cross reaction between a pollen or latex you are allergic to, and to a protein in specific foods (such as fresh fruits, vegetables, and nuts). - If you can eat these things it is fine to continue to do so.  If not, you may avoid these fresh fruits.  Heating these foods should allow them to be consumed without symptoms. - You may notice increase in symptoms during allergy season, this is to be expected. - Allergy  Immunotherapy can help lessen and some cases cure these symptoms and should be considered if they worsen.   Chronic Rhinitis Seasonal Allergic: - allergy testing:  (03/19/2022): Positive to grass pollen, weed pollen, tree pollen  - Prevention:  - allergen avoidance when possible - consider allergy shots as long term control of your symptoms by teaching your immune system to be more tolerant of your allergy triggers  - Symptom control: if symptoms bothersome - Start Nasal Steroid Spray: Best results if used daily. - Options include Flonase (fluticasone),  Nasocort (triamcinolone), Nasonex (mometasome) 1- 2 sprays in each nostril daily.  - All can be purchased over-the-counter if not covered by insurance. - Start Antihistamine: daily or daily as needed.   -Options include Zyrtec (Cetirizine) 10mg , Claritin (Loratadine) 10mg , Allegra (Fexofenadine) 180mg , or Xyzal (Levocetirinze) 5mg  - Can be purchased over-the-counter if not covered by insurance.  Allergic Conjunctivitis:  - Consider Allergy Eye drops-great options include Pataday (Olopatadine) or Zaditor (ketotifen) for eye symptoms daily as needed-both sold over the counter if not covered by insurance. and Rewetting Drops such as Systane,TheraTears, etc  -Avoid eye drops that say red eye relief as they may contain medications that dry out your eyes.    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  Follow up: 6 months, sooner if needed.   Thank you so much for letting me partake in your care today.  Don't hesitate to reach out if you have any additional concerns!  Other: Samples provided of Shawn Route, MD  [facility]

## 2022-09-17 NOTE — Patient Instructions (Addendum)
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 - These symptoms are not life-threatening and are because of a cross reaction between a pollen or latex you are allergic to, and to a protein in specific foods (such as fresh fruits, vegetables, and nuts). - If you can eat these things it is fine to continue to do so.  If not, you may avoid these fresh fruits.  Heating these foods should allow them to be consumed without symptoms. - You may notice increase in symptoms during allergy season, this is to be expected. - Allergy  Immunotherapy can help lessen and some cases cure these symptoms and should be considered if they worsen.   Chronic Rhinitis Seasonal Allergic: - allergy testing:  (03/19/2022): Positive to grass pollen, weed pollen, tree pollen  - Prevention:  - allergen avoidance when possible - consider allergy shots as long term control of your symptoms by teaching your immune system to be more tolerant of your allergy triggers  - Symptom control: if symptoms bothersome - Start Nasal Steroid Spray: Best results if used daily. - Options include Flonase (fluticasone), Nasocort (triamcinolone), Nasonex (mometasome) 1- 2 sprays in each nostril daily.  - All can be purchased over-the-counter if not covered by insurance. - Start Antihistamine: daily or daily as needed.   -Options include Zyrtec (Cetirizine) 10mg , Claritin (Loratadine) 10mg , Allegra (Fexofenadine) 180mg , or Xyzal (Levocetirinze) 5mg  - Can be purchased over-the-counter if not covered by insurance.  Allergic Conjunctivitis:  - Consider Allergy Eye drops-great options include Pataday (Olopatadine) or Zaditor (ketotifen) for eye symptoms daily as needed-both sold over the counter if not covered by insurance. and Rewetting Drops such as Systane,TheraTears, etc  -Avoid eye drops that say  red eye relief as they may contain medications that dry out your eyes.    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  Follow up: 6 months, sooner if needed.   Thank you so much for letting me partake in your care today.  Don't hesitate to reach out if you have any additional concerns!  Tonny Bollman, MD [facility]

## 2022-10-15 ENCOUNTER — Ambulatory Visit: Payer: No Typology Code available for payment source | Admitting: Nurse Practitioner

## 2022-10-15 VITALS — BP 138/89 | HR 76 | Temp 98.6°F | Ht 61.0 in | Wt 330.0 lb

## 2022-10-15 DIAGNOSIS — E559 Vitamin D deficiency, unspecified: Secondary | ICD-10-CM

## 2022-10-15 DIAGNOSIS — I10 Essential (primary) hypertension: Secondary | ICD-10-CM | POA: Diagnosis not present

## 2022-10-15 DIAGNOSIS — I517 Cardiomegaly: Secondary | ICD-10-CM

## 2022-10-15 DIAGNOSIS — D509 Iron deficiency anemia, unspecified: Secondary | ICD-10-CM

## 2022-10-15 DIAGNOSIS — G4733 Obstructive sleep apnea (adult) (pediatric): Secondary | ICD-10-CM

## 2022-10-15 DIAGNOSIS — Z0289 Encounter for other administrative examinations: Secondary | ICD-10-CM

## 2022-10-15 DIAGNOSIS — Z6841 Body Mass Index (BMI) 40.0 and over, adult: Secondary | ICD-10-CM

## 2022-10-15 DIAGNOSIS — E538 Deficiency of other specified B group vitamins: Secondary | ICD-10-CM

## 2022-10-15 NOTE — Progress Notes (Signed)
Office: [phone]  /  Fax: [phone]   Initial Visit  Cristina Adkins was seen in clinic today to evaluate for obesity. She is interested in losing weight to improve overall health and reduce the risk of weight related complications. She presents today to review program treatment options, initial physical assessment, and evaluation.     She was referred by: PCP  When asked what else they would like to accomplish? She states: Improve energy levels and physical activity, Improve quality of life, and Lose a target amount of weight : 130 lbs  Weight history:  She started gaining weight after she had her daughter in 2004 and after she got out of the [employer] in 2005. Her weight has fluctuated over the years.    When asked how has your weight affected you? She states: Contributed to orthopedic problems or mobility issues, Having fatigue, and Having poor endurance  Some associated conditions: HTN, cardiomegaly, asthma, OSAS on CPAP, allergies, anemia, B12 def, anemia, back pain, knee pain, Vit D def  Contributing factors: Medications, Life event, and Pregnancy  Weight promoting medications identified: Beta-blockers and Steroids  Current nutrition plan: None-she has tried multiple diets over the years.  Saw RD last on 05/21/21  Current level of physical activity: None  Current or previous pharmacotherapy: GLP-1-took Mounjaro for 4 months-stopped due to cost  Response to medication: Lost weight initially but was unable to sustain weight loss   Past medical history includes:   Past Medical History:  Diagnosis Date   History of alcohol abuse    Hypertension    Hypokalemia    Iron deficiency      Objective:   BP 138/89   Pulse 76   Temp 98.6 F (37 C)   Ht 5\' 1"  (1.549 m)   Wt (!) 330 lb (149.7 kg)   SpO2 97%   BMI 62.35 kg/m  She was weighed on the bioimpedance scale: Body mass index is 62.35 kg/m.  Peak Weight:330 lbs , Body Fat%:60.2%, Visceral Fat Rating:26, Weight  trend over the last 12 months: Increasing  General:  Alert, oriented and cooperative. Patient is in no acute distress.  Respiratory: Normal respiratory effort, no problems with respiration noted   Gait: able to ambulate independently  Mental Status: Normal mood and affect. Normal behavior. Normal judgment and thought content.   DIAGNOSTIC DATA REVIEWED:  BMET    Component Value Date/Time   NA 138 08/04/2022 1515   K 4.2 08/04/2022 1515   CL 105 08/04/2022 1515   CO2 23 08/04/2022 1515   GLUCOSE 99 08/04/2022 1515   BUN 18 08/04/2022 1515   CREATININE 1.04 08/04/2022 1515   CREATININE 0.95 11/22/2019 1216   CALCIUM 8.8 08/04/2022 1515   GFRNONAA >60 07/14/2022 0600   GFRAA >60 01/02/2019 1303   Lab Results  Component Value Date   HGBA1C 6.2 08/04/2022   HGBA1C 5.6 07/11/2014   No results found for: "INSULIN" CBC    Component Value Date/Time   WBC 9.5 08/04/2022 1515   RBC 4.59 08/04/2022 1515   HGB 10.1 (L) 08/04/2022 1515   HCT 32.8 (L) 08/04/2022 1515   PLT 595.0 (H) 08/04/2022 1515   MCV 71.5 (L) 08/04/2022 1515   MCH 22.1 (L) 07/14/2022 0600   MCHC 30.7 08/04/2022 1515   RDW 24.4 (H) 08/04/2022 1515   Iron/TIBC/Ferritin/ %Sat    Component Value Date/Time   IRON 19 (L) 07/11/2022 0423   IRON 35 12/03/2020 1140   TIBC 277 07/11/2022 0423  TIBC 327 12/03/2020 1140   FERRITIN 60 07/11/2022 0423   IRONPCTSAT 7 (L) 07/11/2022 0423   IRONPCTSAT 12 (L) 08/12/2021 1705   Lipid Panel     Component Value Date/Time   CHOL 69 04/05/2019 0810   TRIG 138.0 04/05/2019 0810   HDL 31.30 (L) 04/05/2019 0810   CHOLHDL 2 04/05/2019 0810   VLDL 27.6 04/05/2019 0810   LDLCALC 10 04/05/2019 0810   Hepatic Function Panel     Component Value Date/Time   PROT 7.2 08/04/2022 1515   ALBUMIN 3.8 08/04/2022 1515   AST 12 08/04/2022 1515   ALT 14 08/04/2022 1515   ALKPHOS 73 08/04/2022 1515   BILITOT 0.3 08/04/2022 1515   BILIDIR 0.1 04/05/2019 0810      Component  Value Date/Time   TSH 2.29 04/05/2019 0810     Assessment and Plan:   Essential hypertension/cardiomegaly  Continue to follow-up with PCP.  Continue medications as directed.  OSA (obstructive sleep apnea) Not currently using CPAP.  Will rediscuss at follow-up visits.  Vitamin D deficiency Continue to follow-up with PCP  B12 deficiency Continue to follow-up with PCP  Iron deficiency anemia, unspecified iron deficiency anemia type Continue to follow-up with PCP and hematology  Morbid obesity (HCC)  BMI 60.0-69.9, adult (HCC)        Obesity Treatment / Action Plan:  Patient will work on garnering support from family and friends to begin weight loss journey. Will work on eliminating or reducing the presence of highly palatable, calorie dense foods in the home. Will complete provided nutritional and psychosocial assessment questionnaire before the next appointment. Will be scheduled for indirect calorimetry to determine resting energy expenditure in a fasting state.  This will allow [place] to create a reduced calorie, high-protein meal plan to promote loss of fat mass while preserving muscle mass. Counseled on the health benefits of losing 5%-15% of total body weight. Was counseled on nutritional approaches to weight loss and benefits of reducing processed foods and consuming plant-based foods and high quality protein as part of nutritional weight management. Was counseled on pharmacotherapy and role as an adjunct in weight management.   Obesity Education Performed Today:  She was weighed on the bioimpedance scale and results were discussed and documented in the synopsis.  We discussed obesity as a disease and the importance of a more detailed evaluation of all the factors contributing to the disease.  We discussed the importance of long term lifestyle changes which include nutrition, exercise and behavioral modifications as well as the importance of customizing this to her  specific health and social needs.  We discussed the benefits of reaching a healthier weight to alleviate the symptoms of existing conditions and reduce the risks of the biomechanical, metabolic and psychological effects of obesity.  Cristina Adkins appears to be in the action stage of change and states they are ready to start intensive lifestyle modifications and behavioral modifications.  30 minutes was spent today on this visit including the above counseling, pre-visit chart review, and post-visit documentation.  Reviewed by clinician on day of visit: allergies, medications, problem list, medical history, surgical history, family history, social history, and previous encounter notes pertinent to obesity diagnosis.    Theodis Sato Marquinn Meschke FNP-C

## 2022-10-28 ENCOUNTER — Ambulatory Visit: Payer: No Typology Code available for payment source | Admitting: Bariatrics

## 2022-11-05 ENCOUNTER — Encounter: Payer: Self-pay | Admitting: Bariatrics

## 2022-11-05 ENCOUNTER — Ambulatory Visit: Payer: No Typology Code available for payment source | Admitting: Bariatrics

## 2022-11-05 VITALS — BP 131/86 | HR 84 | Temp 98.6°F | Ht 61.0 in | Wt 328.0 lb

## 2022-11-05 DIAGNOSIS — E559 Vitamin D deficiency, unspecified: Secondary | ICD-10-CM

## 2022-11-05 DIAGNOSIS — R0602 Shortness of breath: Secondary | ICD-10-CM | POA: Diagnosis not present

## 2022-11-05 DIAGNOSIS — G4733 Obstructive sleep apnea (adult) (pediatric): Secondary | ICD-10-CM | POA: Diagnosis not present

## 2022-11-05 DIAGNOSIS — Z6841 Body Mass Index (BMI) 40.0 and over, adult: Secondary | ICD-10-CM

## 2022-11-05 DIAGNOSIS — I517 Cardiomegaly: Secondary | ICD-10-CM

## 2022-11-05 DIAGNOSIS — R7309 Other abnormal glucose: Secondary | ICD-10-CM | POA: Insufficient documentation

## 2022-11-05 DIAGNOSIS — E538 Deficiency of other specified B group vitamins: Secondary | ICD-10-CM

## 2022-11-05 DIAGNOSIS — I10 Essential (primary) hypertension: Secondary | ICD-10-CM | POA: Diagnosis not present

## 2022-11-05 DIAGNOSIS — Z1331 Encounter for screening for depression: Secondary | ICD-10-CM

## 2022-11-05 DIAGNOSIS — D509 Iron deficiency anemia, unspecified: Secondary | ICD-10-CM | POA: Diagnosis not present

## 2022-11-05 DIAGNOSIS — R5383 Other fatigue: Secondary | ICD-10-CM | POA: Insufficient documentation

## 2022-11-06 LAB — INSULIN, RANDOM: INSULIN: 41.7 u[IU]/mL — ABNORMAL HIGH (ref 2.6–24.9)

## 2022-11-06 LAB — COMPREHENSIVE METABOLIC PANEL
ALT: 21 [IU]/L (ref 0–32)
AST: 16 [IU]/L (ref 0–40)
Albumin: 3.7 g/dL — ABNORMAL LOW (ref 3.9–4.9)
Alkaline Phosphatase: 85 [IU]/L (ref 44–121)
BUN/Creatinine Ratio: 17 (ref 9–23)
BUN: 18 mg/dL (ref 6–24)
Bilirubin Total: <0.2 mg/dL (ref 0.0–1.2)
CO2: 19 mmol/L — ABNORMAL LOW (ref 20–29)
Calcium: 9.5 mg/dL (ref 8.7–10.2)
Chloride: 103 mmol/L (ref 96–106)
Creatinine, Ser: 1.05 mg/dL — ABNORMAL HIGH (ref 0.57–1.00)
Globulin, Total: 3.5 g/dL (ref 1.5–4.5)
Glucose: 110 mg/dL — ABNORMAL HIGH (ref 70–99)
Potassium: 4.6 mmol/L (ref 3.5–5.2)
Sodium: 139 mmol/L (ref 134–144)
Total Protein: 7.2 g/dL (ref 6.0–8.5)
eGFR: 68 mL/min/{1.73_m2} (ref >59)

## 2022-11-06 LAB — LIPID PANEL WITH LDL/HDL RATIO
Cholesterol, Total: 92 mg/dL — ABNORMAL LOW (ref 100–199)
HDL: 49 mg/dL (ref >39)
LDL Chol Calc (NIH): 24 mg/dL (ref 0–99)
LDL/HDL Ratio: 0.5 {ratio} (ref 0.0–3.2)
Triglycerides: 102 mg/dL (ref 0–149)
VLDL Cholesterol Cal: 19 mg/dL (ref 5–40)

## 2022-11-06 LAB — ANEMIA PANEL
Ferritin: 38 ng/mL (ref 15–150)
Folate, Hemolysate: 381 ng/mL
Folate, RBC: 1016 ng/mL (ref >498)
Hematocrit: 37.5 % (ref 34.0–46.6)
Iron Saturation: 15 % (ref 15–55)
Iron: 57 ug/dL (ref 27–159)
Retic Ct Pct: 2.4 % (ref 0.6–2.6)
Total Iron Binding Capacity: 369 ug/dL (ref 250–450)
UIBC: 312 ug/dL (ref 131–425)
Vitamin B-12: 890 pg/mL (ref 232–1245)

## 2022-11-06 LAB — TSH+T4F+T3FREE
Free T4: 1.12 ng/dL (ref 0.82–1.77)
T3, Free: 3 pg/mL (ref 2.0–4.4)
TSH: 1.97 u[IU]/mL (ref 0.450–4.500)

## 2022-11-06 LAB — VITAMIN D 25 HYDROXY (VIT D DEFICIENCY, FRACTURES): Vit D, 25-Hydroxy: 43.2 ng/mL (ref 30.0–100.0)

## 2022-11-06 LAB — HEMOGLOBIN A1C
Est. average glucose Bld gHb Est-mCnc: 134 mg/dL
Hgb A1c MFr Bld: 6.3 % — ABNORMAL HIGH (ref 4.8–5.6)

## 2022-11-09 ENCOUNTER — Encounter: Payer: Self-pay | Admitting: Bariatrics

## 2022-11-09 DIAGNOSIS — R7303 Prediabetes: Secondary | ICD-10-CM | POA: Insufficient documentation

## 2022-11-09 DIAGNOSIS — E88819 Insulin resistance, unspecified: Secondary | ICD-10-CM | POA: Insufficient documentation

## 2022-11-09 NOTE — Progress Notes (Signed)
Chief Complaint:   OBESITY Cristina Adkins (MR# [id number]) is a 42 y.o. female who presents for evaluation and treatment of obesity and related comorbidities. Current BMI is Body mass index is 61.98 kg/m. Cristina Adkins has been struggling with her weight for many years and has been unsuccessful in either losing weight, maintaining weight loss, or reaching her healthy weight goal.  Cristina Adkins is currently in the action stage of change and ready to dedicate time achieving and maintaining a healthier weight. Cristina Adkins is interested in becoming our patient and working on intensive lifestyle modifications including (but not limited to) diet and exercise for weight loss.  Patient met with Judeth Cornfield, nurse practitioner on 10/15/2022 for an information session.  Cristina Adkins's habits were reviewed today and are as follows: Her family eats meals together, she thinks her family will eat healthier with her, her desired weight loss is 128 lbs, she has been heavy most of her life, she started gaining excessive weight when she moved to [place], her heaviest weight ever was 330 pounds, she skips meals frequently, and she is frequently drinking liquids with calories.  Depression Screen Cristina Adkins's Food and Mood (modified PHQ-9) score was 5.  Subjective:   1. Other fatigue Cristina Adkins admits to daytime somnolence and admits to waking up still tired. Patient has a history of symptoms of daytime fatigue, morning fatigue, and morning headache. Cristina Adkins generally gets 4 hours of sleep per night, and states that she has nightime awakenings. Snoring is present. Apneic episodes are present. Epworth Sleepiness Score is 8.   2. SOB (shortness of breath) on exertion Stormey notes increasing shortness of breath with exercising and seems to be worsening over time with weight gain. She notes getting out of breath sooner with activity than she used to. This has not gotten worse recently. Devlyn denies shortness of breath at rest or  orthopnea.  3. Essential hypertension Patient is taking Norvasc, Coreg, and Diovan.  Her blood pressure is controlled.  4. OSA (obstructive sleep apnea) Patient has a diagnosis of sleep apnea.   5. Iron deficiency anemia, unspecified iron deficiency anemia type Patient has chronic anemia, and she is taking iron supplementation daily.  6. Vitamin D deficiency Patient is taking vitamin D supplementation.  7. B12 deficiency Patient is taking B12 supplementation.  8. Cardiomegaly Patient is taking her medications as directed.  9. Elevated glucose Patient is not on medications currently.  Assessment/Plan:   1. Other fatigue Shivaun does feel that her weight is causing her energy to be lower than it should be. Fatigue may be related to obesity, depression or many other causes. Labs will be ordered, and in the meanwhile, Cristina Adkins will focus on self care including making healthy food choices, increasing physical activity and focusing on stress reduction.  - TSH+T4F+T3Free - Anemia panel - Ferritin  2. SOB (shortness of breath) on exertion Cristina Adkins does feel that she gets out of breath more easily that she used to when she exercises. Cristina Adkins's shortness of breath appears to be obesity related and exercise induced. She has agreed to work on weight loss and gradually increase exercise to treat her exercise induced shortness of breath. Will continue to monitor closely.  3. Essential hypertension We will check labs today, and patient will continue her medications as directed.  - Comprehensive metabolic panel  4. OSA (obstructive sleep apnea) Intensive lifestyle modifications are the first line treatment for this issue. We discussed several lifestyle modifications today and she will continue to work on diet, exercise and  weight loss efforts. We will continue to monitor. Orders and follow up as documented in patient record.   5. Iron deficiency anemia, unspecified iron deficiency anemia  type We will check labs today.  Patient will continue with her iron supplementation.  - Anemia panel - Ferritin  6. Vitamin D deficiency We will check labs today, and patient will continue with her vitamin D supplementation.  - VITAMIN D 25 Hydroxy (Vit-D Deficiency, Fractures) - Vitamin B12  7. B12 deficiency We will check labs today.  Patient will continue with her B12 supplementation.  - Vitamin B12  8. Cardiomegaly We will check labs today.  See medications for hypertension.  - Lipid Panel With LDL/HDL Ratio - Comprehensive metabolic panel  9. Elevated glucose We will check labs today, we will follow-up at patient's next office visit.  - Hemoglobin A1c - Insulin, random  10. Depression screening Cristina Adkins had a positive depression screening. Depression is commonly associated with obesity and often results in emotional eating behaviors. We will monitor this closely and work on CBT to help improve the non-hunger eating patterns. Referral to Psychology may be required if no improvement is seen as she continues in our clinic.  11. Morbid obesity (HCC)  12. BMI 60.0-69.9, adult (HCC) Cristina Adkins is currently in the action stage of change and her goal is to continue with weight loss efforts. I recommend Cristina Adkins begin the structured treatment plan as follows:  She has agreed to the Category 3 Plan.  Meal planning and intentional eating were discussed.  Review labs with the patient from 08/04/2022 CMP, vitamin D, CBC, A1c, and 99.  Exercise goals: No exercise has been prescribed at this time.   Behavioral modification strategies: increasing lean protein intake, decreasing simple carbohydrates, increasing vegetables, increasing water intake, decreasing eating out, no skipping meals, meal planning and cooking strategies, keeping healthy foods in the home, and planning for success.  She was informed of the importance of frequent follow-up visits to maximize her success with intensive  lifestyle modifications for her multiple health conditions. She was informed we would discuss her lab results at her next visit unless there is a critical issue that needs to be addressed sooner. [place] agreed to keep her next visit at the agreed upon time to discuss these results.  Objective:   Blood pressure 131/86, pulse 84, temperature 98.6 F (37 C), height 5\' 1"  (1.549 m), weight (!) 328 lb (148.8 kg), SpO2 98%, unknown if currently breastfeeding. Body mass index is 61.98 kg/m.  EKG: Normal sinus rhythm, rate (unable to obtain).  Indirect Calorimeter completed today shows a VO2 of 346 and a REE of 2390.  Her calculated basal metabolic rate is 1610 thus her basal metabolic rate is better than expected.  General: Cooperative, alert, well developed, in no acute distress. HEENT: Conjunctivae and lids unremarkable. Cardiovascular: Regular rhythm.  Lungs: Normal work of breathing. Neurologic: No focal deficits.   Lab Results  Component Value Date   CREATININE 1.05 (H) 11/05/2022   BUN 18 11/05/2022   NA 139 11/05/2022   K 4.6 11/05/2022   CL 103 11/05/2022   CO2 19 (L) 11/05/2022   Lab Results  Component Value Date   ALT 21 11/05/2022   AST 16 11/05/2022   ALKPHOS 85 11/05/2022   BILITOT <0.2 11/05/2022   Lab Results  Component Value Date   HGBA1C 6.3 (H) 11/05/2022   HGBA1C 6.2 08/04/2022   HGBA1C 6.2 08/12/2021   HGBA1C 5.9 12/17/2020   HGBA1C 5.3 03/30/2017  Lab Results  Component Value Date   INSULIN 41.7 (H) 11/05/2022   Lab Results  Component Value Date   TSH 1.970 11/05/2022   Lab Results  Component Value Date   CHOL 92 (L) 11/05/2022   HDL 49 11/05/2022   LDLCALC 24 11/05/2022   TRIG 102 11/05/2022   CHOLHDL 2 04/05/2019   Lab Results  Component Value Date   WBC 9.5 08/04/2022   HGB 10.1 (L) 08/04/2022   HCT 37.5 11/05/2022   MCV 71.5 (L) 08/04/2022   PLT 595.0 (H) 08/04/2022   Lab Results  Component Value Date   IRON 57 11/05/2022    TIBC 369 11/05/2022   FERRITIN 38 11/05/2022   Attestation Statements:   Reviewed by clinician on day of visit: allergies, medications, problem list, medical history, surgical history, family history, social history, and previous encounter notes.  Time spent on visit including pre-visit chart review and post-visit charting and care was 45 minutes.   Trude Mcburney, am acting as Energy manager for [employer], DO.  I have reviewed the above documentation for accuracy and completeness, and I agree with the above. Corinna Capra, DO

## 2022-11-11 ENCOUNTER — Other Ambulatory Visit: Payer: No Typology Code available for payment source

## 2022-11-11 ENCOUNTER — Ambulatory Visit: Payer: No Typology Code available for payment source | Admitting: Bariatrics

## 2022-11-18 ENCOUNTER — Encounter: Payer: Self-pay | Admitting: Bariatrics

## 2022-11-18 ENCOUNTER — Ambulatory Visit: Payer: No Typology Code available for payment source | Admitting: Bariatrics

## 2022-11-18 VITALS — BP 109/72 | HR 100 | Temp 98.1°F | Ht 61.0 in | Wt 322.0 lb

## 2022-11-18 DIAGNOSIS — I10 Essential (primary) hypertension: Secondary | ICD-10-CM | POA: Diagnosis not present

## 2022-11-18 DIAGNOSIS — E88819 Insulin resistance, unspecified: Secondary | ICD-10-CM

## 2022-11-18 DIAGNOSIS — E559 Vitamin D deficiency, unspecified: Secondary | ICD-10-CM

## 2022-11-18 DIAGNOSIS — R7303 Prediabetes: Secondary | ICD-10-CM

## 2022-11-18 DIAGNOSIS — Z6841 Body Mass Index (BMI) 40.0 and over, adult: Secondary | ICD-10-CM

## 2022-11-19 NOTE — Progress Notes (Signed)
Chief Complaint:   OBESITY Cristina Adkins is here to discuss her progress with her obesity treatment plan along with follow-up of her obesity related diagnoses. Cristina Adkins is on the Category 4 Plan and states she is following her eating plan approximately 90% of the time. Cristina Adkins states she is doing 0 minutes 0 times per week.  Today's visit was #: 2 Starting weight: 328 lbs Starting date: 11/05/2022 Today's weight: 322 lbs Today's date: 11/18/2022 Total lbs lost to date: 6 Total lbs lost since last in-office visit: 6  Interim History: Patient is down 6 lbs since her first visit.   Subjective:   1. Prediabetes Patient's recent A1c was 6.3. She is not on medications.   2. Insulin resistance Patient's recent insulin level was 41.7. She is not on medications.   3. Essential hypertension Patient's blood pressure is controlled today. She is taking Coreg and Diovan-HCT.   4. Vitamin D deficiency Patient's recent Vitamin D level was 43.2.  Assessment/Plan:   1. Prediabetes Patient will break off her coffee, and she will eat her breakfast. Journaling numbers were given.   2. Insulin resistance Handouts on insulin resistance and pre-diabetes were given today.   3. Essential hypertension Patient will continue her medications as directed.   4. Vitamin D deficiency Patient will begin OTC Vitamin D 2,000 IU daily. We will recheck her level in 3 months.   5. Morbid obesity (HCC)  6. BMI 60.0-69.9, adult (HCC) Tran is currently in the action stage of change. As such, her goal is to continue with weight loss efforts. She has agreed to the Category 4 Plan.   Patient is to keep her protein and water intake high. She will adhere closely to the meal plan. She will continue meal planning.  Reviewed labs with the patient from 11/05/2022; CMP, cholesterol, iron, anemia profile, vitamin D, B12, HCT, A1c, insulin, and thyroid panel.  Exercise goals: No exercise has been prescribed at  this time.  Behavioral modification strategies: increasing lean protein intake, decreasing simple carbohydrates, increasing vegetables, increasing water intake, decreasing eating out, no skipping meals, meal planning and cooking strategies, keeping healthy foods in the home, and planning for success.  Delayza has agreed to follow-up with our clinic in 2 weeks. She was informed of the importance of frequent follow-up visits to maximize her success with intensive lifestyle modifications for her multiple health conditions.   Objective:   Blood pressure 109/72, pulse 100, temperature 98.1 F (36.7 C), height 5\' 1"  (1.549 m), weight (!) 322 lb (146.1 kg), SpO2 98%, unknown if currently breastfeeding. Body mass index is 60.84 kg/m.  General: Cooperative, alert, well developed, in no acute distress. HEENT: Conjunctivae and lids unremarkable. Cardiovascular: Regular rhythm.  Lungs: Normal work of breathing. Neurologic: No focal deficits.   Lab Results  Component Value Date   CREATININE 1.05 (H) 11/05/2022   BUN 18 11/05/2022   NA 139 11/05/2022   K 4.6 11/05/2022   CL 103 11/05/2022   CO2 19 (L) 11/05/2022   Lab Results  Component Value Date   ALT 21 11/05/2022   AST 16 11/05/2022   ALKPHOS 85 11/05/2022   BILITOT <0.2 11/05/2022   Lab Results  Component Value Date   HGBA1C 6.3 (H) 11/05/2022   HGBA1C 6.2 08/04/2022   HGBA1C 6.2 08/12/2021   HGBA1C 5.9 12/17/2020   HGBA1C 5.3 03/30/2017   Lab Results  Component Value Date   INSULIN 41.7 (H) 11/05/2022   Lab Results  Component Value Date  TSH 1.970 11/05/2022   Lab Results  Component Value Date   CHOL 92 (L) 11/05/2022   HDL 49 11/05/2022   LDLCALC 24 11/05/2022   TRIG 102 11/05/2022   CHOLHDL 2 04/05/2019   Lab Results  Component Value Date   VD25OH 43.2 11/05/2022   VD25OH 12.32 (L) 08/04/2022   Lab Results  Component Value Date   WBC 9.5 08/04/2022   HGB 10.1 (L) 08/04/2022   HCT 37.5 11/05/2022   MCV  71.5 (L) 08/04/2022   PLT 595.0 (H) 08/04/2022   Lab Results  Component Value Date   IRON 57 11/05/2022   TIBC 369 11/05/2022   FERRITIN 38 11/05/2022   Attestation Statements:   Reviewed by clinician on day of visit: allergies, medications, problem list, medical history, surgical history, family history, social history, and previous encounter notes.   Trude Mcburney, am acting as Energy manager for [employer], DO.  I have reviewed the above documentation for accuracy and completeness, and I agree with the above. Corinna Capra, DO

## 2022-11-23 ENCOUNTER — Ambulatory Visit: Payer: No Typology Code available for payment source | Admitting: Gastroenterology

## 2022-11-23 ENCOUNTER — Encounter: Payer: Self-pay | Admitting: Bariatrics

## 2022-11-30 ENCOUNTER — Other Ambulatory Visit: Payer: Self-pay | Admitting: Family

## 2022-11-30 MED ORDER — AMLODIPINE BESYLATE 10 MG PO TABS: 10.0000 mg | ORAL_TABLET | Freq: Every day | ORAL | 0 refills | Status: DC

## 2022-11-30 MED ORDER — VALSARTAN-HYDROCHLOROTHIAZIDE 320-25 MG PO TABS: 1.0000 | ORAL_TABLET | Freq: Every day | ORAL | 0 refills | Status: DC

## 2022-12-02 ENCOUNTER — Ambulatory Visit (INDEPENDENT_AMBULATORY_CARE_PROVIDER_SITE_OTHER): Payer: No Typology Code available for payment source | Admitting: Bariatrics

## 2022-12-02 ENCOUNTER — Encounter: Payer: Self-pay | Admitting: Bariatrics

## 2022-12-02 VITALS — BP 134/77 | HR 98 | Temp 98.0°F | Ht 61.0 in | Wt 321.0 lb

## 2022-12-02 DIAGNOSIS — R7303 Prediabetes: Secondary | ICD-10-CM

## 2022-12-02 DIAGNOSIS — F5089 Other specified eating disorder: Secondary | ICD-10-CM | POA: Diagnosis not present

## 2022-12-02 DIAGNOSIS — E662 Morbid (severe) obesity with alveolar hypoventilation: Secondary | ICD-10-CM

## 2022-12-02 DIAGNOSIS — Z6841 Body Mass Index (BMI) 40.0 and over, adult: Secondary | ICD-10-CM

## 2022-12-02 DIAGNOSIS — I10 Essential (primary) hypertension: Secondary | ICD-10-CM | POA: Diagnosis not present

## 2022-12-02 MED ORDER — BUPROPION HCL ER (SR) 150 MG PO TB12: 150.0000 mg | ORAL_TABLET | Freq: Every day | ORAL | 0 refills | Status: DC

## 2022-12-02 NOTE — Progress Notes (Addendum)
WEIGHT SUMMARY AND BIOMETRICS  Weight Lost Since Last Visit: 1lb  Weight Gained Since Last Visit: 0   Vitals Temp: 98 F (36.7 C) BP: 134/77 Pulse Rate: 98 SpO2: 98 %   Anthropometric Measurements Height: 5\' 1"  (1.549 m) Weight: (!) 321 lb (145.6 kg) BMI (Calculated): 60.68 Weight at Last Visit: 322lb Weight Lost Since Last Visit: 1lb Weight Gained Since Last Visit: 0 Starting Weight: 238lb Total Weight Loss (lbs): 7 lb (3.175 kg)   Body Composition  Body Fat %: 57.3 % Fat Mass (lbs): 184.3 lbs Muscle Mass (lbs): 130.2 lbs Visceral Fat Rating : 24   Other Clinical Data Fasting: no Labs: no Today's Visit #: 3 Starting Date: 11/05/22    OBESITY Chayah is here to discuss her progress with her obesity treatment plan along with follow-up of her obesity related diagnoses.    Nutrition Plan: the Category 4 plan - 90% adherence.  Current exercise: none  Interim History:  She is down 1 lb since her last visit.  Eating all of the food on the plan., Protein intake is as prescribed, Is not skipping meals, and Water intake is adequate.  Her appetite fluctuates.   Pharmacotherapy: Ellianne is on no medications at this time.  She has no contraindications to Wellbutrin.  Hunger is moderately controlled.  Cravings are moderately controlled.  Assessment/Plan:   Prediabetes Last A1c was 6.3  Medication(s): none Lab Results  Component Value Date   HGBA1C 6.3 (H) 11/05/2022   HGBA1C 6.2 08/04/2022   HGBA1C 6.2 08/12/2021   HGBA1C 5.9 12/17/2020   HGBA1C 5.3 03/30/2017   Lab Results  Component Value Date   INSULIN 41.7 (H) 11/05/2022    Plan: Will minimize all refined carbohydrates both sweets and starches.  Will work on the plan and exercise.  Consider both aerobic and resistance training.  Will keep protein, water, and fiber intake high.  Aim  for 7 to 9 hours of sleep nightly.  Referral to primary care.   Eating disorder/emotional eating Ixel has had issues with stress eating, emotional eating, nighttime eating, and boredom eating. Currently this is moderately controlled. Overall mood is stable. Denies suicidal/homicidal ideation. Medication(s): None  Plan:  Motivational interviewing as well as evidence-based interventions for health behavior change were utilized today including the discussion of self monitoring techniques, problem-solving barriers and SMART goal setting techniques.  Discussed distractions to curb eating behaviors. Will begin Wellbutrin 150 mg daily #30 without refills. She denies any contraindications. We discussed the benefits and risks.  Be sure to get adequate rest as lack of rest can trigger appetite.  Have plan in place for stressful events.  Consider other rewards besides food.       Morbid Obesity: Current BMI BMI (Calculated): 60.68    Kymani is currently in the action stage of change. As such, her goal is to continue with weight  loss efforts.  She has agreed to the Category 4 plan.  Exercise goals: All adults should avoid inactivity. Some physical activity is better than none, and adults who participate in any amount of physical activity gain some health benefits.  Behavioral modification strategies: increasing lean protein intake, no meal skipping, meal planning , increase water intake, better snacking choices, planning for success, increasing vegetables, get rid of junk food in the home, keep healthy foods in the home, work on smaller portions, and mindful eating.  Carolle has agreed to follow-up with our clinic in 2 weeks.      Objective:   VITALS: Per patient if applicable, see vitals. GENERAL: Alert and in no acute distress. CARDIOPULMONARY: No increased WOB. Speaking in clear sentences.  PSYCH: Pleasant and cooperative. Speech normal rate and rhythm. Affect is appropriate.  Insight and judgement are appropriate. Attention is focused, linear, and appropriate.  NEURO: Oriented as arrived to appointment on time with no prompting.   Attestation Statements:   This was prepared with the assistance of Engineer, civil (consulting).  Occasional wrong-word or sound-a-like substitutions may have occurred due to the inherent limitations of voice recognition

## 2022-12-10 ENCOUNTER — Other Ambulatory Visit: Payer: Self-pay | Admitting: Family

## 2022-12-10 DIAGNOSIS — I10 Essential (primary) hypertension: Secondary | ICD-10-CM

## 2022-12-16 ENCOUNTER — Ambulatory Visit: Payer: No Typology Code available for payment source | Admitting: Bariatrics

## 2022-12-16 ENCOUNTER — Encounter: Payer: Self-pay | Admitting: Bariatrics

## 2022-12-16 VITALS — BP 130/80 | HR 91 | Temp 97.9°F | Ht 61.0 in | Wt 314.0 lb

## 2022-12-16 DIAGNOSIS — I10 Essential (primary) hypertension: Secondary | ICD-10-CM | POA: Diagnosis not present

## 2022-12-16 DIAGNOSIS — Z6841 Body Mass Index (BMI) 40.0 and over, adult: Secondary | ICD-10-CM | POA: Diagnosis not present

## 2022-12-16 DIAGNOSIS — K5900 Constipation, unspecified: Secondary | ICD-10-CM

## 2022-12-16 DIAGNOSIS — E66813 Obesity, class 3: Secondary | ICD-10-CM

## 2022-12-16 NOTE — Progress Notes (Signed)
WEIGHT SUMMARY AND BIOMETRICS  Weight Lost Since Last Visit: 7lb  Weight Gained Since Last Visit: 0lb   Vitals Temp: 97.9 F (36.6 C) BP: 130/80 Pulse Rate: 91 SpO2: 98 %   Anthropometric Measurements Height: 5\' 1"  (1.549 m) Weight: (!) 314 lb (142.4 kg) BMI (Calculated): 59.36 Weight at Last Visit: 321lb Weight Lost Since Last Visit: 7lb Weight Gained Since Last Visit: 0lb Starting Weight: 328lb Total Weight Loss (lbs): 14 lb (6.35 kg)   Body Composition  Body Fat %: 55.3 % Fat Mass (lbs): 173.8 lbs Muscle Mass (lbs): 133.4 lbs Total Body Water (lbs): 100.8 lbs Visceral Fat Rating : 23   Other Clinical Data Fasting: No Labs: No Today's Visit #: 4 Starting Date: 11/05/22    OBESITY Cristina Adkins is here to discuss her progress with her obesity treatment plan along with follow-up of her obesity related diagnoses.    Nutrition Plan: the Category 4 plan - 50% adherence.  Current exercise: none  Interim History:  She is down another 7 lbs since her last visit. She has not been eating much due to abdominal pain. She has a history of constipation.  Eating all of the food on the plan., Protein intake is as prescribed, Is skipping meals, and Water intake is adequate.   hunger is moderately controlled.  Cravings are moderately controlled.  Assessment/Plan:   Hypertension Hypertension control uncertain. Her original blood pressure was slightly elevated but her repeat blood pressure was normal. She is taking her medications as directed.  Medication(s): Amlodipine 10 mg 1 daily , Coreg 3.125 mg, and Diovan-HCTZ 320/25 mg  BP Readings from Last 3 Encounters:  12/16/22 130/80  12/02/22 134/77  11/18/22 109/72   Lab Results  Component Value Date   CREATININE 1.05 (H) 11/05/2022   CREATININE 1.04 08/04/2022   CREATININE 0.87 07/14/2022   Lab Results   Component Value Date   GFR 67.04 08/04/2022   GFR 71.61 08/12/2021   GFR 74.47 04/16/2021    Plan: Continue all antihypertensives at current dosages. No added salt. Will keep sodium content to 1,500 mg or less per day.    Constipation Diondra notes constipation.   She has been taking a laxative OTC. . Constipation is poorly controlled.   Plan: Increase fiber up to 25 to 30 grams of fiber.   (Fiber sheet) Increase water intake to at least 64 ounces daily.  Add Metamucil or Citrucel daily. Will use Fleet suppositories as needed.  Add magnesium citrate daily.     Morbid Obesity: Current BMI BMI (Calculated): 59.36   Cristina Adkins is currently in the action stage of change. As such, her goal is to continue with weight loss efforts.  She has agreed to the Category 4 plan.  Exercise goals: For substantial health benefits, adults should do at least 150 minutes (2 hours and 30 minutes) a week of moderate-intensity, or  75 minutes (1 hour and 15 minutes) a week of vigorous-intensity aerobic physical activity, or an equivalent combination of moderate- and vigorous-intensity aerobic activity. Aerobic activity should be performed in episodes of at least 10 minutes, and preferably, it should be spread throughout the week.  Behavioral modification strategies: increasing lean protein intake, no meal skipping, decrease eating out, meal planning , increase water intake, planning for success, increasing vegetables, keep healthy foods in the home, and mindful eating.  Cristina Adkins has agreed to follow-up with our clinic in 4 weeks.      Objective:   VITALS: Per patient if applicable, see vitals. GENERAL: Alert and in no acute distress. CARDIOPULMONARY: No increased WOB. Speaking in clear sentences.  PSYCH: Pleasant and cooperative. Speech normal rate and rhythm. Affect is appropriate. Insight and judgement are appropriate. Attention is focused, linear, and appropriate.  NEURO: Oriented as arrived to  appointment on time with no prompting.   Attestation Statements:    This was prepared with the assistance of Engineer, civil (consulting).  Occasional wrong-word or sound-a-like substitutions may have occurred due to the inherent limitations of voice recognition   Corinna Capra, DO

## 2023-01-06 ENCOUNTER — Ambulatory Visit: Payer: No Typology Code available for payment source | Admitting: Bariatrics

## 2023-01-13 ENCOUNTER — Ambulatory Visit: Payer: No Typology Code available for payment source | Admitting: Family

## 2023-01-13 VITALS — BP 143/86 | HR 85 | Temp 98.8°F | Resp 16 | Ht 61.0 in | Wt 316.0 lb

## 2023-01-13 DIAGNOSIS — D649 Anemia, unspecified: Secondary | ICD-10-CM

## 2023-01-13 DIAGNOSIS — Z91040 Latex allergy status: Secondary | ICD-10-CM

## 2023-01-13 DIAGNOSIS — Z91018 Allergy to other foods: Secondary | ICD-10-CM | POA: Diagnosis not present

## 2023-01-13 DIAGNOSIS — N920 Excessive and frequent menstruation with regular cycle: Secondary | ICD-10-CM

## 2023-01-13 DIAGNOSIS — E559 Vitamin D deficiency, unspecified: Secondary | ICD-10-CM

## 2023-01-13 DIAGNOSIS — E538 Deficiency of other specified B group vitamins: Secondary | ICD-10-CM

## 2023-01-13 DIAGNOSIS — I10 Essential (primary) hypertension: Secondary | ICD-10-CM | POA: Diagnosis not present

## 2023-01-13 DIAGNOSIS — R195 Other fecal abnormalities: Secondary | ICD-10-CM

## 2023-01-13 MED ORDER — BUPROPION HCL ER (SR) 150 MG PO TB12: 150.0000 mg | ORAL_TABLET | Freq: Every day | ORAL | 0 refills | Status: AC

## 2023-01-13 MED ORDER — METOPROLOL SUCCINATE ER 50 MG PO TB24: 50.0000 mg | ORAL_TABLET | Freq: Every day | ORAL | 1 refills | Status: DC

## 2023-01-13 MED ORDER — VALSARTAN-HYDROCHLOROTHIAZIDE 320-25 MG PO TABS: 1.0000 | ORAL_TABLET | Freq: Every day | ORAL | 1 refills | Status: DC

## 2023-01-13 MED ORDER — EPINEPHRINE 0.3 MG/0.3ML IJ SOAJ: 0.3000 mg | INTRAMUSCULAR | 0 refills | Status: AC | PRN

## 2023-01-13 MED ORDER — VITAMIN D3 75 MCG (3000 UT) PO TABS: 1.0000 | ORAL_TABLET | Freq: Every day | ORAL | Status: AC

## 2023-01-13 MED ORDER — AMLODIPINE BESYLATE 10 MG PO TABS: 10.0000 mg | ORAL_TABLET | Freq: Every day | ORAL | 1 refills | Status: DC

## 2023-01-13 NOTE — Assessment & Plan Note (Signed)
  Completed 12 weeks of weekly supplementation. -Start daily over the counter Vitamin D supplementation at 3000 units.

## 2023-01-13 NOTE — Progress Notes (Signed)
Subjective:     Patient ID: Cristina Adkins, female    DOB: [birth year], 42 y.o.   MRN: [id number]  Chief Complaint  Patient presents with   Hypertension    Here for follow     HPI  Discussed the use of AI scribe software for clinical note transcription with the patient, who gave verbal consent to proceed.  History of Present Illness   The patient, with a history of hypertension, asthma, and iron deficiency anemia, presents for a routine follow-up. She reports running out of her blood pressure medication a few days ago, which includes Diovan HCT, amlodipine, and carvedilol. She expresses difficulty with the twice-daily dosing of carvedilol, often forgetting the second dose and compensating by taking two doses in the morning.  The patient also reports improvement in her asthma symptoms, stating her breathing has improved. She is scheduled for a follow-up with her pulmonologist for another breathing treatment.  Regarding her iron deficiency anemia, the patient reports compliance with her daily iron pills and has noticed an improvement in her energy levels and a reduction in menstrual bleeding. She also mentions a recent scare related to her iron levels dropping to nine, which left her feeling washed out and falling asleep at work.  The patient also mentions a recent endoscopy and colonoscopy performed by a provider in [place], which came back negative. However, the provider expressed concern about a potential unseen ulcer in the middle part of her stomach and recommended a capsule endoscopy.  Lastly, the patient is on Wellbutrin for mood management, which she reports is working well with no concerns about depression or anxiety.        Health Maintenance Due  Topic Date Due   DTaP/Tdap/Td (1 - Tdap) Never done   COVID-19 Vaccine (1 - 2024-25 season) Never done    Past Medical History:  Diagnosis Date   Anemia    Back pain    Chest pain    Constipation    GERD (gastroesophageal  reflux disease)    History of alcohol abuse    Hypertension    Hypokalemia    Iron deficiency    Lactose intolerance    Mini stroke    Sleep apnea    Swelling of lower extremity    Vitamin B 12 deficiency    Vitamin D deficiency     Past Surgical History:  Procedure Laterality Date   COLONOSCOPY WITH ESOPHAGOGASTRODUODENOSCOPY (EGD)  09/08/2022   [facility] [place], [place] [address]    Family History  Problem Relation Age of Onset   Hypertension Mother    Alcoholism Mother    Drug abuse Mother    Prostate cancer Father    Hypertension Brother    Hypertension Maternal Grandmother    Diabetes Daughter    Lung cancer Daughter    Heart disease Maternal Uncle    Diabetes Maternal Uncle    Colon cancer Maternal Uncle     Social History   Socioeconomic History   Marital status: Single    Spouse name: Not on file   Number of children: 1   Years of education: Not on file   Highest education level: Associate degree: occupational, Scientist, product/process development, or vocational program  Occupational History   Occupation: Advice worker  Tobacco Use   Smoking status: Former    Current packs/day: 0.00    Average packs/day: 1 pack/day for 9.0 years (9.0 ttl pk-yrs)    Types: Cigarettes    Start date: 11/22/2011    Quit  date: 11/21/2020    Years since quitting: 2.1   Smokeless tobacco: Never  Vaping Use   Vaping status: Some Days   Substances: Nicotine  Substance and Sexual Activity   Alcohol use: No   Drug use: No   Sexual activity: Yes    Partners: Male    Birth control/protection: Abstinence  Other Topics Concern   Not on file  Social History Narrative   Grandmother is irish/indian   2 half-sisters   2 sisters (full)   1/2 brother   Older brother same mom- brother has CP   3 1/2 younger brothers after me   1 daughter    Boyfriend   Works as a Futures trader   Completed associate degree in Theatre manager   No pets   Enjoys Clinical cytogeneticist   Social  Drivers of Corporate investment banker Strain: Low Risk  (08/03/2022)   Overall Financial Resource Strain (CARDIA)    Difficulty of Paying Living Expenses: Not hard at all  Food Insecurity: No Food Insecurity (08/03/2022)   Hunger Vital Sign    Worried About Running Out of Food in the Last Year: Never true    Ran Out of Food in the Last Year: Never true  Transportation Needs: No Transportation Needs (08/03/2022)   PRAPARE - Administrator, Civil Service (Medical): No    Lack of Transportation (Non-Medical): No  Physical Activity: Insufficiently Active (08/03/2022)   Exercise Vital Sign    Days of Exercise per Week: 2 days    Minutes of Exercise per Session: 20 min  Stress: No Stress Concern Present (08/03/2022)   [employer] of Occupational Health - Occupational Stress Questionnaire    Feeling of Stress : Only a little  Social Connections: Socially Isolated (08/03/2022)   Social Connection and Isolation Panel [NHANES]    Frequency of Communication with Friends and Family: Once a week    Frequency of Social Gatherings with Friends and Family: Once a week    Attends Religious Services: Never    Database administrator or Organizations: No    Attends Engineer, structural: Not on file    Marital Status: Never married  Intimate Partner Violence: Not At Risk (07/10/2022)   Humiliation, Afraid, Rape, and Kick questionnaire    Fear of Current or Ex-Partner: No    Emotionally Abused: No    Physically Abused: No    Sexually Abused: No    Outpatient Medications Prior to Visit  Medication Sig Dispense Refill   albuterol (VENTOLIN HFA) 108 (90 Base) MCG/ACT inhaler Inhale 2 puffs into the lungs every 6 (six) hours as needed for wheezing or shortness of breath. 18 g 2   carvedilol (COREG) 3.125 MG tablet Take 1 tablet (3.125 mg total) by mouth 2 (two) times daily with a meal. 60 tablet 0   Fiber Adult Gummies 2 g CHEW Take a directed on bottle     Iron, Ferrous Sulfate, 325 (65  Fe) MG TABS Take 325 mg by mouth every other day. 30 tablet    Multiple Vitamins-Minerals (MULTIVITAMIN WITH MINERALS) tablet Take 1 tablet by mouth daily.     ondansetron (ZOFRAN-ODT) 4 MG disintegrating tablet Take 1 tablet (4 mg total) by mouth every 8 (eight) hours as needed for nausea or vomiting. 20 tablet 0   polyethylene glycol (MIRALAX / GLYCOLAX) 17 g packet Take 17 g by mouth 2 (two) times daily. 14 each 0   Vitamin D, Ergocalciferol, (DRISDOL) 1.25 MG (  50000 UNIT) CAPS capsule Take 1 capsule (50,000 Units total) by mouth every 7 (seven) days. 12 capsule 0   Albuterol-Budesonide (AIRSUPRA) 90-80 MCG/ACT AERO Inhale 2 puffs into the lungs as needed (maximum 12 puffs/day). 10.7 g 2   amLODipine (NORVASC) 10 MG tablet Take 1 tablet (10 mg total) by mouth daily. 30 tablet 0   buPROPion (WELLBUTRIN SR) 150 MG 12 hr tablet Take 1 tablet (150 mg total) by mouth daily. 30 tablet 0   EPINEPHrine 0.3 mg/0.3 mL IJ SOAJ injection Inject 0.3 mg into the muscle as needed for anaphylaxis. 2 each 0   valsartan-hydrochlorothiazide (DIOVAN-HCT) 320-25 MG tablet Take 1 tablet by mouth daily. 30 tablet 0   cyanocobalamin (VITAMIN B12) 1000 MCG tablet Take 1 tablet (1,000 mcg total) by mouth daily.     No facility-administered medications prior to visit.    Allergies  Allergen Reactions   Latex Other (See Comments)    Irritated and burning skin   Other Swelling and Other (See Comments)    Allergic to all fruit and raw tomatoes reaction face swells    ROS    See HPI Objective:    Physical Exam Constitutional:      General: She is not in acute distress.    Appearance: Normal appearance. She is well-developed.  HENT:     Head: Normocephalic and atraumatic.     Right Ear: External ear normal.     Left Ear: External ear normal.  Eyes:     General: No scleral icterus. Neck:     Thyroid: No thyromegaly.  Cardiovascular:     Rate and Rhythm: Normal rate and regular rhythm.     Heart sounds:  Normal heart sounds. No murmur heard. Pulmonary:     Effort: Pulmonary effort is normal. No respiratory distress.     Breath sounds: Normal breath sounds. No wheezing.  Musculoskeletal:     Cervical back: Neck supple.  Skin:    General: Skin is warm and dry.  Neurological:     Mental Status: She is alert and oriented to person, place, and time.  Psychiatric:        Mood and Affect: Mood normal.        Behavior: Behavior normal.        Thought Content: Thought content normal.        Judgment: Judgment normal.      BP (!) 143/86 (BP Location: Right Arm, Patient Position: Sitting, Cuff Size: Normal)   Pulse 85   Temp 98.8 F (37.1 C) (Oral)   Resp 16   Ht 5\' 1"  (1.549 m)   Wt (!) 316 lb (143.3 kg)   SpO2 99%   BMI 59.71 kg/m  Wt Readings from Last 3 Encounters:  01/13/23 (!) 316 lb (143.3 kg)  12/16/22 (!) 314 lb (142.4 kg)  12/02/22 (!) 321 lb (145.6 kg)       Assessment & Plan:   Problem List Items Addressed This Visit       Unprioritized   Vitamin D deficiency    Completed 12 weeks of weekly supplementation. -Start daily over the counter Vitamin D supplementation at 3000 units.      Latex allergy   Refill epipen.      Relevant Medications   EPINEPHrine 0.3 mg/0.3 mL IJ SOAJ injection   Heme positive stool   Reports normal Endo/colo in [place].  GI specialist in [place] recommended capsule endoscopy.  Will refer to local gastroenterologist for ongoing evaluation.  Relevant Orders   Ambulatory referral to Gastroenterology   Heavy menstrual bleeding   This has improved. Monitor.       Essential hypertension - Primary   Elevated blood pressure likely due to running out of antihypertensive medications. Difficulty with twice daily dosing of Carvedilol. -Change Carvedilol to Metoprolol extended release for once daily dosing. -Recheck blood pressure in 1 month.      Relevant Medications   amLODipine (NORVASC) 10 MG tablet   EPINEPHrine 0.3 mg/0.3 mL IJ SOAJ  injection   valsartan-hydrochlorothiazide (DIOVAN-HCT) 320-25 MG tablet   metoprolol succinate (TOPROL XL) 50 MG 24 hr tablet   B12 deficiency   Not on supplement, most recent b12 level was normal. Advised no need to start b12 at this time.       Anemia   Update cbc/iron studies.       Relevant Orders   CBC w/Diff   Iron, TIBC and Ferritin Panel   Other Visit Diagnoses       Food allergy       Relevant Medications   EPINEPHrine 0.3 mg/0.3 mL IJ SOAJ injection       I have discontinued Cristina Adkins "Denise"'s Airsupra. I am also having her start on metoprolol succinate and Vitamin D3. Additionally, I am having her maintain her multivitamin with minerals, albuterol, ondansetron, polyethylene glycol, Fiber Adult Gummies, cyanocobalamin, Iron (Ferrous Sulfate), Vitamin D (Ergocalciferol), carvedilol, amLODipine, buPROPion, EPINEPHrine, and valsartan-hydrochlorothiazide.  Meds ordered this encounter  Medications   amLODipine (NORVASC) 10 MG tablet    Sig: Take 1 tablet (10 mg total) by mouth daily.    Dispense:  90 tablet    Refill:  1    Requested drug refills are authorized, however, the patient needs further evaluation and/or laboratory testing before further refills are given. Ask her to make an appointment for this.   buPROPion (WELLBUTRIN SR) 150 MG 12 hr tablet    Sig: Take 1 tablet (150 mg total) by mouth daily.    Dispense:  30 tablet    Refill:  0   EPINEPHrine 0.3 mg/0.3 mL IJ SOAJ injection    Sig: Inject 0.3 mg into the muscle as needed for anaphylaxis.    Dispense:  2 each    Refill:  0   valsartan-hydrochlorothiazide (DIOVAN-HCT) 320-25 MG tablet    Sig: Take 1 tablet by mouth daily.    Dispense:  90 tablet    Refill:  1    Requested drug refills are authorized, however, the patient needs further evaluation and/or laboratory testing before further refills are given. Ask her to make an appointment for this.   metoprolol succinate (TOPROL XL) 50 MG 24 hr  tablet    Sig: Take 1 tablet (50 mg total) by mouth daily. Take with or immediately following a meal.    Dispense:  30 tablet    Refill:  1    Supervising Provider:   Danise Edge A [[id number]]   Cholecalciferol (VITAMIN D3) 75 MCG (3000 UT) TABS    Sig: Take 1 tablet by mouth daily at 6 (six) AM.    Supervising Provider:   Danise Edge A [phone]

## 2023-01-13 NOTE — Patient Instructions (Signed)
VISIT SUMMARY:  Today, we reviewed your ongoing health conditions, including hypertension, asthma, and iron deficiency anemia. We discussed your recent medication challenges, improvements in asthma symptoms, and the results of your recent endoscopy and colonoscopy. We also addressed your vitamin D deficiency and general health maintenance.  YOUR PLAN:  -HYPERTENSION: Hypertension, or high blood pressure, can lead to serious health problems if not managed properly. Since you ran out of your blood pressure medications, we will switch your Carvedilol to Metoprolol extended release, which you only need to take once daily. Please recheck your blood pressure in one month.  -IRON DEFICIENCY ANEMIA: Iron deficiency anemia occurs when your body doesn't have enough iron to produce adequate red blood cells. Your energy levels and menstrual bleeding have improved with daily iron pills. Continue taking your iron supplements as prescribed.  -VITAMIN D DEFICIENCY: Vitamin D deficiency means your body lacks enough vitamin D, which is important for bone health and immune function. After completing 12 weeks of weekly supplementation, you should now start taking 3000 units of over-the-counter Vitamin D daily.  -GENERAL HEALTH MAINTENANCE: For your overall health, we administered a Tetanus vaccine today and will check your Vitamin D levels. Keeping up with vaccinations and monitoring vitamin levels are important for your general well-being.  INSTRUCTIONS:  Please recheck your blood pressure in one month. Continue with your daily iron and Vitamin D supplements. Follow up with your pulmonologist for your next breathing treatment. Consider scheduling a capsule endoscopy as recommended by your provider in [place] to check for any potential unseen ulcers.

## 2023-01-13 NOTE — Assessment & Plan Note (Signed)
This has improved. Monitor.

## 2023-01-13 NOTE — Assessment & Plan Note (Signed)
Not on supplement, most recent b12 level was normal. Advised no need to start b12 at this time.

## 2023-01-13 NOTE — Assessment & Plan Note (Signed)
Refill epi pen  

## 2023-01-13 NOTE — Assessment & Plan Note (Signed)
Reports normal Endo/colo in [place].  GI specialist in [place] recommended capsule endoscopy.  Will refer to local gastroenterologist for ongoing evaluation.

## 2023-01-13 NOTE — Assessment & Plan Note (Signed)
Elevated blood pressure likely due to running out of antihypertensive medications. Difficulty with twice daily dosing of Carvedilol. -Change Carvedilol to Metoprolol extended release for once daily dosing. -Recheck blood pressure in 1 month.

## 2023-01-13 NOTE — Assessment & Plan Note (Signed)
Update cbc/iron studies.

## 2023-01-14 LAB — CBC WITH DIFFERENTIAL/PLATELET
Basophils Absolute: 0.1 10*3/uL (ref 0.0–0.1)
Basophils Relative: 0.7 % (ref 0.0–3.0)
Eosinophils Absolute: 0.1 10*3/uL (ref 0.0–0.7)
Eosinophils Relative: 1.5 % (ref 0.0–5.0)
HCT: 37.7 % (ref 36.0–46.0)
Hemoglobin: 12 g/dL (ref 12.0–15.0)
Lymphocytes Relative: 14.2 % (ref 12.0–46.0)
Lymphs Abs: 1.3 10*3/uL (ref 0.7–4.0)
MCHC: 31.8 g/dL (ref 30.0–36.0)
MCV: 79.1 fL (ref 78.0–100.0)
Monocytes Absolute: 0.5 10*3/uL (ref 0.1–1.0)
Monocytes Relative: 5.5 % (ref 3.0–12.0)
Neutro Abs: 7.3 10*3/uL (ref 1.4–7.7)
Neutrophils Relative %: 78.1 % — ABNORMAL HIGH (ref 43.0–77.0)
Platelets: 430 10*3/uL — ABNORMAL HIGH (ref 150.0–400.0)
RBC: 4.77 Mil/uL (ref 3.87–5.11)
RDW: 26.9 % — ABNORMAL HIGH (ref 11.5–15.5)
WBC: 9.4 10*3/uL (ref 4.0–10.5)

## 2023-01-14 LAB — IRON,TIBC AND FERRITIN PANEL
%SAT: 11 % — ABNORMAL LOW (ref 16–45)
Ferritin: 37 ng/mL (ref 16–232)
Iron: 35 ug/dL — ABNORMAL LOW (ref 40–190)
TIBC: 319 ug/dL (ref 250–450)

## 2023-02-23 ENCOUNTER — Ambulatory Visit: Payer: No Typology Code available for payment source | Admitting: Family

## 2023-02-26 ENCOUNTER — Ambulatory Visit (INDEPENDENT_AMBULATORY_CARE_PROVIDER_SITE_OTHER): Payer: No Typology Code available for payment source | Admitting: Family

## 2023-02-26 VITALS — BP 133/84 | HR 80 | Temp 98.6°F | Resp 16

## 2023-02-26 DIAGNOSIS — R195 Other fecal abnormalities: Secondary | ICD-10-CM | POA: Diagnosis not present

## 2023-02-26 DIAGNOSIS — I10 Essential (primary) hypertension: Secondary | ICD-10-CM

## 2023-02-26 NOTE — Assessment & Plan Note (Signed)
Refer to local GI for eval and capsule endo to complete her GI eval. Colo and endo were already performed in [place].

## 2023-02-26 NOTE — Assessment & Plan Note (Signed)
BP looks ok. She wishes to monitor off of toprol xl and repeat bp in 2 weeks.

## 2023-02-26 NOTE — Progress Notes (Signed)
Subjective:     Patient ID: Cristina Adkins, female    DOB: 1980-07-23, 43 y.o.   MRN: [id number]  Chief Complaint  Patient presents with   Hypertension    Here for follow up, patient reports she has not been able to take metoprolol as prescribed dur to side effects.     HPI  Discussed the use of AI scribe software for clinical note transcription with the patient, who gave verbal consent to proceed.  History of Present Illness   The patient presents with issues related to blood pressure management and abdominal pain.  The patient has been experiencing difficulty tolerating metoprolol due to side effects such as sleepiness and headaches, despite its effectiveness in managing blood pressure. She has been taking four small doses of metoprolol but continues to experience significant drowsiness and headaches, even when dosed at bedtime. She has adjusted her diet by eliminating beef and reducing sodium intake. Currently, she is taking carvedilol twice daily, which she tolerates better.  The patient has been experiencing abdominal pain and previously had blood in the stool and dropping red blood cell counts. A colonoscopy and endoscopy appeared normal, but further investigation was suggested to examine the small intestine. She expresses frustration with previous GI providers and is open to being referred to a different practice.  She is taking an over-the-counter iron supplement, which has improved her iron levels compared to six or seven months ago. Hemoglobin and hematocrit are within the normal range, though on the lower end. Platelet counts are slightly elevated but have decreased from previous levels. She inquires about the implications of high platelet counts, which may be genetic or related to other factors.  Her B12 and vitamin D levels were checked in the fall and were satisfactory. She is aware of her A1c levels being borderline and has been reducing starchy foods in her diet. She prefers  to delay further blood tests until a future visit.  She is due for a tetanus shot and has questions about TB screening.      BP Readings from Last 3 Encounters:  02/26/23 133/84  01/13/23 (!) 143/86  12/16/22 130/80      Health Maintenance Due  Topic Date Due   Pneumococcal Vaccine 37-55 Years old (1 of 2 - PCV) Never done   DTaP/Tdap/Td (1 - Tdap) Never done   COVID-19 Vaccine (1 - 2024-25 season) Never done    Past Medical History:  Diagnosis Date   Anemia    Back pain    Chest pain    Constipation    GERD (gastroesophageal reflux disease)    History of alcohol abuse    Hypertension    Hypokalemia    Iron deficiency    Lactose intolerance    Mini stroke    Sleep apnea    Swelling of lower extremity    Vitamin B 12 deficiency    Vitamin D deficiency     Past Surgical History:  Procedure Laterality Date   COLONOSCOPY WITH ESOPHAGOGASTRODUODENOSCOPY (EGD)  09/08/2022   [facility] [place], [place] [address]    Family History  Problem Relation Age of Onset   Hypertension Mother    Alcoholism Mother    Drug abuse Mother    Prostate cancer Father    Hypertension Brother    Hypertension Maternal Grandmother    Diabetes Daughter    Lung cancer Daughter    Heart disease Maternal Uncle    Diabetes Maternal Uncle    Colon cancer Maternal  Uncle     Social History   Socioeconomic History   Marital status: Single    Spouse name: Not on file   Number of children: 1   Years of education: Not on file   Highest education level: Associate degree: occupational, Scientist, product/process development, or vocational program  Occupational History   Occupation: Advice worker  Tobacco Use   Smoking status: Former    Current packs/day: 0.00    Average packs/day: 1 pack/day for 9.0 years (9.0 ttl pk-yrs)    Types: Cigarettes    Start date: 11/22/2011    Quit date: 11/21/2020    Years since quitting: 2.2   Smokeless tobacco: Never  Vaping Use   Vaping status: Some Days    Substances: Nicotine  Substance and Sexual Activity   Alcohol use: No   Drug use: No   Sexual activity: Yes    Partners: Male    Birth control/protection: Abstinence  Other Topics Concern   Not on file  Social History Narrative   Grandmother is irish/indian   2 half-sisters   2 sisters (full)   1/2 brother   Older brother same mom- brother has CP   3 1/2 younger brothers after me   1 daughter    Boyfriend   Works as a Futures trader   Completed associate degree in Theatre manager   No pets   Enjoys Clinical cytogeneticist   Social Drivers of Corporate investment banker Strain: Low Risk  (02/26/2023)   Overall Financial Resource Strain (CARDIA)    Difficulty of Paying Living Expenses: Not very hard  Food Insecurity: No Food Insecurity (02/26/2023)   Hunger Vital Sign    Worried About Running Out of Food in the Last Year: Never true    Ran Out of Food in the Last Year: Never true  Transportation Needs: No Transportation Needs (02/26/2023)   PRAPARE - Administrator, Civil Service (Medical): No    Lack of Transportation (Non-Medical): No  Physical Activity: Insufficiently Active (02/26/2023)   Exercise Vital Sign    Days of Exercise per Week: 3 days    Minutes of Exercise per Session: 30 min  Stress: No Stress Concern Present (02/26/2023)   [employer] of Occupational Health - Occupational Stress Questionnaire    Feeling of Stress : Not at all  Social Connections: Socially Isolated (02/26/2023)   Social Connection and Isolation Panel [NHANES]    Frequency of Communication with Friends and Family: Once a week    Frequency of Social Gatherings with Friends and Family: Once a week    Attends Religious Services: Never    Database administrator or Organizations: No    Attends Engineer, structural: Not on file    Marital Status: Never married  Intimate Partner Violence: Not At Risk (07/10/2022)   Humiliation, Afraid, Rape, and Kick questionnaire     Fear of Current or Ex-Partner: No    Emotionally Abused: No    Physically Abused: No    Sexually Abused: No    Outpatient Medications Prior to Visit  Medication Sig Dispense Refill   albuterol (VENTOLIN HFA) 108 (90 Base) MCG/ACT inhaler Inhale 2 puffs into the lungs every 6 (six) hours as needed for wheezing or shortness of breath. 18 g 2   amLODipine (NORVASC) 10 MG tablet Take 1 tablet (10 mg total) by mouth daily. 90 tablet 1   buPROPion (WELLBUTRIN SR) 150 MG 12 hr tablet Take 1 tablet (150 mg total) by mouth  daily. 30 tablet 0   carvedilol (COREG) 3.125 MG tablet Take 1 tablet (3.125 mg total) by mouth 2 (two) times daily with a meal. 60 tablet 0   Cholecalciferol (VITAMIN D3) 75 MCG (3000 UT) TABS Take 1 tablet by mouth daily at 6 (six) AM.     cyanocobalamin (VITAMIN B12) 1000 MCG tablet Take 1 tablet (1,000 mcg total) by mouth daily.     EPINEPHrine 0.3 mg/0.3 mL IJ SOAJ injection Inject 0.3 mg into the muscle as needed for anaphylaxis. 2 each 0   Fiber Adult Gummies 2 g CHEW Take a directed on bottle     Iron, Ferrous Sulfate, 325 (65 Fe) MG TABS Take 325 mg by mouth every other day. 30 tablet    Multiple Vitamins-Minerals (MULTIVITAMIN WITH MINERALS) tablet Take 1 tablet by mouth daily.     ondansetron (ZOFRAN-ODT) 4 MG disintegrating tablet Take 1 tablet (4 mg total) by mouth every 8 (eight) hours as needed for nausea or vomiting. 20 tablet 0   polyethylene glycol (MIRALAX / GLYCOLAX) 17 g packet Take 17 g by mouth 2 (two) times daily. 14 each 0   valsartan-hydrochlorothiazide (DIOVAN-HCT) 320-25 MG tablet Take 1 tablet by mouth daily. 90 tablet 1   Vitamin D, Ergocalciferol, (DRISDOL) 1.25 MG (50000 UNIT) CAPS capsule Take 1 capsule (50,000 Units total) by mouth every 7 (seven) days. 12 capsule 0   metoprolol succinate (TOPROL XL) 50 MG 24 hr tablet Take 1 tablet (50 mg total) by mouth daily. Take with or immediately following a meal. 30 tablet 1   No facility-administered  medications prior to visit.    Allergies  Allergen Reactions   Latex Other (See Comments)    Irritated and burning skin   Other Swelling and Other (See Comments)    Allergic to all fruit and raw tomatoes reaction face swells    ROS See HPI    Objective:    Physical Exam Constitutional:      General: She is not in acute distress.    Appearance: Normal appearance. She is well-developed.  HENT:     Head: Normocephalic and atraumatic.     Right Ear: External ear normal.     Left Ear: External ear normal.  Eyes:     General: No scleral icterus. Neck:     Thyroid: No thyromegaly.  Cardiovascular:     Rate and Rhythm: Normal rate and regular rhythm.     Heart sounds: Normal heart sounds. No murmur heard. Pulmonary:     Effort: Pulmonary effort is normal. No respiratory distress.     Breath sounds: Normal breath sounds. No wheezing.  Musculoskeletal:     Cervical back: Neck supple.  Skin:    General: Skin is warm and dry.  Neurological:     Mental Status: She is alert and oriented to person, place, and time.  Psychiatric:        Mood and Affect: Mood normal.        Behavior: Behavior normal.        Thought Content: Thought content normal.        Judgment: Judgment normal.      BP 133/84 (BP Location: Right Arm, Patient Position: Sitting, Cuff Size: Large)   Pulse 80   Temp 98.6 F (37 C) (Oral)   Resp 16   SpO2 95%  Wt Readings from Last 3 Encounters:  01/13/23 (!) 316 lb (143.3 kg)  12/16/22 (!) 314 lb (142.4 kg)  12/02/22 (!) 321 lb (145.6  kg)       Assessment & Plan:   Problem List Items Addressed This Visit       Unprioritized   Heme positive stool - Primary   Refer to local GI for eval and capsule endo to complete her GI eval. Colo and endo were already performed in [place].      Relevant Orders   Ambulatory referral to Gastroenterology   Essential hypertension   BP looks ok. She wishes to monitor off of toprol xl and repeat bp in 2 weeks.         I have discontinued Cristina Adkins "Denise"'s metoprolol succinate. I am also having her maintain her multivitamin with minerals, albuterol, ondansetron, polyethylene glycol, Fiber Adult Gummies, cyanocobalamin, Iron (Ferrous Sulfate), Vitamin D (Ergocalciferol), carvedilol, amLODipine, buPROPion, EPINEPHrine, valsartan-hydrochlorothiazide, and Vitamin D3.  No orders of the defined types were placed in this encounter.

## 2023-03-04 ENCOUNTER — Telehealth: Payer: Self-pay | Admitting: Family

## 2023-03-04 DIAGNOSIS — R195 Other fecal abnormalities: Secondary | ICD-10-CM

## 2023-03-04 NOTE — Telephone Encounter (Signed)
 Copied from CRM [id number]. Topic: Referral - Question >> Mar 04, 2023  9:18 AM Merlynn LABOR wrote: Reason for CRM: [facility] called in to advise that Dr. Rollin is not able to see patient for referral number [phone]. Please contact Dr. Rollin with any questions at [phone].

## 2023-03-05 NOTE — Addendum Note (Signed)
 Addended by: Dorrene Gaucher on: 03/05/2023 04:50 PM   Modules accepted: Orders

## 2023-03-12 ENCOUNTER — Ambulatory Visit: Payer: No Typology Code available for payment source | Admitting: Family

## 2023-03-12 VITALS — BP 134/84 | HR 101 | Temp 98.9°F | Resp 16 | Ht 61.0 in | Wt 313.0 lb

## 2023-03-12 DIAGNOSIS — I10 Essential (primary) hypertension: Secondary | ICD-10-CM

## 2023-03-12 DIAGNOSIS — E611 Iron deficiency: Secondary | ICD-10-CM | POA: Diagnosis not present

## 2023-03-12 DIAGNOSIS — R7303 Prediabetes: Secondary | ICD-10-CM | POA: Diagnosis not present

## 2023-03-12 DIAGNOSIS — R739 Hyperglycemia, unspecified: Secondary | ICD-10-CM

## 2023-03-12 NOTE — Progress Notes (Signed)
 Subjective:     Patient ID: Cristina Adkins, female    DOB: [birth year], 43 y.o.   MRN: [id number]  Chief Complaint  Patient presents with   Hypertension    Here for follow up    HPI  Discussed the use of AI scribe software for clinical note transcription with the patient, who gave verbal consent to proceed.  History of Present Illness    BP Readings from Last 3 Encounters:  03/12/23 134/84  02/26/23 133/84  01/13/23 (!) 143/86   Cristina Adkins "Angelique Blonder" is a 43 year old female who presents for follow-up on blood pressure management and blood work.  She remains off of metoprolol and is here for a bp recheck.  She has not experienced symptoms such as headaches, dizziness, or vision changes that might indicate poorly controlled hypertension. Her blood pressure is monitored regularly.  She is scheduled for blood work today, including A1c levels, as part of routine monitoring for her pre-diabetes mellitus. She manages her pre-diabetes with lifestyle modifications and medication, and has not reported symptoms of hyperglycemia or hypoglycemia.  She continues a daily iron supplement for her iron deficiency anemia.      Health Maintenance Due  Topic Date Due   Pneumococcal Vaccine 67-12 Years old (1 of 2 - PCV) Never done   DTaP/Tdap/Td (1 - Tdap) Never done   COVID-19 Vaccine (1 - 2024-25 season) Never done    Past Medical History:  Diagnosis Date   Anemia    Back pain    Chest pain    Constipation    GERD (gastroesophageal reflux disease)    History of alcohol abuse    Hypertension    Hypokalemia    Iron deficiency    Lactose intolerance    Mini stroke    Sleep apnea    Swelling of lower extremity    Vitamin B 12 deficiency    Vitamin D deficiency     Past Surgical History:  Procedure Laterality Date   COLONOSCOPY WITH ESOPHAGOGASTRODUODENOSCOPY (EGD)  09/08/2022   [facility] [place], [place] [address]    Family History  Problem Relation Age of Onset    Hypertension Mother    Alcoholism Mother    Drug abuse Mother    Prostate cancer Father    Hypertension Brother    Hypertension Maternal Grandmother    Diabetes Daughter    Lung cancer Daughter    Heart disease Maternal Uncle    Diabetes Maternal Uncle    Colon cancer Maternal Uncle     Social History   Socioeconomic History   Marital status: Single    Spouse name: Not on file   Number of children: 1   Years of education: Not on file   Highest education level: Associate degree: occupational, Scientist, product/process development, or vocational program  Occupational History   Occupation: Advice worker  Tobacco Use   Smoking status: Former    Current packs/day: 0.00    Average packs/day: 1 pack/day for 9.0 years (9.0 ttl pk-yrs)    Types: Cigarettes    Start date: 11/22/2011    Quit date: 11/21/2020    Years since quitting: 2.3   Smokeless tobacco: Never  Vaping Use   Vaping status: Some Days   Substances: Nicotine  Substance and Sexual Activity   Alcohol use: No   Drug use: No   Sexual activity: Yes    Partners: Male    Birth control/protection: Abstinence  Other Topics Concern   Not on file  Social  History Narrative   Grandmother is irish/indian   2 half-sisters   2 sisters (full)   1/2 brother   Older brother same mom- brother has CP   3 1/2 younger brothers after me   1 daughter    Boyfriend   Works as a Futures trader   Completed associate degree in Theatre manager   No pets   Enjoys Clinical cytogeneticist   Social Drivers of Corporate investment banker Strain: Low Risk  (02/26/2023)   Overall Financial Resource Strain (CARDIA)    Difficulty of Paying Living Expenses: Not very hard  Food Insecurity: No Food Insecurity (02/26/2023)   Hunger Vital Sign    Worried About Running Out of Food in the Last Year: Never true    Ran Out of Food in the Last Year: Never true  Transportation Needs: No Transportation Needs (02/26/2023)   PRAPARE - Scientist, research (physical sciences) (Medical): No    Lack of Transportation (Non-Medical): No  Physical Activity: Insufficiently Active (02/26/2023)   Exercise Vital Sign    Days of Exercise per Week: 3 days    Minutes of Exercise per Session: 30 min  Stress: No Stress Concern Present (02/26/2023)   [employer] of Occupational Health - Occupational Stress Questionnaire    Feeling of Stress : Not at all  Social Connections: Socially Isolated (02/26/2023)   Social Connection and Isolation Panel [NHANES]    Frequency of Communication with Friends and Family: Once a week    Frequency of Social Gatherings with Friends and Family: Once a week    Attends Religious Services: Never    Database administrator or Organizations: No    Attends Engineer, structural: Not on file    Marital Status: Never married  Intimate Partner Violence: Not At Risk (07/10/2022)   Humiliation, Afraid, Rape, and Kick questionnaire    Fear of Current or Ex-Partner: No    Emotionally Abused: No    Physically Abused: No    Sexually Abused: No    Outpatient Medications Prior to Visit  Medication Sig Dispense Refill   albuterol (VENTOLIN HFA) 108 (90 Base) MCG/ACT inhaler Inhale 2 puffs into the lungs every 6 (six) hours as needed for wheezing or shortness of breath. 18 g 2   amLODipine (NORVASC) 10 MG tablet Take 1 tablet (10 mg total) by mouth daily. 90 tablet 1   buPROPion (WELLBUTRIN SR) 150 MG 12 hr tablet Take 1 tablet (150 mg total) by mouth daily. 30 tablet 0   carvedilol (COREG) 3.125 MG tablet Take 1 tablet (3.125 mg total) by mouth 2 (two) times daily with a meal. 60 tablet 0   Cholecalciferol (VITAMIN D3) 75 MCG (3000 UT) TABS Take 1 tablet by mouth daily at 6 (six) AM.     cyanocobalamin (VITAMIN B12) 1000 MCG tablet Take 1 tablet (1,000 mcg total) by mouth daily.     EPINEPHrine 0.3 mg/0.3 mL IJ SOAJ injection Inject 0.3 mg into the muscle as needed for anaphylaxis. 2 each 0   Fiber Adult Gummies 2 g CHEW Take a  directed on bottle     Iron, Ferrous Sulfate, 325 (65 Fe) MG TABS Take 325 mg by mouth every other day. 30 tablet    Multiple Vitamins-Minerals (MULTIVITAMIN WITH MINERALS) tablet Take 1 tablet by mouth daily.     ondansetron (ZOFRAN-ODT) 4 MG disintegrating tablet Take 1 tablet (4 mg total) by mouth every 8 (eight) hours as needed for nausea or  vomiting. 20 tablet 0   polyethylene glycol (MIRALAX / GLYCOLAX) 17 g packet Take 17 g by mouth 2 (two) times daily. 14 each 0   valsartan-hydrochlorothiazide (DIOVAN-HCT) 320-25 MG tablet Take 1 tablet by mouth daily. 90 tablet 1   Vitamin D, Ergocalciferol, (DRISDOL) 1.25 MG (50000 UNIT) CAPS capsule Take 1 capsule (50,000 Units total) by mouth every 7 (seven) days. 12 capsule 0   No facility-administered medications prior to visit.    Allergies  Allergen Reactions   Latex Other (See Comments)    Irritated and burning skin   Other Swelling and Other (See Comments)    Allergic to all fruit and raw tomatoes reaction face swells    ROS See HPI    Objective:    Physical Exam Constitutional:      General: She is not in acute distress.    Appearance: Normal appearance. She is well-developed.  HENT:     Head: Normocephalic and atraumatic.     Right Ear: External ear normal.     Left Ear: External ear normal.  Eyes:     General: No scleral icterus. Neck:     Thyroid: No thyromegaly.  Cardiovascular:     Rate and Rhythm: Normal rate and regular rhythm.     Heart sounds: Normal heart sounds. No murmur heard. Pulmonary:     Effort: Pulmonary effort is normal. No respiratory distress.     Breath sounds: Normal breath sounds. No wheezing.  Musculoskeletal:     Cervical back: Neck supple.  Skin:    General: Skin is warm and dry.  Neurological:     Mental Status: She is alert and oriented to person, place, and time.  Psychiatric:        Mood and Affect: Mood normal.        Behavior: Behavior normal.        Thought Content: Thought  content normal.        Judgment: Judgment normal.      BP 134/84 (BP Location: Left Arm, Patient Position: Sitting, Cuff Size: Large)   Pulse (!) 101   Temp 98.9 F (37.2 C) (Oral)   Resp 16   Ht 5\' 1"  (1.549 m)   Wt (!) 313 lb (142 kg)   SpO2 97%   BMI 59.14 kg/m  Wt Readings from Last 3 Encounters:  03/12/23 (!) 313 lb (142 kg)  01/13/23 (!) 316 lb (143.3 kg)  12/16/22 (!) 314 lb (142.4 kg)       Assessment & Plan:   Problem List Items Addressed This Visit       Unprioritized   Prediabetes   Lab Results  Component Value Date   HGBA1C 5.9 (H) 03/12/2023   A1C acceptable. Continue diet/exercise and weight loss efforts.       Iron deficiency   Lab Results  Component Value Date   WBC 9.4 01/13/2023   HGB 12.0 01/13/2023   HCT 37.7 01/13/2023   MCV 79.1 01/13/2023   PLT 430.0 (H) 01/13/2023   Blood count and iron levels stable. Continue iron supplement.       Hyperglycemia - Primary   Relevant Orders   Basic Metabolic Panel (BMET) (Completed)   HgB A1c (Completed)   Essential hypertension   BP Readings from Last 3 Encounters:  03/12/23 134/84  02/26/23 133/84  01/13/23 (!) 143/86   BP remains well controlled off of medication.  Continue to monitor.        I am having Lorretta Lein "Bailey"  maintain her multivitamin with minerals, albuterol, ondansetron, polyethylene glycol, Fiber Adult Gummies, cyanocobalamin, Iron (Ferrous Sulfate), Vitamin D (Ergocalciferol), carvedilol, amLODipine, buPROPion, EPINEPHrine, valsartan-hydrochlorothiazide, and Vitamin D3.  No orders of the defined types were placed in this encounter.

## 2023-03-13 ENCOUNTER — Other Ambulatory Visit: Payer: Self-pay | Admitting: Family

## 2023-03-13 ENCOUNTER — Encounter: Payer: Self-pay | Admitting: Family

## 2023-03-13 LAB — BASIC METABOLIC PANEL
BUN: 15 mg/dL (ref 7–25)
CO2: 26 mmol/L (ref 20–32)
Calcium: 8.8 mg/dL (ref 8.6–10.2)
Chloride: 102 mmol/L (ref 98–110)
Creat: 0.96 mg/dL (ref 0.50–0.99)
Glucose, Bld: 131 mg/dL — ABNORMAL HIGH (ref 65–99)
Potassium: 4 mmol/L (ref 3.5–5.3)
Sodium: 136 mmol/L (ref 135–146)

## 2023-03-13 LAB — HEMOGLOBIN A1C
Hgb A1c MFr Bld: 5.9 %{Hb} — ABNORMAL HIGH (ref <5.7)
Mean Plasma Glucose: 123 mg/dL
eAG (mmol/L): 6.8 mmol/L

## 2023-03-13 NOTE — Assessment & Plan Note (Signed)
 BP Readings from Last 3 Encounters:  03/12/23 134/84  02/26/23 133/84  01/13/23 (!) 143/86   BP remains well controlled off of medication.  Continue to monitor.

## 2023-03-13 NOTE — Assessment & Plan Note (Signed)
 Lab Results  Component Value Date   HGBA1C 5.9 (H) 03/12/2023   A1C acceptable. Continue diet/exercise and weight loss efforts.

## 2023-03-13 NOTE — Assessment & Plan Note (Signed)
 Lab Results  Component Value Date   WBC 9.4 01/13/2023   HGB 12.0 01/13/2023   HCT 37.7 01/13/2023   MCV 79.1 01/13/2023   PLT 430.0 (H) 01/13/2023   Blood count and iron levels stable. Continue iron supplement.

## 2023-03-13 NOTE — Patient Instructions (Signed)
 VISIT SUMMARY:  Cristina Adkins, you came in today for a follow-up on your blood pressure management and blood work. You are doing well with your current medication regimen for hypertension and have not experienced any concerning symptoms. We also discussed monitoring your diabetes and anemia.  YOUR PLAN:  -HYPERTENSION: Hypertension, or high blood pressure, is a condition where the force of the blood against your artery walls is too high. You are doing well with your current medication, so we will continue with the same management plan.  -ANEMIA: Anemia is a condition where you do not have enough healthy red blood cells to carry adequate oxygen to your body's tissues. Your most recent blood count was normal. Please continue your iron supplement.   -DIABETES: Diabetes is a condition that affects how your body processes blood sugar. We will check your A1c levels today to monitor your blood sugar control.  INSTRUCTIONS:  Please follow up with the lab tests as discussed. We will review the results and adjust your treatment plan if necessary.

## 2023-03-24 ENCOUNTER — Ambulatory Visit: Payer: No Typology Code available for payment source | Admitting: Internal Medicine

## 2023-03-29 ENCOUNTER — Encounter: Payer: Self-pay | Admitting: Family

## 2023-05-25 ENCOUNTER — Other Ambulatory Visit: Payer: Self-pay | Admitting: Family

## 2023-05-25 DIAGNOSIS — I10 Essential (primary) hypertension: Secondary | ICD-10-CM

## 2023-05-25 MED ORDER — CARVEDILOL 3.125 MG PO TABS: 3.1250 mg | ORAL_TABLET | Freq: Two times a day (BID) | ORAL | 1 refills | Status: DC

## 2023-07-20 ENCOUNTER — Ambulatory Visit: Admitting: Family

## 2023-07-20 ENCOUNTER — Emergency Department (HOSPITAL_BASED_OUTPATIENT_CLINIC_OR_DEPARTMENT_OTHER)

## 2023-07-20 ENCOUNTER — Other Ambulatory Visit: Payer: Self-pay

## 2023-07-20 ENCOUNTER — Inpatient Hospital Stay (HOSPITAL_BASED_OUTPATIENT_CLINIC_OR_DEPARTMENT_OTHER)
Admission: EM | Admit: 2023-07-20 | Discharge: 2023-07-26 | DRG: 758 | Disposition: A | Discharge status: Home / Self Care | Attending: Obstetrics and Gynecology | Admitting: Obstetrics and Gynecology

## 2023-07-20 VITALS — BP 141/86 | HR 99 | Temp 98.7°F | Resp 16 | Ht 61.0 in | Wt 318.4 lb

## 2023-07-20 DIAGNOSIS — K5730 Diverticulosis of large intestine without perforation or abscess without bleeding: Secondary | ICD-10-CM | POA: Diagnosis present

## 2023-07-20 DIAGNOSIS — Z87891 Personal history of nicotine dependence: Secondary | ICD-10-CM

## 2023-07-20 DIAGNOSIS — D72829 Elevated white blood cell count, unspecified: Secondary | ICD-10-CM | POA: Diagnosis present

## 2023-07-20 DIAGNOSIS — G4730 Sleep apnea, unspecified: Secondary | ICD-10-CM | POA: Diagnosis present

## 2023-07-20 DIAGNOSIS — N7093 Salpingitis and oophoritis, unspecified: Secondary | ICD-10-CM | POA: Diagnosis not present

## 2023-07-20 DIAGNOSIS — Z743 Need for continuous supervision: Secondary | ICD-10-CM | POA: Diagnosis not present

## 2023-07-20 DIAGNOSIS — R1084 Generalized abdominal pain: Secondary | ICD-10-CM

## 2023-07-20 DIAGNOSIS — Z79899 Other long term (current) drug therapy: Secondary | ICD-10-CM

## 2023-07-20 DIAGNOSIS — I10 Essential (primary) hypertension: Secondary | ICD-10-CM | POA: Diagnosis present

## 2023-07-20 DIAGNOSIS — Z91040 Latex allergy status: Secondary | ICD-10-CM

## 2023-07-20 DIAGNOSIS — R8281 Pyuria: Secondary | ICD-10-CM

## 2023-07-20 DIAGNOSIS — M62838 Other muscle spasm: Secondary | ICD-10-CM | POA: Diagnosis not present

## 2023-07-20 DIAGNOSIS — K5900 Constipation, unspecified: Secondary | ICD-10-CM | POA: Diagnosis present

## 2023-07-20 DIAGNOSIS — K219 Gastro-esophageal reflux disease without esophagitis: Secondary | ICD-10-CM | POA: Diagnosis present

## 2023-07-20 DIAGNOSIS — Z6841 Body Mass Index (BMI) 40.0 and over, adult: Secondary | ICD-10-CM

## 2023-07-20 DIAGNOSIS — Z8249 Family history of ischemic heart disease and other diseases of the circulatory system: Secondary | ICD-10-CM

## 2023-07-20 DIAGNOSIS — Z833 Family history of diabetes mellitus: Secondary | ICD-10-CM

## 2023-07-20 DIAGNOSIS — M79604 Pain in right leg: Secondary | ICD-10-CM | POA: Diagnosis not present

## 2023-07-20 DIAGNOSIS — K279 Peptic ulcer, site unspecified, unspecified as acute or chronic, without hemorrhage or perforation: Secondary | ICD-10-CM | POA: Diagnosis present

## 2023-07-20 LAB — POCT URINALYSIS DIP (MANUAL ENTRY)
Bilirubin, UA: NEGATIVE
Glucose, UA: NEGATIVE mg/dL
Ketones, POC UA: NEGATIVE mg/dL
Nitrite, UA: NEGATIVE
Spec Grav, UA: 1.01 (ref 1.010–1.025)
Urobilinogen, UA: 0.2 U/dL
pH, UA: 5 (ref 5.0–8.0)

## 2023-07-20 LAB — COMPREHENSIVE METABOLIC PANEL WITH GFR
ALT: 41 U/L (ref 0–44)
AST: 24 U/L (ref 15–41)
Albumin: 3.4 g/dL — ABNORMAL LOW (ref 3.5–5.0)
Alkaline Phosphatase: 138 U/L — ABNORMAL HIGH (ref 38–126)
Anion gap: 13 (ref 5–15)
BUN: 9 mg/dL (ref 6–20)
CO2: 24 mmol/L (ref 22–32)
Calcium: 9.1 mg/dL (ref 8.9–10.3)
Chloride: 103 mmol/L (ref 98–111)
Creatinine, Ser: 0.88 mg/dL (ref 0.44–1.00)
GFR, Estimated: >60 mL/min (ref >60)
Glucose, Bld: 113 mg/dL — ABNORMAL HIGH (ref 70–99)
Potassium: 4 mmol/L (ref 3.5–5.1)
Sodium: 140 mmol/L (ref 135–145)
Total Bilirubin: 0.3 mg/dL (ref 0.0–1.2)
Total Protein: 8.1 g/dL (ref 6.5–8.1)

## 2023-07-20 LAB — CBC
HCT: 32.1 % — ABNORMAL LOW (ref 36.0–46.0)
Hemoglobin: 10 g/dL — ABNORMAL LOW (ref 12.0–15.0)
MCH: 25.4 pg — ABNORMAL LOW (ref 26.0–34.0)
MCHC: 31.2 g/dL (ref 30.0–36.0)
MCV: 81.7 fL (ref 80.0–100.0)
Platelets: 612 10*3/uL — ABNORMAL HIGH (ref 150–400)
RBC: 3.93 MIL/uL (ref 3.87–5.11)
RDW: 18.9 % — ABNORMAL HIGH (ref 11.5–15.5)
WBC: 15.7 10*3/uL — ABNORMAL HIGH (ref 4.0–10.5)
nRBC: 0.2 % (ref 0.0–0.2)

## 2023-07-20 LAB — URINALYSIS, ROUTINE W REFLEX MICROSCOPIC
Bilirubin Urine: NEGATIVE
Glucose, UA: NEGATIVE mg/dL
Hgb urine dipstick: NEGATIVE
Ketones, ur: NEGATIVE mg/dL
Leukocytes,Ua: NEGATIVE
Nitrite: NEGATIVE
Protein, ur: NEGATIVE mg/dL
Specific Gravity, Urine: 1.02 (ref 1.005–1.030)
pH: 5.5 (ref 5.0–8.0)

## 2023-07-20 LAB — HCG, SERUM, QUALITATIVE: Preg, Serum: NEGATIVE

## 2023-07-20 LAB — LACTIC ACID, PLASMA: Lactic Acid, Venous: 0.8 mmol/L (ref 0.5–1.9)

## 2023-07-20 LAB — LIPASE, BLOOD: Lipase: 22 U/L (ref 11–51)

## 2023-07-20 MED ORDER — ONDANSETRON HCL 4 MG/2ML IJ SOLN
4.0000 mg | Freq: Once | INTRAMUSCULAR | Status: AC
Start: 2023-07-20 — End: 2023-07-20
  Administered 2023-07-20: 4 mg via INTRAVENOUS
  Filled 2023-07-20: qty 2

## 2023-07-20 MED ORDER — MORPHINE SULFATE (PF) 4 MG/ML IV SOLN
4.0000 mg | Freq: Once | INTRAVENOUS | Status: AC
  Administered 2023-07-20: 4 mg via INTRAVENOUS
  Filled 2023-07-20: qty 1

## 2023-07-20 MED ORDER — SODIUM CHLORIDE 0.9 % IV BOLUS
1000.0000 mL | Freq: Once | INTRAVENOUS | Status: AC
Start: 2023-07-20 — End: 2023-07-21
  Administered 2023-07-20: 1000 mL via INTRAVENOUS

## 2023-07-20 MED ORDER — CEFTRIAXONE SODIUM 2 G IJ SOLR
2.0000 g | Freq: Once | INTRAMUSCULAR | Status: AC
  Administered 2023-07-21: 2 g via INTRAVENOUS
  Filled 2023-07-20: qty 20

## 2023-07-20 MED ORDER — METRONIDAZOLE 500 MG/100ML IV SOLN: 500.0000 mg | Freq: Once | INTRAVENOUS | Status: DC

## 2023-07-20 MED ORDER — SODIUM CHLORIDE 0.9 % IV SOLN: 100.0000 mg | Freq: Once | INTRAVENOUS | Status: DC

## 2023-07-20 MED ORDER — IOHEXOL 300 MG/ML  SOLN
125.0000 mL | Freq: Once | INTRAMUSCULAR | Status: AC | PRN
  Administered 2023-07-20: 125 mL via INTRAVENOUS

## 2023-07-20 NOTE — Assessment & Plan Note (Addendum)
-   Constant lower abdominal pain for two weeks - Pain worsens during bowel movements - has hx of pyelonephritis- states this feels similar. UA notes large blood she has trace blood but is on her menses.  Also notes trace leukocytes. Has been sent for culture. - Due to severity of her pain and duration of pain- I have advised her to go to the ER now.  It is 6:30 PM and outpatient imaging is closed.   -She was brought down to the ER by CMA. Report given to [facility] ED provider on duty.

## 2023-07-20 NOTE — ED Provider Notes (Signed)
 [place] [facility] Provider Note   CSN: [id number] Arrival date & time: 07/20/23  8161     Patient presents with: Abdominal Pain (lower)   Cristina Adkins is a 43 y.o. female.  Hypertension, GERD, sleep apnea presents to emergency room with complaint of abdominal pain.  Patient reports that for approximately 3 weeks she has noted lower abdominal pain, dysuria intermittently and distended swollen feeling of abdomen.  Overall reports she feels quite poorly.  Has not had fever or chills at home.  Notes no nausea, no vomiting but has noted constipation.  Sexually active with one partner, denies vaginal discharge, currently on her period. Has had mild dysuria.     Abdominal Pain      Prior to Admission medications   Medication Sig Start Date End Date Taking? Authorizing Provider  albuterol  (VENTOLIN  HFA) 108 (90 Base) MCG/ACT inhaler Inhale 2 puffs into the lungs every 6 (six) hours as needed for wheezing or shortness of breath. 05/18/22   Lorin Norris, MD  amLODipine  (NORVASC ) 10 MG tablet Take 1 tablet (10 mg total) by mouth daily. 01/13/23   O'Sullivan, Melissa, NP  buPROPion  (WELLBUTRIN  SR) 150 MG 12 hr tablet Take 1 tablet (150 mg total) by mouth daily. 01/13/23   O'Sullivan, Melissa, NP  carvedilol  (COREG ) 3.125 MG tablet Take 1 tablet (3.125 mg total) by mouth 2 (two) times daily with a meal. 05/25/23   Daryl Setter, NP  Cholecalciferol (VITAMIN D3) 75 MCG (3000 UT) TABS Take 1 tablet by mouth daily at 6 (six) AM. 01/13/23   Daryl Setter, NP  cyanocobalamin  (VITAMIN B12) 1000 MCG tablet Take 1 tablet (1,000 mcg total) by mouth daily. 08/04/22   Daryl Setter, NP  EPINEPHrine  0.3 mg/0.3 mL IJ SOAJ injection Inject 0.3 mg into the muscle as needed for anaphylaxis. 01/13/23   Daryl Setter, NP  Fiber Adult Gummies 2 g CHEW Take a directed on bottle 07/14/22   Juvenal Raisin U, DO  Iron , Ferrous Sulfate , 325 (65 Fe) MG TABS  Take 325 mg by mouth every other day. 08/04/22   O'Sullivan, Melissa, NP  Multiple Vitamins-Minerals (MULTIVITAMIN WITH MINERALS) tablet Take 1 tablet by mouth daily. 08/13/21   O'Sullivan, Melissa, NP  ondansetron  (ZOFRAN -ODT) 4 MG disintegrating tablet Take 1 tablet (4 mg total) by mouth every 8 (eight) hours as needed for nausea or vomiting. 07/06/22   Dreama Longs, MD  polyethylene glycol (MIRALAX  / GLYCOLAX ) 17 g packet Take 17 g by mouth 2 (two) times daily. 07/14/22   Vann, Jessica U, DO  valsartan -hydrochlorothiazide  (DIOVAN -HCT) 320-25 MG tablet Take 1 tablet by mouth daily. 01/13/23   O'Sullivan, Melissa, NP  Vitamin D , Ergocalciferol , (DRISDOL ) 1.25 MG (50000 UNIT) CAPS capsule Take 1 capsule (50,000 Units total) by mouth every 7 (seven) days. 08/05/22   O'Sullivan, Melissa, NP    Allergies: Latex and Other    Review of Systems  Gastrointestinal:  Positive for abdominal pain.    Updated Vital Signs BP (!) 147/101 (BP Location: Left Wrist)   Pulse (!) 101   Temp 97.7 F (36.5 C) (Oral)   Resp (!) 22   LMP 07/14/2023   SpO2 98%   Physical Exam Vitals and nursing note reviewed.  Constitutional:      General: She is not in acute distress.    Appearance: She is not toxic-appearing.  HENT:     Head: Normocephalic and atraumatic.   Eyes:     General: No scleral icterus.  Conjunctiva/sclera: Conjunctivae normal.    Cardiovascular:     Rate and Rhythm: Normal rate and regular rhythm.     Pulses: Normal pulses.     Heart sounds: Normal heart sounds.  Pulmonary:     Effort: Pulmonary effort is normal. No respiratory distress.     Breath sounds: Normal breath sounds.  Abdominal:     General: Abdomen is flat. Bowel sounds are normal.     Palpations: Abdomen is soft.     Tenderness: There is abdominal tenderness in the right lower quadrant, suprapubic area and left lower quadrant. There is no right CVA tenderness or left CVA tenderness.   Skin:    General: Skin is warm  and dry.     Findings: No lesion.   Neurological:     General: No focal deficit present.     Mental Status: She is alert and oriented to person, place, and time. Mental status is at baseline.     (all labs ordered are listed, but only abnormal results are displayed) Labs Reviewed  COMPREHENSIVE METABOLIC PANEL WITH GFR - Abnormal; Notable for the following components:      Result Value   Glucose, Bld 113 (*)    Albumin 3.4 (*)    Alkaline Phosphatase 138 (*)    All other components within normal limits  CBC - Abnormal; Notable for the following components:   WBC 15.7 (*)    Hemoglobin 10.0 (*)    HCT 32.1 (*)    MCH 25.4 (*)    RDW 18.9 (*)    Platelets 612 (*)    All other components within normal limits  WET PREP, GENITAL  LIPASE, BLOOD  LACTIC ACID, PLASMA  HCG, SERUM, QUALITATIVE  URINALYSIS, ROUTINE W REFLEX MICROSCOPIC  GC/CHLAMYDIA PROBE AMP ([place]) NOT AT [facility]    EKG: None  Radiology: US  PELVIC COMPLETE W TRANSVAGINAL AND TORSION R/O Result Date: 07/20/2023 CLINICAL DATA:  Left lower quadrant pain. Question left tubal ovarian abscess. EXAM: TRANSABDOMINAL AND TRANSVAGINAL ULTRASOUND OF PELVIS DOPPLER ULTRASOUND OF OVARIES TECHNIQUE: Both transabdominal and transvaginal ultrasound examinations of the pelvis were performed. Transabdominal technique was performed for global imaging of the pelvis including uterus, ovaries, adnexal regions, and pelvic cul-de-sac. It was necessary to proceed with endovaginal exam following the transabdominal exam to visualize the ovaries and endometrium. Color and duplex Doppler ultrasound was utilized to evaluate blood flow to the ovaries. COMPARISON:  CT abdomen and pelvis 07/20/2023 FINDINGS: Uterus Measurements: 11.2 x 6.1 x 7.2 cm = volume: 256 mL. No fibroids or other mass visualized. Endometrium Thickness: 11 mm.  No focal abnormality visualized. Right ovary Not identified Left ovary Measurements: 8.2 x 5.5 x 6.6 cm = volume: 156  mL. Diffusely heterogeneous. Complex low-attenuation areas are seen scattered throughout the left ovary with the largest measuring 2.6 x 3.0 x 2.9 cm. Inferior to the left ovary there is a complex fluid collection containing multiple septations measuring 8.0 x 4.4 x 6.0 cm. Pulsed Doppler evaluation of the left ovary demonstrates normal low-resistance arterial and venous waveforms. Other findings There is a complex fluid collection versus loculated fluid collection in the cul-de-sac containing multiple septations. IMPRESSION: 1. Enlarged heterogeneous left ovary with complex low-attenuation areas scattered throughout the left ovary measuring up to 3.0 cm. Findings are favored to represent pyosalpinx/tubo-ovarian abscess. 2. Complex fluid collection containing multiple septations inferior to the left ovary measuring up to 8.0 cm, possible abscess or pyosalpinx. 3. Complex fluid collection versus loculated fluid collection in the  cul-de-sac containing multiple septations. 4. Right ovary not identified. 5. No evidence of left ovarian torsion. Electronically Signed   By: Greig Pique M.D.   On: 07/20/2023 22:33   CT ABDOMEN PELVIS W CONTRAST Result Date: 07/20/2023 CLINICAL DATA:  Lower abdominal pain EXAM: CT ABDOMEN AND PELVIS WITH CONTRAST TECHNIQUE: Multidetector CT imaging of the abdomen and pelvis was performed using the standard protocol following bolus administration of intravenous contrast. RADIATION DOSE REDUCTION: This exam was performed according to the departmental dose-optimization program which includes automated exposure control, adjustment of the mA and/or kV according to patient size and/or use of iterative reconstruction technique. CONTRAST:  OMNIPAQUE  IOHEXOL  300 MG/ML  SOLN COMPARISON:  07/06/2022 FINDINGS: Lower chest: No acute abnormality Hepatobiliary: No focal hepatic abnormality. Gallbladder unremarkable. Pancreas: No focal abnormality or ductal dilatation. Spleen: No focal  abnormality.  Normal size. Adrenals/Urinary Tract: No adrenal abnormality. No focal renal abnormality. No stones or hydronephrosis. Urinary bladder is unremarkable. Stomach/Bowel: Colonic diverticulosis. No active diverticulitis. Stomach and small bowel decompressed. No bowel obstruction. Vascular/Lymphatic: Aorta normal caliber. Mildly prominent retroperitoneal lymph nodes measuring up to 9 mm in short axis diameter, likely reactive. Reproductive: Uterus and right ovary unremarkable. Complex cystic mass noted within the left adnexa measuring 8.6 x 6.8 cm. This is concerning for tubo-ovarian abscess. Surrounding haziness/inflammation. Other: Moderate free fluid in the cul-de-sac. Fluid appears complex with enhancing rim or peritoneum. Musculoskeletal: No acute bony abnormality. IMPRESSION: 8.6 x 6.8 cm complex cystic area in the left adnexa with surrounding inflammation. Findings concerning for tubo-ovarian abscess. Complex free fluid in the cul-de-sac where there appears to be enhancing peritoneum or rim. Electronically Signed   By: Franky Crease M.D.   On: 07/20/2023 21:15     Procedures   Medications Ordered in the ED  cefTRIAXone (ROCEPHIN) 2 g in sodium chloride  0.9 % 100 mL IVPB (has no administration in time range)  doxycycline (VIBRAMYCIN) 100 mg in sodium chloride  0.9 % 250 mL IVPB (has no administration in time range)  metroNIDAZOLE  (FLAGYL ) IVPB 500 mg (has no administration in time range)  morphine  (PF) 4 MG/ML injection 4 mg (4 mg Intravenous Given 07/20/23 2041)  ondansetron  (ZOFRAN ) injection 4 mg (4 mg Intravenous Given 07/20/23 2041)  sodium chloride  0.9 % bolus 1,000 mL (1,000 mLs Intravenous New Bag/Given 07/20/23 2042)  iohexol  (OMNIPAQUE ) 300 MG/ML solution 125 mL (125 mLs Intravenous Contrast Given 07/20/23 2052)    Clinical Course as of 07/20/23 2323  Tue Jul 20, 2023  2239 IV abx --  rocephin 2g, 500 flagyl , 100 doxy 1st women and children.  Dr Eveline to admit.  [JB]     Clinical Course User Index [JB] Ryan Palermo, Warren SAILOR, PA-C                                 Medical Decision Making Amount and/or Complexity of Data Reviewed Labs: ordered. Radiology: ordered.  Risk Prescription drug management. Decision regarding hospitalization.   This patient presents to the ED for concern of lower abdominal pain, this involves an extensive number of treatment options, and is a complaint that carries with it a high risk of complications and morbidity.  The differential diagnosis includes pelvic inflammatory disease, STD, diverticulitis, appendicitis, pancreatitis, urinary tract infection   Co morbidities that complicate the patient evaluation  Hypertension, obesity   Additional history obtained:  Additional history obtained from 07/20/2023 ED visit with primary care sent here for further  workup due to abdominal pain.   Lab Tests:  I personally interpreted labs.  The pertinent results include:   CBC with leukocytosis of 15 Alk Phos 138 Hcg negative UA negative    Imaging Studies ordered:  I ordered imaging studies including CT scan I independently visualized and interpreted imaging which showed 8.6 x 6.8 cm complex cystic area in the left adnexa with surrounding  inflammation. Findings concerning for tubo-ovarian abscess. Complex free fluid in the cul-de-sac where there appears to be enhancing peritoneum or rim. I agree with the radiologist interpretation Getting pelvic ultrasound to further characterize.    Cardiac Monitoring: / EKG:  The patient was maintained on a cardiac monitor.   Consultations Obtained:  I requested consultation with the OBGYN,  and discussed lab and imaging findings as well as pertinent plan - they recommend: Admission and IV antibiotics.  Dr. Eveline will be admitting provider.   Problem List / ED Course / Critical interventions / Medication management  Patient presents with severe abdominal pain.  This has been ongoing  for about 3 weeks.  She denies any vaginal discharge.  She is tachycardic, mildly increased respiratory rate and has elevated white blood cell count of 15.  On my exam she has tenderness to palpation to lower abdomen.  She had UA done with primary care earlier that was negative.  She is not pregnant.  She is sexually active but with 1 partner.  She declines pelvic exam.  Offered wet prep and she is considering. CT scan shows left tubo-ovarian abscess approximately 8 cm with surrounding inflammation.  Will order pelvic ultrasound to further characterize. Pelvic ultrasound shows left 3 cm tubo-ovarian abscess with surrounding 8 cm abscess. Discussed with OBGYN, they will admit. I ordered IV abx, patient to go to [facility] admission.  I ordered medication including zofran , morphine , normal saline Reevaluation of the patient after these medicines showed that the patient improved I have reviewed the patients home medicines and have made adjustments as needed       Final diagnoses:  Tubo-ovarian abscess    ED Discharge Orders     None          Shermon Warren SAILOR, PA-C 07/20/23 2326    Ruthe Cornet, DO 07/21/23 1457

## 2023-07-20 NOTE — Progress Notes (Signed)
 Subjective:     Patient ID: Cristina Adkins, female    DOB: 1980-04-22, 43 y.o.   MRN: [id number]  Chief Complaint  Patient presents with   lower abdominal pain   no appetite-2 weeks    HPI  Discussed the use of AI scribe software for clinical note transcription with the patient, who gave verbal consent to proceed.  History of Present Illness  Cristina Adkins is a 43 year old female with chronic constipation who presents with a two-week history of abdominal pain.  Abdominal pain - Constant lower abdominal pain for two weeks - Pain worsens during bowel movements - Associated with sensation of pressure and nerve tightening - Tightness and bubbling sensation in the abdomen  Constipation - Chronic constipation managed with laxatives, MiraLAX , and suppositories - Minimal relief from current regimen - Passes only small amounts of watery liquid stool - Last significant bowel movement at the beginning of the month - Concern for severe constipation recurrence  Mucus discharge and urinary symptoms - Recent episode of mucus discharge from her rectum without straining, staining underwear - Cramping after urination - Self-treatment with over-the-counter AZO.       Health Maintenance Due  Topic Date Due   DTaP/Tdap/Td (1 - Tdap) Never done   Pneumococcal Vaccine 31-75 Years old (1 of 2 - PCV) Never done   Hepatitis B Vaccines (1 of 3 - 19+ 3-dose series) Never done   HPV VACCINES (1 - 3-dose SCDM series) Never done   COVID-19 Vaccine (1 - 2024-25 season) Never done    Past Medical History:  Diagnosis Date   Anemia    Back pain    Chest pain    Constipation    GERD (gastroesophageal reflux disease)    History of alcohol abuse    Hypertension    Hypokalemia    Iron  deficiency    Lactose intolerance    Mini stroke    Sleep apnea    Swelling of lower extremity    Vitamin B 12 deficiency    Vitamin D  deficiency     Past Surgical History:  Procedure  Laterality Date   COLONOSCOPY WITH ESOPHAGOGASTRODUODENOSCOPY (EGD)  09/08/2022   [facility] [place], [place] [address]    Family History  Problem Relation Age of Onset   Hypertension Mother    Alcoholism Mother    Drug abuse Mother    Prostate cancer Father    Hypertension Brother    Hypertension Maternal Grandmother    Diabetes Daughter    Lung cancer Daughter    Heart disease Maternal Uncle    Diabetes Maternal Uncle    Colon cancer Maternal Uncle     Social History   Socioeconomic History   Marital status: Single    Spouse name: Not on file   Number of children: 1   Years of education: Not on file   Highest education level: Associate degree: academic program  Occupational History   Occupation: Advice worker  Tobacco Use   Smoking status: Former    Current packs/day: 0.00    Average packs/day: 1 pack/day for 9.0 years (9.0 ttl pk-yrs)    Types: Cigarettes    Start date: 11/22/2011    Quit date: 11/21/2020    Years since quitting: 2.6   Smokeless tobacco: Never  Vaping Use   Vaping status: Some Days   Substances: Nicotine  Substance and Sexual Activity   Alcohol use: No   Drug use: No   Sexual activity: Yes  Partners: Male    Birth control/protection: Abstinence  Other Topics Concern   Not on file  Social History Narrative   Grandmother is irish/indian   2 half-sisters   2 sisters (full)   1/2 brother   Older brother same mom- brother has CP   3 1/2 younger brothers after me   1 daughter    Boyfriend   Works as a Futures trader   Completed associate degree in Theatre manager   No pets   Enjoys Clinical cytogeneticist   Social Drivers of Corporate investment banker Strain: Low Risk  (07/20/2023)   Overall Financial Resource Strain (CARDIA)    Difficulty of Paying Living Expenses: Not hard at all  Food Insecurity: No Food Insecurity (07/20/2023)   Hunger Vital Sign    Worried About Running Out of Food in the Last Year: Never  true    Ran Out of Food in the Last Year: Never true  Transportation Needs: No Transportation Needs (07/20/2023)   PRAPARE - Administrator, Civil Service (Medical): No    Lack of Transportation (Non-Medical): No  Physical Activity: Insufficiently Active (07/20/2023)   Exercise Vital Sign    Days of Exercise per Week: 4 days    Minutes of Exercise per Session: 20 min  Stress: No Stress Concern Present (07/20/2023)   [employer] of Occupational Health - Occupational Stress Questionnaire    Feeling of Stress: Only a little  Social Connections: Unknown (07/20/2023)   Social Connection and Isolation Panel    Frequency of Communication with Friends and Family: Once a week    Frequency of Social Gatherings with Friends and Family: Once a week    Attends Religious Services: Never    Database administrator or Organizations: No    Attends Engineer, structural: Not on file    Marital Status: Patient declined  Intimate Partner Violence: Not At Risk (07/10/2022)   Humiliation, Afraid, Rape, and Kick questionnaire    Fear of Current or Ex-Partner: No    Emotionally Abused: No    Physically Abused: No    Sexually Abused: No    Outpatient Medications Prior to Visit  Medication Sig Dispense Refill   albuterol  (VENTOLIN  HFA) 108 (90 Base) MCG/ACT inhaler Inhale 2 puffs into the lungs every 6 (six) hours as needed for wheezing or shortness of breath. 18 g 2   amLODipine  (NORVASC ) 10 MG tablet Take 1 tablet (10 mg total) by mouth daily. 90 tablet 1   buPROPion  (WELLBUTRIN  SR) 150 MG 12 hr tablet Take 1 tablet (150 mg total) by mouth daily. 30 tablet 0   carvedilol  (COREG ) 3.125 MG tablet Take 1 tablet (3.125 mg total) by mouth 2 (two) times daily with a meal. 60 tablet 1   Cholecalciferol (VITAMIN D3) 75 MCG (3000 UT) TABS Take 1 tablet by mouth daily at 6 (six) AM.     cyanocobalamin  (VITAMIN B12) 1000 MCG tablet Take 1 tablet (1,000 mcg total) by mouth daily.     EPINEPHrine   0.3 mg/0.3 mL IJ SOAJ injection Inject 0.3 mg into the muscle as needed for anaphylaxis. 2 each 0   Fiber Adult Gummies 2 g CHEW Take a directed on bottle     Iron , Ferrous Sulfate , 325 (65 Fe) MG TABS Take 325 mg by mouth every other day. 30 tablet    Multiple Vitamins-Minerals (MULTIVITAMIN WITH MINERALS) tablet Take 1 tablet by mouth daily.     ondansetron  (ZOFRAN -ODT) 4 MG disintegrating tablet  Take 1 tablet (4 mg total) by mouth every 8 (eight) hours as needed for nausea or vomiting. 20 tablet 0   polyethylene glycol (MIRALAX  / GLYCOLAX ) 17 g packet Take 17 g by mouth 2 (two) times daily. 14 each 0   valsartan -hydrochlorothiazide  (DIOVAN -HCT) 320-25 MG tablet Take 1 tablet by mouth daily. 90 tablet 1   Vitamin D , Ergocalciferol , (DRISDOL ) 1.25 MG (50000 UNIT) CAPS capsule Take 1 capsule (50,000 Units total) by mouth every 7 (seven) days. 12 capsule 0   No facility-administered medications prior to visit.    Allergies  Allergen Reactions   Latex Other (See Comments)    Irritated and burning skin   Other Swelling and Other (See Comments)    Allergic to all fruit and raw tomatoes reaction face swells    ROS    See HPI Objective:    Physical Exam Constitutional:      General: She is in acute distress.     Appearance: Normal appearance. She is well-developed.     Comments: Appears to be in pain  HENT:     Head: Normocephalic and atraumatic.     Right Ear: External ear normal.     Left Ear: External ear normal.   Eyes:     General: No scleral icterus.  Neck:     Thyroid : No thyromegaly.   Cardiovascular:     Rate and Rhythm: Normal rate and regular rhythm.     Heart sounds: Normal heart sounds. No murmur heard. Pulmonary:     Effort: Pulmonary effort is normal. No respiratory distress.     Breath sounds: Normal breath sounds. No wheezing.  Abdominal:     Comments: Protuberant, distended, generalized tenderness throughout abdomen.  No guarding. Exam limited by habitus    Musculoskeletal:     Cervical back: Neck supple.   Skin:    General: Skin is warm and dry.   Neurological:     Mental Status: She is alert and oriented to person, place, and time.   Psychiatric:        Mood and Affect: Mood normal.        Behavior: Behavior normal.        Thought Content: Thought content normal.        Judgment: Judgment normal.      BP (!) 141/86 (BP Location: Left Wrist, Patient Position: Sitting, Cuff Size: Normal)   Pulse 99   Temp 98.7 F (37.1 C) (Oral)   Resp 16   Ht 5' 1 (1.549 m)   Wt (!) 318 lb 6.4 oz (144.4 kg)   LMP 07/14/2023   SpO2 99%   BMI 60.16 kg/m  Wt Readings from Last 3 Encounters:  07/20/23 (!) 318 lb 6.4 oz (144.4 kg)  03/12/23 (!) 313 lb (142 kg)  01/13/23 (!) 316 lb (143.3 kg)       Assessment & Plan:   Problem List Items Addressed This Visit       Unprioritized   Generalized abdominal pain - Primary    - Constant lower abdominal pain for two weeks - Pain worsens during bowel movements - has hx of pyelonephritis- states this feels similar. UA notes large blood she has trace blood but is on her menses.  Also notes trace leukocytes. Has been sent for culture. - Due to severity of her pain and duration of pain- I have advised her to go to the ER now.  It is 6:30 PM and outpatient imaging is closed.   -She was  brought down to the ER by CMA. Report given to [facility] ED provider on duty.      Relevant Orders   POCT urinalysis dipstick (Completed)   Urine Culture   Other Visit Diagnoses       Pyuria       Relevant Orders   Urine Culture       I am having Amaiah Ileana Aquas maintain her multivitamin with minerals, albuterol , ondansetron , polyethylene glycol, Fiber Adult Gummies, cyanocobalamin , Iron  (Ferrous Sulfate ), Vitamin D  (Ergocalciferol ), amLODipine , buPROPion , EPINEPHrine , valsartan -hydrochlorothiazide , Vitamin D3, and carvedilol .  No orders of the defined types were placed in this encounter.

## 2023-07-20 NOTE — ED Triage Notes (Signed)
 Lower abd pain , Hx same last year when she got admitted for sepsis . Obvious discomfort . Denies urinary symptoms . Last BM 3 weeks ago. Distended abdomen.

## 2023-07-21 ENCOUNTER — Encounter (HOSPITAL_COMMUNITY): Payer: Self-pay | Admitting: Obstetrics & Gynecology

## 2023-07-21 DIAGNOSIS — Z79899 Other long term (current) drug therapy: Secondary | ICD-10-CM | POA: Diagnosis not present

## 2023-07-21 DIAGNOSIS — Z87891 Personal history of nicotine dependence: Secondary | ICD-10-CM | POA: Diagnosis not present

## 2023-07-21 DIAGNOSIS — K219 Gastro-esophageal reflux disease without esophagitis: Secondary | ICD-10-CM | POA: Diagnosis present

## 2023-07-21 DIAGNOSIS — M62838 Other muscle spasm: Secondary | ICD-10-CM | POA: Diagnosis not present

## 2023-07-21 DIAGNOSIS — I10 Essential (primary) hypertension: Secondary | ICD-10-CM | POA: Diagnosis present

## 2023-07-21 DIAGNOSIS — D72829 Elevated white blood cell count, unspecified: Secondary | ICD-10-CM | POA: Diagnosis present

## 2023-07-21 DIAGNOSIS — Z6841 Body Mass Index (BMI) 40.0 and over, adult: Secondary | ICD-10-CM | POA: Diagnosis not present

## 2023-07-21 DIAGNOSIS — K5730 Diverticulosis of large intestine without perforation or abscess without bleeding: Secondary | ICD-10-CM | POA: Diagnosis present

## 2023-07-21 DIAGNOSIS — K279 Peptic ulcer, site unspecified, unspecified as acute or chronic, without hemorrhage or perforation: Secondary | ICD-10-CM | POA: Diagnosis present

## 2023-07-21 DIAGNOSIS — G4730 Sleep apnea, unspecified: Secondary | ICD-10-CM | POA: Diagnosis present

## 2023-07-21 DIAGNOSIS — Z833 Family history of diabetes mellitus: Secondary | ICD-10-CM | POA: Diagnosis not present

## 2023-07-21 DIAGNOSIS — K5900 Constipation, unspecified: Secondary | ICD-10-CM | POA: Diagnosis present

## 2023-07-21 DIAGNOSIS — M79604 Pain in right leg: Secondary | ICD-10-CM | POA: Diagnosis not present

## 2023-07-21 DIAGNOSIS — Z91040 Latex allergy status: Secondary | ICD-10-CM | POA: Diagnosis not present

## 2023-07-21 DIAGNOSIS — N7093 Salpingitis and oophoritis, unspecified: Secondary | ICD-10-CM | POA: Diagnosis present

## 2023-07-21 DIAGNOSIS — Z8249 Family history of ischemic heart disease and other diseases of the circulatory system: Secondary | ICD-10-CM | POA: Diagnosis not present

## 2023-07-21 LAB — URINE CULTURE
MICRO NUMBER:: 16618297
Result:: NO GROWTH
SPECIMEN QUALITY:: ADEQUATE

## 2023-07-21 LAB — WET PREP, GENITAL
Clue Cells Wet Prep HPF POC: NONE SEEN
Sperm: NONE SEEN
Trich, Wet Prep: NONE SEEN
WBC, Wet Prep HPF POC: <10 (ref <10)
Yeast Wet Prep HPF POC: NONE SEEN

## 2023-07-21 LAB — GC/CHLAMYDIA PROBE AMP (~~LOC~~) NOT AT ARMC
Chlamydia: NEGATIVE
Comment: NEGATIVE
Comment: NORMAL
Neisseria Gonorrhea: NEGATIVE

## 2023-07-21 MED ORDER — HYDROMORPHONE HCL 1 MG/ML IJ SOLN
1.0000 mg | INTRAMUSCULAR | Status: DC | PRN
  Administered 2023-07-22 – 2023-07-24 (×7): 1 mg via INTRAVENOUS
  Filled 2023-07-21 (×7): qty 1

## 2023-07-21 MED ORDER — PRENATAL MULTIVITAMIN CH
1.0000 | ORAL_TABLET | Freq: Every day | ORAL | Status: DC
  Administered 2023-07-21 – 2023-07-26 (×4): 1 via ORAL
  Filled 2023-07-21 (×4): qty 1

## 2023-07-21 MED ORDER — SODIUM CHLORIDE 0.9 % IV SOLN: 250.0000 mL | INTRAVENOUS | Status: AC | PRN

## 2023-07-21 MED ORDER — METRONIDAZOLE 500 MG/100ML IV SOLN
500.0000 mg | Freq: Two times a day (BID) | INTRAVENOUS | Status: DC
  Administered 2023-07-21 – 2023-07-22 (×3): 500 mg via INTRAVENOUS
  Filled 2023-07-21 (×3): qty 100

## 2023-07-21 MED ORDER — ONDANSETRON HCL 4 MG/2ML IJ SOLN
4.0000 mg | Freq: Four times a day (QID) | INTRAMUSCULAR | Status: DC | PRN
  Administered 2023-07-22 (×2): 4 mg via INTRAVENOUS
  Filled 2023-07-21 (×2): qty 2

## 2023-07-21 MED ORDER — CARVEDILOL 3.125 MG PO TABS
3.1250 mg | ORAL_TABLET | Freq: Two times a day (BID) | ORAL | Status: DC
  Administered 2023-07-21 – 2023-07-26 (×11): 3.125 mg via ORAL
  Filled 2023-07-21 (×13): qty 1

## 2023-07-21 MED ORDER — SODIUM CHLORIDE 0.9% FLUSH: 3.0000 mL | INTRAVENOUS | Status: DC | PRN

## 2023-07-21 MED ORDER — VALSARTAN-HYDROCHLOROTHIAZIDE 320-25 MG PO TABS: 1.0000 | ORAL_TABLET | Freq: Every day | ORAL | Status: DC

## 2023-07-21 MED ORDER — OXYCODONE-ACETAMINOPHEN 5-325 MG PO TABS
1.0000 | ORAL_TABLET | ORAL | Status: DC | PRN
  Administered 2023-07-21: 2 via ORAL
  Administered 2023-07-21: 1 via ORAL
  Administered 2023-07-21 – 2023-07-26 (×16): 2 via ORAL
  Administered 2023-07-26: 1 via ORAL
  Filled 2023-07-21 (×8): qty 2
  Filled 2023-07-21: qty 1
  Filled 2023-07-21 (×8): qty 2
  Filled 2023-07-21: qty 1
  Filled 2023-07-21: qty 2

## 2023-07-21 MED ORDER — IBUPROFEN 600 MG PO TABS: 600.0000 mg | ORAL_TABLET | Freq: Four times a day (QID) | ORAL | Status: DC | PRN

## 2023-07-21 MED ORDER — IRBESARTAN 300 MG PO TABS
300.0000 mg | ORAL_TABLET | Freq: Every day | ORAL | Status: DC
  Administered 2023-07-21 – 2023-07-26 (×6): 300 mg via ORAL
  Filled 2023-07-21 (×6): qty 1

## 2023-07-21 MED ORDER — BUPROPION HCL ER (SR) 150 MG PO TB12
150.0000 mg | ORAL_TABLET | Freq: Every day | ORAL | Status: DC
  Administered 2023-07-21 – 2023-07-26 (×6): 150 mg via ORAL
  Filled 2023-07-21 (×6): qty 1

## 2023-07-21 MED ORDER — HYDROCHLOROTHIAZIDE 25 MG PO TABS
25.0000 mg | ORAL_TABLET | Freq: Every day | ORAL | Status: DC
  Administered 2023-07-21 – 2023-07-26 (×6): 25 mg via ORAL
  Filled 2023-07-21 (×6): qty 1

## 2023-07-21 MED ORDER — SODIUM CHLORIDE 0.9 % IV SOLN
2.0000 g | INTRAVENOUS | Status: DC
  Administered 2023-07-22 – 2023-07-25 (×5): 2 g via INTRAVENOUS
  Filled 2023-07-21 (×5): qty 20

## 2023-07-21 MED ORDER — SODIUM CHLORIDE 0.9% FLUSH
3.0000 mL | Freq: Two times a day (BID) | INTRAVENOUS | Status: DC
  Administered 2023-07-21 – 2023-07-26 (×11): 3 mL via INTRAVENOUS

## 2023-07-21 MED ORDER — SENNOSIDES-DOCUSATE SODIUM 8.6-50 MG PO TABS
1.0000 | ORAL_TABLET | Freq: Two times a day (BID) | ORAL | Status: DC
  Administered 2023-07-21 – 2023-07-26 (×11): 1 via ORAL
  Filled 2023-07-21 (×11): qty 1

## 2023-07-21 MED ORDER — ZOLPIDEM TARTRATE 5 MG PO TABS: 5.0000 mg | ORAL_TABLET | Freq: Every evening | ORAL | Status: DC | PRN

## 2023-07-21 MED ORDER — PANTOPRAZOLE SODIUM 20 MG PO TBEC
20.0000 mg | DELAYED_RELEASE_TABLET | Freq: Every day | ORAL | Status: DC
  Administered 2023-07-21 – 2023-07-22 (×2): 20 mg via ORAL
  Filled 2023-07-21 (×2): qty 1

## 2023-07-21 MED ORDER — AMLODIPINE BESYLATE 5 MG PO TABS
10.0000 mg | ORAL_TABLET | Freq: Every day | ORAL | Status: DC
  Administered 2023-07-21 – 2023-07-26 (×6): 10 mg via ORAL
  Filled 2023-07-21 (×6): qty 2

## 2023-07-21 MED ORDER — DOXYCYCLINE HYCLATE 100 MG PO TABS
100.0000 mg | ORAL_TABLET | Freq: Two times a day (BID) | ORAL | Status: DC
  Administered 2023-07-21 – 2023-07-26 (×12): 100 mg via ORAL
  Filled 2023-07-21 (×12): qty 1

## 2023-07-21 MED ORDER — ALBUTEROL SULFATE (2.5 MG/3ML) 0.083% IN NEBU: 2.5000 mg | INHALATION_SOLUTION | Freq: Four times a day (QID) | RESPIRATORY_TRACT | Status: DC | PRN

## 2023-07-21 NOTE — Plan of Care (Signed)

## 2023-07-21 NOTE — H&P (Signed)
 Cristina Adkins is an 43 y.o. female. G1P1001 Patient's last menstrual period was 07/14/2023. She presented to the ED at [facility] due to 3 weeks of abdominal pain that had worsened. No fever, notes lack of appetite but no emesis.  Pertinent Gynecological History: Menses: regular Contraception: none Last pap: normal Date: 02/2021 OB History: G1, P1   Menstrual History:  Patient's last menstrual period was 07/14/2023.    Past Medical History:  Diagnosis Date   Anemia    Back pain    Chest pain    Constipation    GERD (gastroesophageal reflux disease)    History of alcohol abuse    Hypertension    Hypokalemia    Iron  deficiency    Lactose intolerance    Mini stroke    Sleep apnea    Swelling of lower extremity    Vitamin B 12 deficiency    Vitamin D  deficiency     Past Surgical History:  Procedure Laterality Date   COLONOSCOPY WITH ESOPHAGOGASTRODUODENOSCOPY (EGD)  09/08/2022   [facility] [place], [place] [id number]    Family History  Problem Relation Age of Onset   Hypertension Mother    Alcoholism Mother    Drug abuse Mother    Prostate cancer Father    Hypertension Brother    Hypertension Maternal Grandmother    Diabetes Daughter    Lung cancer Daughter    Heart disease Maternal Uncle    Diabetes Maternal Uncle    Colon cancer Maternal Uncle     Social History:  reports that she quit smoking about 2 years ago. Her smoking use included cigarettes. She started smoking about 11 years ago. She has a 9 pack-year smoking history. She has never used smokeless tobacco. She reports that she does not drink alcohol and does not use drugs.  Allergies:  Allergies  Allergen Reactions   Latex Other (See Comments)    Irritated and burning skin   Other Swelling and Other (See Comments)    Allergic to all fruit and raw tomatoes reaction face swells    Medications Prior to Admission  Medication Sig Dispense Refill Last Dose/Taking   albuterol  (VENTOLIN  HFA) 108  (90 Base) MCG/ACT inhaler Inhale 2 puffs into the lungs every 6 (six) hours as needed for wheezing or shortness of breath. 18 g 2    amLODipine  (NORVASC ) 10 MG tablet Take 1 tablet (10 mg total) by mouth daily. 90 tablet 1    buPROPion  (WELLBUTRIN  SR) 150 MG 12 hr tablet Take 1 tablet (150 mg total) by mouth daily. 30 tablet 0    carvedilol  (COREG ) 3.125 MG tablet Take 1 tablet (3.125 mg total) by mouth 2 (two) times daily with a meal. 60 tablet 1    Cholecalciferol (VITAMIN D3) 75 MCG (3000 UT) TABS Take 1 tablet by mouth daily at 6 (six) AM.      cyanocobalamin  (VITAMIN B12) 1000 MCG tablet Take 1 tablet (1,000 mcg total) by mouth daily.      EPINEPHrine  0.3 mg/0.3 mL IJ SOAJ injection Inject 0.3 mg into the muscle as needed for anaphylaxis. 2 each 0    Fiber Adult Gummies 2 g CHEW Take a directed on bottle      Iron , Ferrous Sulfate , 325 (65 Fe) MG TABS Take 325 mg by mouth every other day. 30 tablet     Multiple Vitamins-Minerals (MULTIVITAMIN WITH MINERALS) tablet Take 1 tablet by mouth daily.      ondansetron  (ZOFRAN -ODT) 4 MG disintegrating tablet Take 1  tablet (4 mg total) by mouth every 8 (eight) hours as needed for nausea or vomiting. 20 tablet 0    polyethylene glycol (MIRALAX  / GLYCOLAX ) 17 g packet Take 17 g by mouth 2 (two) times daily. 14 each 0    valsartan -hydrochlorothiazide  (DIOVAN -HCT) 320-25 MG tablet Take 1 tablet by mouth daily. 90 tablet 1    Vitamin D , Ergocalciferol , (DRISDOL ) 1.25 MG (50000 UNIT) CAPS capsule Take 1 capsule (50,000 Units total) by mouth every 7 (seven) days. 12 capsule 0     Review of Systems  Blood pressure (!) 148/89, pulse 85, temperature 98.4 F (36.9 C), temperature source Oral, resp. rate (!) 22, last menstrual period 07/14/2023, SpO2 96%, unknown if currently breastfeeding. Physical Exam Vitals and nursing note reviewed. Exam conducted with a chaperone present.  Constitutional:      Appearance: She is obese.  HENT:     Head: Normocephalic  and atraumatic.   Cardiovascular:     Rate and Rhythm: Normal rate.  Pulmonary:     Effort: Pulmonary effort is normal.  Abdominal:     Palpations: Abdomen is soft.     Tenderness: There is abdominal tenderness in the right lower quadrant, suprapubic area and left lower quadrant.   Skin:    General: Skin is warm and dry.   Neurological:     General: No focal deficit present.     Mental Status: She is alert.     Results for orders placed or performed during the hospital encounter of 07/20/23 (from the past 24 hours)  Lipase, blood     Status: None   Collection Time: 07/20/23  7:00 PM  Result Value Ref Range   Lipase 22 11 - 51 U/L  Comprehensive metabolic panel     Status: Abnormal   Collection Time: 07/20/23  7:00 PM  Result Value Ref Range   Sodium 140 135 - 145 mmol/L   Potassium 4.0 3.5 - 5.1 mmol/L   Chloride 103 98 - 111 mmol/L   CO2 24 22 - 32 mmol/L   Glucose, Bld 113 (H) 70 - 99 mg/dL   BUN 9 6 - 20 mg/dL   Creatinine, Ser 9.11 0.44 - 1.00 mg/dL   Calcium 9.1 8.9 - 89.6 mg/dL   Total Protein 8.1 6.5 - 8.1 g/dL   Albumin 3.4 (L) 3.5 - 5.0 g/dL   AST 24 15 - 41 U/L   ALT 41 0 - 44 U/L   Alkaline Phosphatase 138 (H) 38 - 126 U/L   Total Bilirubin 0.3 0.0 - 1.2 mg/dL   GFR, Estimated >39 >39 mL/min   Anion gap 13 5 - 15  CBC     Status: Abnormal   Collection Time: 07/20/23  7:00 PM  Result Value Ref Range   WBC 15.7 (H) 4.0 - 10.5 K/uL   RBC 3.93 3.87 - 5.11 MIL/uL   Hemoglobin 10.0 (L) 12.0 - 15.0 g/dL   HCT 67.8 (L) 63.9 - 53.9 %   MCV 81.7 80.0 - 100.0 fL   MCH 25.4 (L) 26.0 - 34.0 pg   MCHC 31.2 30.0 - 36.0 g/dL   RDW 81.0 (H) 88.4 - 84.4 %   Platelets 612 (H) 150 - 400 K/uL   nRBC 0.2 0.0 - 0.2 %  hCG, serum, qualitative     Status: None   Collection Time: 07/20/23  7:00 PM  Result Value Ref Range   Preg, Serum NEGATIVE NEGATIVE  Lactic acid, plasma     Status: None  Collection Time: 07/20/23  7:34 PM  Result Value Ref Range   Lactic Acid,  Venous 0.8 0.5 - 1.9 mmol/L  Urinalysis, Routine w reflex microscopic -Urine, Clean Catch     Status: None   Collection Time: 07/20/23  9:52 PM  Result Value Ref Range   Color, Urine YELLOW YELLOW   APPearance CLEAR CLEAR   Specific Gravity, Urine 1.020 1.005 - 1.030   pH 5.5 5.0 - 8.0   Glucose, UA NEGATIVE NEGATIVE mg/dL   Hgb urine dipstick NEGATIVE NEGATIVE   Bilirubin Urine NEGATIVE NEGATIVE   Ketones, ur NEGATIVE NEGATIVE mg/dL   Protein, ur NEGATIVE NEGATIVE mg/dL   Nitrite NEGATIVE NEGATIVE   Leukocytes,Ua NEGATIVE NEGATIVE    US  PELVIC COMPLETE W TRANSVAGINAL AND TORSION R/O Result Date: 07/20/2023 CLINICAL DATA:  Left lower quadrant pain. Question left tubal ovarian abscess. EXAM: TRANSABDOMINAL AND TRANSVAGINAL ULTRASOUND OF PELVIS DOPPLER ULTRASOUND OF OVARIES TECHNIQUE: Both transabdominal and transvaginal ultrasound examinations of the pelvis were performed. Transabdominal technique was performed for global imaging of the pelvis including uterus, ovaries, adnexal regions, and pelvic cul-de-sac. It was necessary to proceed with endovaginal exam following the transabdominal exam to visualize the ovaries and endometrium. Color and duplex Doppler ultrasound was utilized to evaluate blood flow to the ovaries. COMPARISON:  CT abdomen and pelvis 07/20/2023 FINDINGS: Uterus Measurements: 11.2 x 6.1 x 7.2 cm = volume: 256 mL. No fibroids or other mass visualized. Endometrium Thickness: 11 mm.  No focal abnormality visualized. Right ovary Not identified Left ovary Measurements: 8.2 x 5.5 x 6.6 cm = volume: 156 mL. Diffusely heterogeneous. Complex low-attenuation areas are seen scattered throughout the left ovary with the largest measuring 2.6 x 3.0 x 2.9 cm. Inferior to the left ovary there is a complex fluid collection containing multiple septations measuring 8.0 x 4.4 x 6.0 cm. Pulsed Doppler evaluation of the left ovary demonstrates normal low-resistance arterial and venous waveforms. Other  findings There is a complex fluid collection versus loculated fluid collection in the cul-de-sac containing multiple septations. IMPRESSION: 1. Enlarged heterogeneous left ovary with complex low-attenuation areas scattered throughout the left ovary measuring up to 3.0 cm. Findings are favored to represent pyosalpinx/tubo-ovarian abscess. 2. Complex fluid collection containing multiple septations inferior to the left ovary measuring up to 8.0 cm, possible abscess or pyosalpinx. 3. Complex fluid collection versus loculated fluid collection in the cul-de-sac containing multiple septations. 4. Right ovary not identified. 5. No evidence of left ovarian torsion. Electronically Signed   By: Greig Pique M.D.   On: 07/20/2023 22:33   CT ABDOMEN PELVIS W CONTRAST Result Date: 07/20/2023 CLINICAL DATA:  Lower abdominal pain EXAM: CT ABDOMEN AND PELVIS WITH CONTRAST TECHNIQUE: Multidetector CT imaging of the abdomen and pelvis was performed using the standard protocol following bolus administration of intravenous contrast. RADIATION DOSE REDUCTION: This exam was performed according to the departmental dose-optimization program which includes automated exposure control, adjustment of the mA and/or kV according to patient size and/or use of iterative reconstruction technique. CONTRAST:  OMNIPAQUE  IOHEXOL  300 MG/ML  SOLN COMPARISON:  07/06/2022 FINDINGS: Lower chest: No acute abnormality Hepatobiliary: No focal hepatic abnormality. Gallbladder unremarkable. Pancreas: No focal abnormality or ductal dilatation. Spleen: No focal abnormality.  Normal size. Adrenals/Urinary Tract: No adrenal abnormality. No focal renal abnormality. No stones or hydronephrosis. Urinary bladder is unremarkable. Stomach/Bowel: Colonic diverticulosis. No active diverticulitis. Stomach and small bowel decompressed. No bowel obstruction. Vascular/Lymphatic: Aorta normal caliber. Mildly prominent retroperitoneal lymph nodes measuring up to 9 mm in  short axis diameter, likely reactive. Reproductive: Uterus and right ovary unremarkable. Complex cystic mass noted within the left adnexa measuring 8.6 x 6.8 cm. This is concerning for tubo-ovarian abscess. Surrounding haziness/inflammation. Other: Moderate free fluid in the cul-de-sac. Fluid appears complex with enhancing rim or peritoneum. Musculoskeletal: No acute bony abnormality. IMPRESSION: 8.6 x 6.8 cm complex cystic area in the left adnexa with surrounding inflammation. Findings concerning for tubo-ovarian abscess. Complex free fluid in the cul-de-sac where there appears to be enhancing peritoneum or rim. Electronically Signed   By: Franky Crease M.D.   On: 07/20/2023 21:15    Assessment/Plan: Tubo-ovarian abscess, admission for IV Abx  Lynwood Solomons 07/21/2023, 12:36 AM

## 2023-07-22 ENCOUNTER — Ambulatory Visit: Payer: Self-pay | Admitting: Family

## 2023-07-22 LAB — CBC
HCT: 30.8 % — ABNORMAL LOW (ref 36.0–46.0)
Hemoglobin: 9.2 g/dL — ABNORMAL LOW (ref 12.0–15.0)
MCH: 25.1 pg — ABNORMAL LOW (ref 26.0–34.0)
MCHC: 29.9 g/dL — ABNORMAL LOW (ref 30.0–36.0)
MCV: 84.2 fL (ref 80.0–100.0)
Platelets: 569 10*3/uL — ABNORMAL HIGH (ref 150–400)
RBC: 3.66 MIL/uL — ABNORMAL LOW (ref 3.87–5.11)
RDW: 19.3 % — ABNORMAL HIGH (ref 11.5–15.5)
WBC: 15 10*3/uL — ABNORMAL HIGH (ref 4.0–10.5)
nRBC: 0 % (ref 0.0–0.2)

## 2023-07-22 LAB — NO BLOOD PRODUCTS

## 2023-07-22 MED ORDER — METRONIDAZOLE 500 MG PO TABS
500.0000 mg | ORAL_TABLET | Freq: Two times a day (BID) | ORAL | Status: DC
  Administered 2023-07-22 – 2023-07-26 (×8): 500 mg via ORAL
  Filled 2023-07-22 (×8): qty 1

## 2023-07-22 MED ORDER — MAGNESIUM CITRATE PO SOLN
1.0000 | Freq: Once | ORAL | Status: AC
  Administered 2023-07-22: 1 via ORAL
  Filled 2023-07-22: qty 296

## 2023-07-22 MED ORDER — PANTOPRAZOLE SODIUM 20 MG PO TBEC
20.0000 mg | DELAYED_RELEASE_TABLET | Freq: Every day | ORAL | Status: DC
  Administered 2023-07-23 – 2023-07-26 (×4): 20 mg via ORAL
  Filled 2023-07-22 (×4): qty 1

## 2023-07-22 NOTE — Progress Notes (Signed)
 Subjective: Patient reports improvement in her right lower quadrant pain. She has not had a bowel thus far. Patient reports decrease appetite and acid reflux    Objective: Blood pressure 131/78, pulse 72, temperature 97.8 F (36.6 C), temperature source Oral, resp. rate 17, height 5' 1 (1.549 m), weight (!) 144.4 kg, last menstrual period 07/14/2023, SpO2 100%, unknown if currently breastfeeding.  GENERAL: Well-developed, well-nourished female in no acute distress.  LUNGS: Clear to auscultation bilaterally.  HEART: Regular rate and rhythm. ABDOMEN: Soft, nontender, nondistended. No organomegaly. PELVIC: Not performed EXTREMITIES: No cyanosis, clubbing, or edema, 2+ distal pulses.     Latest Ref Rng & Units 07/22/2023    5:23 AM 07/20/2023    7:00 PM 01/13/2023    4:21 PM  CBC  WBC 4.0 - 10.5 K/uL 15.0  15.7  9.4   Hemoglobin 12.0 - 15.0 g/dL 9.2  89.9  87.9   Hematocrit 36.0 - 46.0 % 30.8  32.1  37.7   Platelets 150 - 400 K/uL 569  612  430.0      Assessment/Plan: 43 yo admitted with TOA and constipation - Patient afebrile since admission - Will continue IV antibiotics - Consider IR drainage - Continue bowel regimen will add magnesium  citrate this morning   LOS: 1 day    Winton Felt, MD 07/22/2023, 7:22 AM

## 2023-07-22 NOTE — Plan of Care (Signed)

## 2023-07-23 ENCOUNTER — Inpatient Hospital Stay (HOSPITAL_COMMUNITY)

## 2023-07-23 DIAGNOSIS — K5900 Constipation, unspecified: Secondary | ICD-10-CM | POA: Diagnosis present

## 2023-07-23 LAB — CBC
HCT: 29.4 % — ABNORMAL LOW (ref 36.0–46.0)
Hemoglobin: 9 g/dL — ABNORMAL LOW (ref 12.0–15.0)
MCH: 25.9 pg — ABNORMAL LOW (ref 26.0–34.0)
MCHC: 30.6 g/dL (ref 30.0–36.0)
MCV: 84.5 fL (ref 80.0–100.0)
Platelets: 611 10*3/uL — ABNORMAL HIGH (ref 150–400)
RBC: 3.48 MIL/uL — ABNORMAL LOW (ref 3.87–5.11)
RDW: 19.2 % — ABNORMAL HIGH (ref 11.5–15.5)
WBC: 14.2 10*3/uL — ABNORMAL HIGH (ref 4.0–10.5)
nRBC: 0 % (ref 0.0–0.2)

## 2023-07-23 LAB — PROTIME-INR
INR: 1.2 (ref 0.8–1.2)
Prothrombin Time: 15.7 s — ABNORMAL HIGH (ref 11.4–15.2)

## 2023-07-23 MED ORDER — POLYETHYLENE GLYCOL 3350 17 G PO PACK
17.0000 g | PACK | Freq: Two times a day (BID) | ORAL | Status: DC
  Administered 2023-07-23 – 2023-07-26 (×7): 17 g via ORAL
  Filled 2023-07-23 (×7): qty 1

## 2023-07-23 MED ORDER — FENTANYL CITRATE (PF) 100 MCG/2ML IJ SOLN
INTRAMUSCULAR | Status: AC
  Filled 2023-07-23: qty 2

## 2023-07-23 MED ORDER — MIDAZOLAM HCL 2 MG/2ML IJ SOLN
INTRAMUSCULAR | Status: AC
Start: 2023-07-23 — End: 2023-07-23
  Filled 2023-07-23: qty 2

## 2023-07-23 MED ORDER — FENTANYL CITRATE (PF) 100 MCG/2ML IJ SOLN
INTRAMUSCULAR | Status: AC | PRN
  Administered 2023-07-23 (×2): 25 ug via INTRAVENOUS
  Administered 2023-07-23: 50 ug via INTRAVENOUS

## 2023-07-23 MED ORDER — FERROUS SULFATE 325 (65 FE) MG PO TABS
325.0000 mg | ORAL_TABLET | ORAL | Status: DC
  Administered 2023-07-23 – 2023-07-25 (×2): 325 mg via ORAL
  Filled 2023-07-23 (×2): qty 1

## 2023-07-23 MED ORDER — MIDAZOLAM HCL 2 MG/2ML IJ SOLN
INTRAMUSCULAR | Status: AC | PRN
  Administered 2023-07-23: 1 mg via INTRAVENOUS
  Administered 2023-07-23: .5 mg via INTRAVENOUS
  Administered 2023-07-23: 1 mg via INTRAVENOUS

## 2023-07-23 MED ORDER — MIDAZOLAM HCL 2 MG/2ML IJ SOLN
INTRAMUSCULAR | Status: AC
  Filled 2023-07-23: qty 2

## 2023-07-23 MED ORDER — VITAMIN D 25 MCG (1000 UNIT) PO TABS
3000.0000 [IU] | ORAL_TABLET | Freq: Every day | ORAL | Status: DC
  Administered 2023-07-23 – 2023-07-26 (×4): 3000 [IU] via ORAL
  Filled 2023-07-23 (×5): qty 3

## 2023-07-23 MED ORDER — SODIUM CHLORIDE 0.9% FLUSH
5.0000 mL | Freq: Three times a day (TID) | INTRAVENOUS | Status: DC
  Administered 2023-07-23 – 2023-07-26 (×8): 5 mL

## 2023-07-23 MED ORDER — VITAMIN B-12 1000 MCG PO TABS
1000.0000 ug | ORAL_TABLET | Freq: Every day | ORAL | Status: DC
  Administered 2023-07-23 – 2023-07-26 (×4): 1000 ug via ORAL
  Filled 2023-07-23 (×4): qty 1

## 2023-07-23 MED ORDER — CYCLOBENZAPRINE HCL 10 MG PO TABS
10.0000 mg | ORAL_TABLET | Freq: Three times a day (TID) | ORAL | Status: DC | PRN
  Administered 2023-07-23 – 2023-07-24 (×3): 10 mg via ORAL
  Filled 2023-07-23 (×3): qty 1

## 2023-07-23 NOTE — Consult Note (Signed)
 Chief Complaint: Patient was seen in consultation today for  Chief Complaint  Patient presents with   Abdominal Pain    lower   Referring Physician(s): Dr. Winton Felt  Supervising Physician: Hughes Simmonds  Patient Status: [facility] - In-pt  History of Present Illness: Cristina Adkins is a 43 y.o. female with a medical history significant for HTN and sleep apnea who presented to the ED 07/20/23 with complaints of 3 week's of worsening abdominal pain, dysuria and lower extremity edema. Work up in the ED was notable for leukocytosis and CT showing a complex cystic area in the left adnexa concerning for tubo-ovarian abscess. A pelvic ultrasound confirmed a 3 cm tubo-ovarian abscess with surrounding 8 cm abscess. She was admitted to GYN and treated conservatively with IV antibiotics and supportive care.   Pelvic US  07/20/23 IMPRESSION: 1. Enlarged heterogeneous left ovary with complex low-attenuation areas scattered throughout the left ovary measuring up to 3.0 cm. Findings are favored to represent pyosalpinx/tubo-ovarian abscess. 2. Complex fluid collection containing multiple septations inferior to the left ovary measuring up to 8.0 cm, possible abscess or pyosalpinx. 3. Complex fluid collection versus loculated fluid collection in the cul-de-sac containing multiple septations. 4. Right ovary not identified. 5. No evidence of left ovarian torsion.  The patient has not had much improvement in her symptoms and IR was consulted for aspiration/drain placement. Imaging reviewed and procedure approved by Dr. Jennefer.   Past Medical History:  Diagnosis Date   Anemia    Back pain    Chest pain    Constipation    GERD (gastroesophageal reflux disease)    History of alcohol abuse    Hypertension    Hypokalemia    Iron  deficiency    Lactose intolerance    Mini stroke    Sleep apnea    Swelling of lower extremity    Vitamin B 12 deficiency    Vitamin D  deficiency     Past  Surgical History:  Procedure Laterality Date   COLONOSCOPY WITH ESOPHAGOGASTRODUODENOSCOPY (EGD)  09/08/2022   [facility] [place], [place] [address]    Allergies: Latex and Other  Medications: Prior to Admission medications   Medication Sig Start Date End Date Taking? Authorizing Provider  albuterol  (VENTOLIN  HFA) 108 (90 Base) MCG/ACT inhaler Inhale 2 puffs into the lungs every 6 (six) hours as needed for wheezing or shortness of breath. 05/18/22   Lorin Norris, MD  amLODipine  (NORVASC ) 10 MG tablet Take 1 tablet (10 mg total) by mouth daily. 01/13/23   O'Sullivan, Melissa, NP  buPROPion  (WELLBUTRIN  SR) 150 MG 12 hr tablet Take 1 tablet (150 mg total) by mouth daily. 01/13/23   O'Sullivan, Melissa, NP  carvedilol  (COREG ) 3.125 MG tablet Take 1 tablet (3.125 mg total) by mouth 2 (two) times daily with a meal. 05/25/23   Daryl Setter, NP  Cholecalciferol (VITAMIN D3) 75 MCG (3000 UT) TABS Take 1 tablet by mouth daily at 6 (six) AM. 01/13/23   Daryl Setter, NP  cyanocobalamin  (VITAMIN B12) 1000 MCG tablet Take 1 tablet (1,000 mcg total) by mouth daily. 08/04/22   O'Sullivan, Melissa, NP  EPINEPHrine  0.3 mg/0.3 mL IJ SOAJ injection Inject 0.3 mg into the muscle as needed for anaphylaxis. 01/13/23   Daryl Setter, NP  Fiber Adult Gummies 2 g CHEW Take a directed on bottle 07/14/22   Juvenal Raisin U, DO  Iron , Ferrous Sulfate , 325 (65 Fe) MG TABS Take 325 mg by mouth every other day. 08/04/22   O'Sullivan, Melissa, NP  Multiple Vitamins-Minerals (MULTIVITAMIN WITH MINERALS) tablet Take 1 tablet by mouth daily. 08/13/21   O'Sullivan, Melissa, NP  ondansetron  (ZOFRAN -ODT) 4 MG disintegrating tablet Take 1 tablet (4 mg total) by mouth every 8 (eight) hours as needed for nausea or vomiting. 07/06/22   Dreama Longs, MD  polyethylene glycol (MIRALAX  / GLYCOLAX ) 17 g packet Take 17 g by mouth 2 (two) times daily. 07/14/22   Vann, Jessica U, DO  valsartan -hydrochlorothiazide   (DIOVAN -HCT) 320-25 MG tablet Take 1 tablet by mouth daily. 01/13/23   O'Sullivan, Melissa, NP  Vitamin D , Ergocalciferol , (DRISDOL ) 1.25 MG (50000 UNIT) CAPS capsule Take 1 capsule (50,000 Units total) by mouth every 7 (seven) days. 08/05/22   Daryl Setter, NP     Family History  Problem Relation Age of Onset   Hypertension Mother    Alcoholism Mother    Drug abuse Mother    Prostate cancer Father    Hypertension Brother    Hypertension Maternal Grandmother    Diabetes Daughter    Lung cancer Daughter    Heart disease Maternal Uncle    Diabetes Maternal Uncle    Colon cancer Maternal Uncle     Social History   Socioeconomic History   Marital status: Single    Spouse name: Not on file   Number of children: 1   Years of education: Not on file   Highest education level: Associate degree: academic program  Occupational History   Occupation: Advice worker  Tobacco Use   Smoking status: Former    Current packs/day: 0.00    Average packs/day: 1 pack/day for 9.0 years (9.0 ttl pk-yrs)    Types: Cigarettes    Start date: 11/22/2011    Quit date: 11/21/2020    Years since quitting: 2.6   Smokeless tobacco: Never  Vaping Use   Vaping status: Some Days   Substances: Nicotine  Substance and Sexual Activity   Alcohol use: No   Drug use: No   Sexual activity: Yes    Partners: Male    Birth control/protection: Abstinence  Other Topics Concern   Not on file  Social History Narrative   Grandmother is irish/indian   2 half-sisters   2 sisters (full)   1/2 brother   Older brother same mom- brother has CP   3 1/2 younger brothers after me   1 daughter    Boyfriend   Works as a Futures trader   Completed associate degree in Theatre manager   No pets   Enjoys Clinical cytogeneticist   Social Drivers of Corporate investment banker Strain: Low Risk  (07/20/2023)   Overall Financial Resource Strain (CARDIA)    Difficulty of Paying Living Expenses: Not  hard at all  Food Insecurity: No Food Insecurity (07/21/2023)   Hunger Vital Sign    Worried About Running Out of Food in the Last Year: Never true    Ran Out of Food in the Last Year: Never true  Transportation Needs: No Transportation Needs (07/21/2023)   PRAPARE - Administrator, Civil Service (Medical): No    Lack of Transportation (Non-Medical): No  Physical Activity: Insufficiently Active (07/20/2023)   Exercise Vital Sign    Days of Exercise per Week: 4 days    Minutes of Exercise per Session: 20 min  Stress: No Stress Concern Present (07/20/2023)   [employer] of Occupational Health - Occupational Stress Questionnaire    Feeling of Stress: Only a little  Social Connections: Unknown (07/20/2023)   Social  Connection and Isolation Panel    Frequency of Communication with Friends and Family: Once a week    Frequency of Social Gatherings with Friends and Family: Once a week    Attends Religious Services: Never    Database administrator or Organizations: No    Attends Engineer, structural: Not on file    Marital Status: Patient declined    Review of Systems: A 12 point ROS discussed and pertinent positives are indicated in the HPI above.  All other systems are negative.  Review of Systems  Gastrointestinal:  Positive for abdominal pain and constipation.  All other systems reviewed and are negative.   Vital Signs: BP (!) 143/97   Pulse 76   Temp 98.7 F (37.1 C) (Oral)   Resp 16   Ht 5' 1 (1.549 m)   Wt (!) 318 lb 6.4 oz (144.4 kg)   LMP 07/14/2023   SpO2 98%   BMI 60.16 kg/m   Physical Exam Constitutional:      General: She is not in acute distress.    Appearance: She is obese. She is not ill-appearing.  HENT:     Mouth/Throat:     Mouth: Mucous membranes are moist.     Pharynx: Oropharynx is clear.   Cardiovascular:     Rate and Rhythm: Normal rate.     Pulses: Normal pulses.  Pulmonary:     Effort: Pulmonary effort is normal.   Abdominal:     Tenderness: There is abdominal tenderness.   Skin:    General: Skin is warm and dry.   Neurological:     Mental Status: She is alert and oriented to person, place, and time.   Psychiatric:        Mood and Affect: Mood normal.        Behavior: Behavior normal.        Thought Content: Thought content normal.        Judgment: Judgment normal.     Imaging: US  PELVIC COMPLETE W TRANSVAGINAL AND TORSION R/O Result Date: 07/20/2023 CLINICAL DATA:  Left lower quadrant pain. Question left tubal ovarian abscess. EXAM: TRANSABDOMINAL AND TRANSVAGINAL ULTRASOUND OF PELVIS DOPPLER ULTRASOUND OF OVARIES TECHNIQUE: Both transabdominal and transvaginal ultrasound examinations of the pelvis were performed. Transabdominal technique was performed for global imaging of the pelvis including uterus, ovaries, adnexal regions, and pelvic cul-de-sac. It was necessary to proceed with endovaginal exam following the transabdominal exam to visualize the ovaries and endometrium. Color and duplex Doppler ultrasound was utilized to evaluate blood flow to the ovaries. COMPARISON:  CT abdomen and pelvis 07/20/2023 FINDINGS: Uterus Measurements: 11.2 x 6.1 x 7.2 cm = volume: 256 mL. No fibroids or other mass visualized. Endometrium Thickness: 11 mm.  No focal abnormality visualized. Right ovary Not identified Left ovary Measurements: 8.2 x 5.5 x 6.6 cm = volume: 156 mL. Diffusely heterogeneous. Complex low-attenuation areas are seen scattered throughout the left ovary with the largest measuring 2.6 x 3.0 x 2.9 cm. Inferior to the left ovary there is a complex fluid collection containing multiple septations measuring 8.0 x 4.4 x 6.0 cm. Pulsed Doppler evaluation of the left ovary demonstrates normal low-resistance arterial and venous waveforms. Other findings There is a complex fluid collection versus loculated fluid collection in the cul-de-sac containing multiple septations. IMPRESSION: 1. Enlarged heterogeneous  left ovary with complex low-attenuation areas scattered throughout the left ovary measuring up to 3.0 cm. Findings are favored to represent pyosalpinx/tubo-ovarian abscess. 2. Complex fluid collection containing multiple  septations inferior to the left ovary measuring up to 8.0 cm, possible abscess or pyosalpinx. 3. Complex fluid collection versus loculated fluid collection in the cul-de-sac containing multiple septations. 4. Right ovary not identified. 5. No evidence of left ovarian torsion. Electronically Signed   By: Greig Pique M.D.   On: 07/20/2023 22:33   CT ABDOMEN PELVIS W CONTRAST Result Date: 07/20/2023 CLINICAL DATA:  Lower abdominal pain EXAM: CT ABDOMEN AND PELVIS WITH CONTRAST TECHNIQUE: Multidetector CT imaging of the abdomen and pelvis was performed using the standard protocol following bolus administration of intravenous contrast. RADIATION DOSE REDUCTION: This exam was performed according to the departmental dose-optimization program which includes automated exposure control, adjustment of the mA and/or kV according to patient size and/or use of iterative reconstruction technique. CONTRAST:  OMNIPAQUE  IOHEXOL  300 MG/ML  SOLN COMPARISON:  07/06/2022 FINDINGS: Lower chest: No acute abnormality Hepatobiliary: No focal hepatic abnormality. Gallbladder unremarkable. Pancreas: No focal abnormality or ductal dilatation. Spleen: No focal abnormality.  Normal size. Adrenals/Urinary Tract: No adrenal abnormality. No focal renal abnormality. No stones or hydronephrosis. Urinary bladder is unremarkable. Stomach/Bowel: Colonic diverticulosis. No active diverticulitis. Stomach and small bowel decompressed. No bowel obstruction. Vascular/Lymphatic: Aorta normal caliber. Mildly prominent retroperitoneal lymph nodes measuring up to 9 mm in short axis diameter, likely reactive. Reproductive: Uterus and right ovary unremarkable. Complex cystic mass noted within the left adnexa measuring 8.6 x 6.8 cm. This  is concerning for tubo-ovarian abscess. Surrounding haziness/inflammation. Other: Moderate free fluid in the cul-de-sac. Fluid appears complex with enhancing rim or peritoneum. Musculoskeletal: No acute bony abnormality. IMPRESSION: 8.6 x 6.8 cm complex cystic area in the left adnexa with surrounding inflammation. Findings concerning for tubo-ovarian abscess. Complex free fluid in the cul-de-sac where there appears to be enhancing peritoneum or rim. Electronically Signed   By: Franky Crease M.D.   On: 07/20/2023 21:15    Labs:  CBC: Recent Labs    01/13/23 1621 07/20/23 1900 07/22/23 0523 07/23/23 0535  WBC 9.4 15.7* 15.0* 14.2*  HGB 12.0 10.0* 9.2* 9.0*  HCT 37.7 32.1* 30.8* 29.4*  PLT 430.0* 612* 569* 611*    COAGS: Recent Labs    07/23/23 0535  INR 1.2    BMP: Recent Labs    08/04/22 1515 11/05/22 1003 03/12/23 1624 07/20/23 1900  NA 138 139 136 140  K 4.2 4.6 4.0 4.0  CL 105 103 102 103  CO2 23 19* 26 24  GLUCOSE 99 110* 131* 113*  BUN 18 18 15 9   CALCIUM 8.8 9.5 8.8 9.1  CREATININE 1.04 1.05* 0.96 0.88  GFRNONAA  --   --   --  >60    LIVER FUNCTION TESTS: Recent Labs    08/04/22 1515 11/05/22 1003 07/20/23 1900  BILITOT 0.3 <0.2 0.3  AST 12 16 24   ALT 14 21 41  ALKPHOS 73 85 138*  PROT 7.2 7.2 8.1  ALBUMIN 3.8 3.7* 3.4*    TUMOR MARKERS: No results for input(s): AFPTM, CEA, CA199, CHROMGRNA in the last 8760 hours.  Assessment and Plan:  Tubo-ovarian abscess: Hanna Muzzy, 42 year old female, presents today for an image-guided aspiration with possible drain placement of a left tubo-ovarian abscess.  Risks and benefits discussed with the patient including bleeding, infection, damage to adjacent structures, bowel perforation/fistula connection, and sepsis.  All of the patient's questions were answered, patient is agreeable to proceed. She has been NPO.   Consent signed and in chart.  Thank you for this interesting consult.  I greatly  enjoyed meeting Padme Lewter and look forward to participating in their care.  A copy of this report was sent to the requesting provider on this date.  Electronically Signed: Warren Dais, AGACNP-BC 07/23/2023, 10:54 AM   I spent a total of 20 Minutes    in face to face in clinical consultation, greater than 50% of which was counseling/coordinating care for tubo-ovarian abscess

## 2023-07-23 NOTE — Progress Notes (Signed)
 Patient ID: Cristina Adkins, female   DOB: July 24, 1980, 43 y.o.   MRN: [id number]   Assessment/Plan: Essential hypertension Continue amlodipine   TOA (tubo-ovarian abscess) Versus diverticulitis is unclear which 1 of these is the primary cause however treatment would be the same. Continue antibiotics For IR drainage today  Constipation Severe.  Patient has not had a bowel movement since early June. She is status post enema and mag citrate yesterday. Patient report significant pain with trying to have a bowel movement which is partially why she has not completed a bowel movement.   Subjective: Interval History: Patient reports not much change in her symptoms.  She continues to have significant abdominal pain and feels very full from constipation.  Objective: Vital signs in last 24 hours: Temp:  [97.6 F (36.4 C)-98.7 F (37.1 C)] 98.7 F (37.1 C) (06/27 0811) Pulse Rate:  [67-80] 73 (06/27 0811) Resp:  [16-20] 20 (06/27 0811) BP: (129-150)/(75-94) 138/87 (06/27 0811) SpO2:  [95 %-98 %] 98 % (06/27 0811)  Intake/Output from previous day: 06/26 0701 - 06/27 0700 In: 43.6  Out: -  Intake/Output this shift: No intake/output data recorded.  General appearance: alert, cooperative, appears stated age, and morbidly obese Lungs: Normal effort Heart: regular rate and rhythm Abdomen: Soft, positive guarding, tender to palpation in lower quadrants bilaterally  Results for orders placed or performed during the hospital encounter of 07/20/23 (from the past 24 hours)  CBC     Status: Abnormal   Collection Time: 07/23/23  5:35 AM  Result Value Ref Range   WBC 14.2 (H) 4.0 - 10.5 K/uL   RBC 3.48 (L) 3.87 - 5.11 MIL/uL   Hemoglobin 9.0 (L) 12.0 - 15.0 g/dL   HCT 70.5 (L) 63.9 - 53.9 %   MCV 84.5 80.0 - 100.0 fL   MCH 25.9 (L) 26.0 - 34.0 pg   MCHC 30.6 30.0 - 36.0 g/dL   RDW 80.7 (H) 88.4 - 84.4 %   Platelets 611 (H) 150 - 400 K/uL   nRBC 0.0 0.0 - 0.2 %  Protime-INR     Status:  Abnormal   Collection Time: 07/23/23  5:35 AM  Result Value Ref Range   Prothrombin Time 15.7 (H) 11.4 - 15.2 seconds   INR 1.2 0.8 - 1.2    Studies/Results: US  PELVIC COMPLETE W TRANSVAGINAL AND TORSION R/O Result Date: 07/20/2023 CLINICAL DATA:  Left lower quadrant pain. Question left tubal ovarian abscess. EXAM: TRANSABDOMINAL AND TRANSVAGINAL ULTRASOUND OF PELVIS DOPPLER ULTRASOUND OF OVARIES TECHNIQUE: Both transabdominal and transvaginal ultrasound examinations of the pelvis were performed. Transabdominal technique was performed for global imaging of the pelvis including uterus, ovaries, adnexal regions, and pelvic cul-de-sac. It was necessary to proceed with endovaginal exam following the transabdominal exam to visualize the ovaries and endometrium. Color and duplex Doppler ultrasound was utilized to evaluate blood flow to the ovaries. COMPARISON:  CT abdomen and pelvis 07/20/2023 FINDINGS: Uterus Measurements: 11.2 x 6.1 x 7.2 cm = volume: 256 mL. No fibroids or other mass visualized. Endometrium Thickness: 11 mm.  No focal abnormality visualized. Right ovary Not identified Left ovary Measurements: 8.2 x 5.5 x 6.6 cm = volume: 156 mL. Diffusely heterogeneous. Complex low-attenuation areas are seen scattered throughout the left ovary with the largest measuring 2.6 x 3.0 x 2.9 cm. Inferior to the left ovary there is a complex fluid collection containing multiple septations measuring 8.0 x 4.4 x 6.0 cm. Pulsed Doppler evaluation of the left ovary demonstrates normal low-resistance arterial and venous waveforms. Other  findings There is a complex fluid collection versus loculated fluid collection in the cul-de-sac containing multiple septations. IMPRESSION: 1. Enlarged heterogeneous left ovary with complex low-attenuation areas scattered throughout the left ovary measuring up to 3.0 cm. Findings are favored to represent pyosalpinx/tubo-ovarian abscess. 2. Complex fluid collection containing multiple  septations inferior to the left ovary measuring up to 8.0 cm, possible abscess or pyosalpinx. 3. Complex fluid collection versus loculated fluid collection in the cul-de-sac containing multiple septations. 4. Right ovary not identified. 5. No evidence of left ovarian torsion. Electronically Signed   By: Greig Pique M.D.   On: 07/20/2023 22:33   CT ABDOMEN PELVIS W CONTRAST Result Date: 07/20/2023 CLINICAL DATA:  Lower abdominal pain EXAM: CT ABDOMEN AND PELVIS WITH CONTRAST TECHNIQUE: Multidetector CT imaging of the abdomen and pelvis was performed using the standard protocol following bolus administration of intravenous contrast. RADIATION DOSE REDUCTION: This exam was performed according to the departmental dose-optimization program which includes automated exposure control, adjustment of the mA and/or kV according to patient size and/or use of iterative reconstruction technique. CONTRAST:  OMNIPAQUE  IOHEXOL  300 MG/ML  SOLN COMPARISON:  07/06/2022 FINDINGS: Lower chest: No acute abnormality Hepatobiliary: No focal hepatic abnormality. Gallbladder unremarkable. Pancreas: No focal abnormality or ductal dilatation. Spleen: No focal abnormality.  Normal size. Adrenals/Urinary Tract: No adrenal abnormality. No focal renal abnormality. No stones or hydronephrosis. Urinary bladder is unremarkable. Stomach/Bowel: Colonic diverticulosis. No active diverticulitis. Stomach and small bowel decompressed. No bowel obstruction. Vascular/Lymphatic: Aorta normal caliber. Mildly prominent retroperitoneal lymph nodes measuring up to 9 mm in short axis diameter, likely reactive. Reproductive: Uterus and right ovary unremarkable. Complex cystic mass noted within the left adnexa measuring 8.6 x 6.8 cm. This is concerning for tubo-ovarian abscess. Surrounding haziness/inflammation. Other: Moderate free fluid in the cul-de-sac. Fluid appears complex with enhancing rim or peritoneum. Musculoskeletal: No acute bony abnormality.  IMPRESSION: 8.6 x 6.8 cm complex cystic area in the left adnexa with surrounding inflammation. Findings concerning for tubo-ovarian abscess. Complex free fluid in the cul-de-sac where there appears to be enhancing peritoneum or rim. Electronically Signed   By: Franky Crease M.D.   On: 07/20/2023 21:15    Scheduled Meds:  amLODipine   10 mg Oral Daily   buPROPion   150 mg Oral Daily   carvedilol   3.125 mg Oral BID WC   doxycycline   100 mg Oral Q12H   irbesartan   300 mg Oral Daily   And   hydrochlorothiazide   25 mg Oral Daily   metroNIDAZOLE   500 mg Oral Q12H   pantoprazole   20 mg Oral Daily   prenatal multivitamin  1 tablet Oral Q1200   senna-docusate  1 tablet Oral BID   sodium chloride  flush  3 mL Intravenous Q12H   Continuous Infusions:  cefTRIAXone  (ROCEPHIN )  IV 2 g (07/23/23 0012)   PRN Meds:albuterol , HYDROmorphone  (DILAUDID ) injection, ibuprofen , ondansetron  (ZOFRAN ) IV, oxyCODONE -acetaminophen , sodium chloride  flush, zolpidem     LOS: 2 days   Glenys GORMAN Birk, MD 07/23/2023 9:21 AM

## 2023-07-23 NOTE — Assessment & Plan Note (Signed)
 Severe.  Patient has not had a bowel movement since early June. She is status post enema and mag citrate. Patient was able to have a complete BM this morning.

## 2023-07-23 NOTE — Procedures (Signed)
 Vascular and Interventional Radiology Procedure Note  Patient: Cristina Adkins DOB: October 12, 1980 Medical Record Number: [id number] Note Date/Time: 07/23/23 11:09 AM   Performing Physician: Thom Hall, MD Assistant(s): None  Diagnosis: Pelvic abscess / TOA  Procedure: DRAINAGE CATHETER PLACEMENT into a PELVIC ABSCESS  Anesthesia: Conscious Sedation Complications: None Estimated Blood Loss: Minimal Specimens: Sent for Gram Stain, Aerobe Culture, and Anerobe Culture  Findings:  Successful CT-guided placement of 12 F catheter into a pelvic abscess.  Plan:  - Flush drain with 5 mL Normal Saline every 8 hours. - Follow up drain evaluation / sinogram in 7 day(s).  See detailed procedure note with images in PACS. The patient tolerated the procedure well without incident or complication and was returned to Recovery in stable condition.    Thom Hall, MD [facility] Radiology   Pager. [phone] Clinic. [phone]

## 2023-07-23 NOTE — Plan of Care (Signed)

## 2023-07-23 NOTE — Assessment & Plan Note (Signed)
 Versus diverticulitis is unclear which 1 of these is the primary cause however treatment would be the same. Continue antibiotics Status post IR drainage  Pain is significantly improved Culture appears to have no growth WBC trending down

## 2023-07-23 NOTE — Assessment & Plan Note (Signed)
 Continue amlodipine

## 2023-07-24 ENCOUNTER — Other Ambulatory Visit: Payer: Self-pay | Admitting: Family

## 2023-07-24 DIAGNOSIS — I10 Essential (primary) hypertension: Secondary | ICD-10-CM

## 2023-07-24 LAB — CBC
HCT: 29.4 % — ABNORMAL LOW (ref 36.0–46.0)
Hemoglobin: 9 g/dL — ABNORMAL LOW (ref 12.0–15.0)
MCH: 25.4 pg — ABNORMAL LOW (ref 26.0–34.0)
MCHC: 30.6 g/dL (ref 30.0–36.0)
MCV: 83.1 fL (ref 80.0–100.0)
Platelets: 633 10*3/uL — ABNORMAL HIGH (ref 150–400)
RBC: 3.54 MIL/uL — ABNORMAL LOW (ref 3.87–5.11)
RDW: 19.1 % — ABNORMAL HIGH (ref 11.5–15.5)
WBC: 13.4 10*3/uL — ABNORMAL HIGH (ref 4.0–10.5)
nRBC: 0 % (ref 0.0–0.2)

## 2023-07-24 MED ORDER — KETOROLAC TROMETHAMINE 30 MG/ML IJ SOLN
30.0000 mg | Freq: Four times a day (QID) | INTRAMUSCULAR | Status: AC
  Administered 2023-07-24 – 2023-07-26 (×8): 30 mg via INTRAVENOUS
  Filled 2023-07-24 (×8): qty 1

## 2023-07-24 NOTE — Progress Notes (Signed)
 Referring Physician(s): Dr. Winton Felt OBGYN  Supervising Physician: Hughes Simmonds  Patient Status:  [facility] - In-pt  Chief Complaint:  TOA S/p R TG drain placement by Dr. Hughes on 6/27   Subjective:  Patient laying in bed sleeping.  Reports that she feels little bit better than yesterday. No complaints today.   Allergies: Latex, Other, and Tomato  Medications: Prior to Admission medications   Medication Sig Start Date End Date Taking? Authorizing Provider  albuterol  (VENTOLIN  HFA) 108 (90 Base) MCG/ACT inhaler Inhale 2 puffs into the lungs every 6 (six) hours as needed for wheezing or shortness of breath. 05/18/22   Lorin Norris, MD  amLODipine  (NORVASC ) 10 MG tablet Take 1 tablet (10 mg total) by mouth daily. 01/13/23   O'Sullivan, Melissa, NP  buPROPion  (WELLBUTRIN  SR) 150 MG 12 hr tablet Take 1 tablet (150 mg total) by mouth daily. 01/13/23   O'Sullivan, Melissa, NP  carvedilol  (COREG ) 3.125 MG tablet Take 1 tablet (3.125 mg total) by mouth 2 (two) times daily with a meal. 05/25/23   Daryl Setter, NP  Cholecalciferol (VITAMIN D3) 75 MCG (3000 UT) TABS Take 1 tablet by mouth daily at 6 (six) AM. 01/13/23   Daryl Setter, NP  cyanocobalamin  (VITAMIN B12) 1000 MCG tablet Take 1 tablet (1,000 mcg total) by mouth daily. 08/04/22   Daryl Setter, NP  EPINEPHrine  0.3 mg/0.3 mL IJ SOAJ injection Inject 0.3 mg into the muscle as needed for anaphylaxis. 01/13/23   Daryl Setter, NP  Fiber Adult Gummies 2 g CHEW Take a directed on bottle 07/14/22   Juvenal Raisin U, DO  Iron , Ferrous Sulfate , 325 (65 Fe) MG TABS Take 325 mg by mouth every other day. 08/04/22   O'Sullivan, Melissa, NP  Multiple Vitamins-Minerals (MULTIVITAMIN WITH MINERALS) tablet Take 1 tablet by mouth daily. 08/13/21   O'Sullivan, Melissa, NP  ondansetron  (ZOFRAN -ODT) 4 MG disintegrating tablet Take 1 tablet (4 mg total) by mouth every 8 (eight) hours as needed for nausea or vomiting. 07/06/22    Dreama Longs, MD  polyethylene glycol (MIRALAX  / GLYCOLAX ) 17 g packet Take 17 g by mouth 2 (two) times daily. 07/14/22   Vann, Jessica U, DO  valsartan -hydrochlorothiazide  (DIOVAN -HCT) 320-25 MG tablet Take 1 tablet by mouth daily. 01/13/23   O'Sullivan, Melissa, NP  Vitamin D , Ergocalciferol , (DRISDOL ) 1.25 MG (50000 UNIT) CAPS capsule Take 1 capsule (50,000 Units total) by mouth every 7 (seven) days. 08/05/22   Daryl Setter, NP     Vital Signs: BP (!) 142/83 (BP Location: Right Wrist)   Pulse 78   Temp 98.3 F (36.8 C) (Oral)   Resp 18   Ht 5' 1 (1.549 m)   Wt (!) 318 lb 6.4 oz (144.4 kg)   LMP 07/14/2023   SpO2 96%   BMI 60.16 kg/m   Physical Exam Vitals reviewed.  HENT:     Head: Normocephalic and atraumatic.  Pulmonary:     Effort: Pulmonary effort is normal.   Skin:    General: Skin is warm and dry.     Coloration: Skin is not cyanotic or jaundiced.     Comments: Positive R TG drain to a gravity bag. Dressing is clean, dry, and intact. Trace of  serous fluid noted in the bag. Drain does not flush or aspirate.   Neurological:     Mental Status: She is alert.     Imaging: CT GUIDED PERITONEAL/RETROPERITONEAL FLUID DRAIN BY [facility] CATH Result Date: 07/23/2023 INDICATION: 10220 Abscess [id number] Briefly, 43 year old female  with tubo-ovarian abscess and deep pelvic fluid collection. EXAM: CT-GUIDED PELVIC ABSCESS DRAINAGE CATHETER PLACEMENT COMPARISON:  CT AP, 07/20/2023.  PELVIC ULTRASOUND, 07/20/2023. MEDICATIONS: The patient is currently admitted to the hospital and receiving intravenous antibiotics. The antibiotics were administered within an appropriate time frame prior to the initiation of the procedure. ANESTHESIA/SEDATION: Moderate (conscious) sedation was employed during this procedure. A total of Versed 2.5 mg and Fentanyl 5 1 mcg was administered intravenously. Moderate Sedation Time: 27 minutes. The patient's level of consciousness and vital signs were  monitored continuously by radiology nursing throughout the procedure under my direct supervision. CONTRAST:  None FLUOROSCOPY TIME:  CT dose; 1736 mGycm COMPLICATIONS: None immediate. PROCEDURE: RADIATION DOSE REDUCTION: This exam was performed according to the departmental dose-optimization program which includes automated exposure control, adjustment of the mA and/or kV according to patient size and/or use of iterative reconstruction technique. Informed written consent was obtained from the patient after a discussion of the risks, benefits and alternatives to treatment. The patient was placed prone on the CT gantry and a pre procedural CT was performed re-demonstrating the known abscess/fluid collection within the deep pelvis. The procedure was planned. A timeout was performed prior to the initiation of the procedure. The RIGHT gluteus was prepped and draped in the usual sterile fashion. The overlying soft tissues were anesthetized with 1% lidocaine with epinephrine . Appropriate trajectory was planned with the use of a 22 gauge spinal needle. An 18 gauge trocar needle was advanced into the abscess/fluid collection and a short Amplatz super stiff wire was coiled within the collection. Appropriate positioning was confirmed with a limited CT scan. The tract was serially dilated allowing placement of a 12 Fr Fr drainage catheter. Appropriate positioning was confirmed with a limited postprocedural CT scan. 10 mL of serous fluid was aspirated. The tube was connected to a drainage bag and sutured in place. A dressing was placed. The patient tolerated the procedure well without immediate post procedural complication. IMPRESSION: Successful CT-guided placement of a 12 Fr drainage catheter into the pelvis, via a RIGHT transgluteal approach, with aspiration of 10 mL of purulent fluid. Samples were sent to the laboratory as requested by the ordering clinical team. RECOMMENDATIONS: The patient will return to Vascular  Interventional Radiology (VIR) for routine drainage catheter evaluation and exchange in 5-7 days. Thom Hall, MD [facility] Electronically Signed   By: Thom Hall M.D.   On: 07/23/2023 17:06   US  PELVIC COMPLETE W TRANSVAGINAL AND TORSION R/O Result Date: 07/20/2023 CLINICAL DATA:  Left lower quadrant pain. Question left tubal ovarian abscess. EXAM: TRANSABDOMINAL AND TRANSVAGINAL ULTRASOUND OF PELVIS DOPPLER ULTRASOUND OF OVARIES TECHNIQUE: Both transabdominal and transvaginal ultrasound examinations of the pelvis were performed. Transabdominal technique was performed for global imaging of the pelvis including uterus, ovaries, adnexal regions, and pelvic cul-de-sac. It was necessary to proceed with endovaginal exam following the transabdominal exam to visualize the ovaries and endometrium. Color and duplex Doppler ultrasound was utilized to evaluate blood flow to the ovaries. COMPARISON:  CT abdomen and pelvis 07/20/2023 FINDINGS: Uterus Measurements: 11.2 x 6.1 x 7.2 cm = volume: 256 mL. No fibroids or other mass visualized. Endometrium Thickness: 11 mm.  No focal abnormality visualized. Right ovary Not identified Left ovary Measurements: 8.2 x 5.5 x 6.6 cm = volume: 156 mL. Diffusely heterogeneous. Complex low-attenuation areas are seen scattered throughout the left ovary with the largest measuring 2.6 x 3.0 x 2.9 cm. Inferior to the left ovary there is a  complex fluid collection containing multiple septations measuring 8.0 x 4.4 x 6.0 cm. Pulsed Doppler evaluation of the left ovary demonstrates normal low-resistance arterial and venous waveforms. Other findings There is a complex fluid collection versus loculated fluid collection in the cul-de-sac containing multiple septations. IMPRESSION: 1. Enlarged heterogeneous left ovary with complex low-attenuation areas scattered throughout the left ovary measuring up to 3.0 cm. Findings are favored to  represent pyosalpinx/tubo-ovarian abscess. 2. Complex fluid collection containing multiple septations inferior to the left ovary measuring up to 8.0 cm, possible abscess or pyosalpinx. 3. Complex fluid collection versus loculated fluid collection in the cul-de-sac containing multiple septations. 4. Right ovary not identified. 5. No evidence of left ovarian torsion. Electronically Signed   By: Greig Pique M.D.   On: 07/20/2023 22:33   CT ABDOMEN PELVIS W CONTRAST Result Date: 07/20/2023 CLINICAL DATA:  Lower abdominal pain EXAM: CT ABDOMEN AND PELVIS WITH CONTRAST TECHNIQUE: Multidetector CT imaging of the abdomen and pelvis was performed using the standard protocol following bolus administration of intravenous contrast. RADIATION DOSE REDUCTION: This exam was performed according to the departmental dose-optimization program which includes automated exposure control, adjustment of the mA and/or kV according to patient size and/or use of iterative reconstruction technique. CONTRAST:  OMNIPAQUE  IOHEXOL  300 MG/ML  SOLN COMPARISON:  07/06/2022 FINDINGS: Lower chest: No acute abnormality Hepatobiliary: No focal hepatic abnormality. Gallbladder unremarkable. Pancreas: No focal abnormality or ductal dilatation. Spleen: No focal abnormality.  Normal size. Adrenals/Urinary Tract: No adrenal abnormality. No focal renal abnormality. No stones or hydronephrosis. Urinary bladder is unremarkable. Stomach/Bowel: Colonic diverticulosis. No active diverticulitis. Stomach and small bowel decompressed. No bowel obstruction. Vascular/Lymphatic: Aorta normal caliber. Mildly prominent retroperitoneal lymph nodes measuring up to 9 mm in short axis diameter, likely reactive. Reproductive: Uterus and right ovary unremarkable. Complex cystic mass noted within the left adnexa measuring 8.6 x 6.8 cm. This is concerning for tubo-ovarian abscess. Surrounding haziness/inflammation. Other: Moderate free fluid in the cul-de-sac. Fluid  appears complex with enhancing rim or peritoneum. Musculoskeletal: No acute bony abnormality. IMPRESSION: 8.6 x 6.8 cm complex cystic area in the left adnexa with surrounding inflammation. Findings concerning for tubo-ovarian abscess. Complex free fluid in the cul-de-sac where there appears to be enhancing peritoneum or rim. Electronically Signed   By: Franky Crease M.D.   On: 07/20/2023 21:15    Labs:  CBC: Recent Labs    07/20/23 1900 07/22/23 0523 07/23/23 0535 07/24/23 0422  WBC 15.7* 15.0* 14.2* 13.4*  HGB 10.0* 9.2* 9.0* 9.0*  HCT 32.1* 30.8* 29.4* 29.4*  PLT 612* 569* 611* 633*    COAGS: Recent Labs    07/23/23 0535  INR 1.2    BMP: Recent Labs    08/04/22 1515 11/05/22 1003 03/12/23 1624 07/20/23 1900  NA 138 139 136 140  K 4.2 4.6 4.0 4.0  CL 105 103 102 103  CO2 23 19* 26 24  GLUCOSE 99 110* 131* 113*  BUN 18 18 15 9   CALCIUM 8.8 9.5 8.8 9.1  CREATININE 1.04 1.05* 0.96 0.88  GFRNONAA  --   --   --  >60    LIVER FUNCTION TESTS: Recent Labs    08/04/22 1515 11/05/22 1003 07/20/23 1900  BILITOT 0.3 <0.2 0.3  AST 12 16 24   ALT 14 21 41  ALKPHOS 73 85 138*  PROT 7.2 7.2 8.1  ALBUMIN 3.8 3.7* 3.4*    Assessment and Plan:  43 y.o. female with pelvic abscess/ TOA s/p R TG drain placement by  Dr. Hughes on 6/27.   VSS Leukocytosis improving  Cx pending  Output 80 mL overnight, appears serous today  Drain Location: R TG Size: Fr size: 12 Fr Date of placement: 6/27  Currently to: Drain collection device: gravity 24 hour output:  Output by Drain (mL) 07/22/23 0701 - 07/22/23 1900 07/22/23 1901 - 07/23/23 0700 07/23/23 0701 - 07/23/23 1900 07/23/23 1901 - 07/24/23 0700 07/24/23 0701 - 07/24/23 0936  Closed System Drain Right Buttock Other (Comment) 12 Fr.    80     Interval imaging/drain manipulation:  None   Current examination: Does not flush/aspirate.  Insertion site unremarkable. Suture and stat lock in place. Dressed appropriately.    Plan: Discussed with Dr. Hughes regarding the drain not flushes/aspirates. The abscess was small at the time of the drain placement, it is possible that it has resolved already. Will monitor output for 1-2 days and if remains minimal, will repeat CT and consider drain removal.  Record output Q shift. Dressing changes QD or PRN if soiled.  Call IR APP or on call IR MD if difficulty flushing or sudden change in drain output.  Repeat imaging/possible drain injection once output < 10 mL/QD (excluding flush material). Consideration for drain removal if output is < 10 mL/QD (excluding flush material), pending discussion with the providing surgical service.  Discharge planning: Please contact IR APP or on call IR MD prior to patient d/c to ensure appropriate follow up plans are in place. Typically patient will follow up with IR clinic 10-14 days post d/c for repeat imaging/possible drain injection. IR scheduler will contact patient with date/time of appointment. Patient will need to flush drain QD with 5 cc NS, record output QD, dressing changes every 2-3 days or earlier if soiled.   IR will continue to follow - please call with questions or concerns.   Electronically Signed: Toya VEAR Cousin, PA-C 07/24/2023, 9:35 AM   I spent a total of 15 Minutes at the the patient's bedside AND on the patient's hospital floor or unit, greater than 50% of which was counseling/coordinating care for R TG drain f/u.   This chart was dictated using voice recognition software.  Despite best efforts to proofread,  errors can occur which can change the documentation meaning.

## 2023-07-24 NOTE — Progress Notes (Signed)
 Patient declined to have dressing to drain changed at this time. Dressing remains clean, dry, and intact.

## 2023-07-24 NOTE — Progress Notes (Signed)
 Patient ID: Cristina Adkins, female   DOB: [birth year], 43 y.o.   MRN: [id number]   Assessment/Plan: Essential hypertension Continue amlodipine   TOA (tubo-ovarian abscess) Versus diverticulitis is unclear which 1 of these is the primary cause however treatment would be the same. Continue antibiotics - WBC improving. Clinically improving.  S/p IR drainage. Drain is transgluteal and pt with right leg pain. Will add in Toradol . She cannot have NSAIDs PO due to peptic ulcer disease.   Constipation Severe.  Patient has not had a bowel movement since early June.  She is status post enema and mag citrate yesterday. Patient report significant pain with trying to have a bowel movement which is partially why she has not completed a bowel movement.  Does report improved movement with gas.    Subjective: Interval History: Patient reports pain has improved but now right leg pain after insertion of drain. Having trouble standing on that leg due to discomfort. She reports improved gas (flatus).   Objective: Vital signs in last 24 hours: Temp:  [98 F (36.7 C)-98.7 F (37.1 C)] 98 F (36.7 C) (06/27 2353) Pulse Rate:  [65-86] 83 (06/27 2353) Resp:  [15-26] 19 (06/27 2353) BP: (119-145)/(68-97) 125/70 (06/27 2353) SpO2:  [90 %-100 %] 96 % (06/27 2353)  Intake/Output from previous day: 06/27 0701 - 06/28 0700 In: 10  Out: 80 [Drains:80] Intake/Output this shift: No intake/output data recorded.  General appearance: alert, cooperative, appears stated age, and morbidly obese Lungs: Normal effort Heart: regular rate and rhythm Abdomen: Soft, positive guarding, tender to palpation in lower quadrants bilaterally JP drain inspected - white substance in drain, otherwise serous fluid within the drain.   Results for orders placed or performed during the hospital encounter of 07/20/23 (from the past 24 hours)  Aerobic/Anaerobic Culture w Gram Stain (surgical/deep wound)     Status: None (Preliminary  result)   Collection Time: 07/23/23 11:08 AM   Specimen: Abscess  Result Value Ref Range   Specimen Description ABSCESS    Special Requests NONE    Gram Stain      NO WBC SEEN NO ORGANISMS SEEN Performed at [facility], [address] [place], [place] [address]    Culture PENDING    Report Status PENDING   CBC     Status: Abnormal   Collection Time: 07/24/23  4:22 AM  Result Value Ref Range   WBC 13.4 (H) 4.0 - 10.5 K/uL   RBC 3.54 (L) 3.87 - 5.11 MIL/uL   Hemoglobin 9.0 (L) 12.0 - 15.0 g/dL   HCT 70.5 (L) 63.9 - 53.9 %   MCV 83.1 80.0 - 100.0 fL   MCH 25.4 (L) 26.0 - 34.0 pg   MCHC 30.6 30.0 - 36.0 g/dL   RDW 80.8 (H) 88.4 - 84.4 %   Platelets 633 (H) 150 - 400 K/uL   nRBC 0.0 0.0 - 0.2 %    Studies/Results: CT GUIDED PERITONEAL/RETROPERITONEAL FLUID DRAIN BY [facility] CATH Result Date: 07/23/2023 INDICATION: 10220 Abscess [id number] Briefly, 43 year old female with tubo-ovarian abscess and deep pelvic fluid collection. EXAM: CT-GUIDED PELVIC ABSCESS DRAINAGE CATHETER PLACEMENT COMPARISON:  CT AP, 07/20/2023.  PELVIC ULTRASOUND, 07/20/2023. MEDICATIONS: The patient is currently admitted to the hospital and receiving intravenous antibiotics. The antibiotics were administered within an appropriate time frame prior to the initiation of the procedure. ANESTHESIA/SEDATION: Moderate (conscious) sedation was employed during this procedure. A total of Versed 2.5 mg and Fentanyl 5 1 mcg was administered intravenously. Moderate Sedation Time: 27 minutes. The  patient's level of consciousness and vital signs were monitored continuously by radiology nursing throughout the procedure under my direct supervision. CONTRAST:  None FLUOROSCOPY TIME:  CT dose; 1736 mGycm COMPLICATIONS: None immediate. PROCEDURE: RADIATION DOSE REDUCTION: This exam was performed according to the departmental dose-optimization program which includes automated exposure control, adjustment of the mA and/or kV according to  patient size and/or use of iterative reconstruction technique. Informed written consent was obtained from the patient after a discussion of the risks, benefits and alternatives to treatment. The patient was placed prone on the CT gantry and a pre procedural CT was performed re-demonstrating the known abscess/fluid collection within the deep pelvis. The procedure was planned. A timeout was performed prior to the initiation of the procedure. The RIGHT gluteus was prepped and draped in the usual sterile fashion. The overlying soft tissues were anesthetized with 1% lidocaine with epinephrine . Appropriate trajectory was planned with the use of a 22 gauge spinal needle. An 18 gauge trocar needle was advanced into the abscess/fluid collection and a short Amplatz super stiff wire was coiled within the collection. Appropriate positioning was confirmed with a limited CT scan. The tract was serially dilated allowing placement of a 12 Fr Fr drainage catheter. Appropriate positioning was confirmed with a limited postprocedural CT scan. 10 mL of serous fluid was aspirated. The tube was connected to a drainage bag and sutured in place. A dressing was placed. The patient tolerated the procedure well without immediate post procedural complication. IMPRESSION: Successful CT-guided placement of a 12 Fr drainage catheter into the pelvis, via a RIGHT transgluteal approach, with aspiration of 10 mL of purulent fluid. Samples were sent to the laboratory as requested by the ordering clinical team. RECOMMENDATIONS: The patient will return to Vascular Interventional Radiology (VIR) for routine drainage catheter evaluation and exchange in 5-7 days. Thom Hall, MD [facility] Electronically Signed   By: Thom Hall M.D.   On: 07/23/2023 17:06   US  PELVIC COMPLETE W TRANSVAGINAL AND TORSION R/O Result Date: 07/20/2023 CLINICAL DATA:  Left lower quadrant pain. Question left tubal  ovarian abscess. EXAM: TRANSABDOMINAL AND TRANSVAGINAL ULTRASOUND OF PELVIS DOPPLER ULTRASOUND OF OVARIES TECHNIQUE: Both transabdominal and transvaginal ultrasound examinations of the pelvis were performed. Transabdominal technique was performed for global imaging of the pelvis including uterus, ovaries, adnexal regions, and pelvic cul-de-sac. It was necessary to proceed with endovaginal exam following the transabdominal exam to visualize the ovaries and endometrium. Color and duplex Doppler ultrasound was utilized to evaluate blood flow to the ovaries. COMPARISON:  CT abdomen and pelvis 07/20/2023 FINDINGS: Uterus Measurements: 11.2 x 6.1 x 7.2 cm = volume: 256 mL. No fibroids or other mass visualized. Endometrium Thickness: 11 mm.  No focal abnormality visualized. Right ovary Not identified Left ovary Measurements: 8.2 x 5.5 x 6.6 cm = volume: 156 mL. Diffusely heterogeneous. Complex low-attenuation areas are seen scattered throughout the left ovary with the largest measuring 2.6 x 3.0 x 2.9 cm. Inferior to the left ovary there is a complex fluid collection containing multiple septations measuring 8.0 x 4.4 x 6.0 cm. Pulsed Doppler evaluation of the left ovary demonstrates normal low-resistance arterial and venous waveforms. Other findings There is a complex fluid collection versus loculated fluid collection in the cul-de-sac containing multiple septations. IMPRESSION: 1. Enlarged heterogeneous left ovary with complex low-attenuation areas scattered throughout the left ovary measuring up to 3.0 cm. Findings are favored to represent pyosalpinx/tubo-ovarian abscess. 2. Complex fluid collection containing multiple septations inferior to the  left ovary measuring up to 8.0 cm, possible abscess or pyosalpinx. 3. Complex fluid collection versus loculated fluid collection in the cul-de-sac containing multiple septations. 4. Right ovary not identified. 5. No evidence of left ovarian torsion. Electronically Signed   By:  Greig Pique M.D.   On: 07/20/2023 22:33   CT ABDOMEN PELVIS W CONTRAST Result Date: 07/20/2023 CLINICAL DATA:  Lower abdominal pain EXAM: CT ABDOMEN AND PELVIS WITH CONTRAST TECHNIQUE: Multidetector CT imaging of the abdomen and pelvis was performed using the standard protocol following bolus administration of intravenous contrast. RADIATION DOSE REDUCTION: This exam was performed according to the departmental dose-optimization program which includes automated exposure control, adjustment of the mA and/or kV according to patient size and/or use of iterative reconstruction technique. CONTRAST:  OMNIPAQUE  IOHEXOL  300 MG/ML  SOLN COMPARISON:  07/06/2022 FINDINGS: Lower chest: No acute abnormality Hepatobiliary: No focal hepatic abnormality. Gallbladder unremarkable. Pancreas: No focal abnormality or ductal dilatation. Spleen: No focal abnormality.  Normal size. Adrenals/Urinary Tract: No adrenal abnormality. No focal renal abnormality. No stones or hydronephrosis. Urinary bladder is unremarkable. Stomach/Bowel: Colonic diverticulosis. No active diverticulitis. Stomach and small bowel decompressed. No bowel obstruction. Vascular/Lymphatic: Aorta normal caliber. Mildly prominent retroperitoneal lymph nodes measuring up to 9 mm in short axis diameter, likely reactive. Reproductive: Uterus and right ovary unremarkable. Complex cystic mass noted within the left adnexa measuring 8.6 x 6.8 cm. This is concerning for tubo-ovarian abscess. Surrounding haziness/inflammation. Other: Moderate free fluid in the cul-de-sac. Fluid appears complex with enhancing rim or peritoneum. Musculoskeletal: No acute bony abnormality. IMPRESSION: 8.6 x 6.8 cm complex cystic area in the left adnexa with surrounding inflammation. Findings concerning for tubo-ovarian abscess. Complex free fluid in the cul-de-sac where there appears to be enhancing peritoneum or rim. Electronically Signed   By: Franky Crease M.D.   On: 07/20/2023 21:15     Scheduled Meds:  amLODipine   10 mg Oral Daily   buPROPion   150 mg Oral Daily   carvedilol   3.125 mg Oral BID WC   cholecalciferol  3,000 Units Oral Q0600   cyanocobalamin   1,000 mcg Oral Daily   doxycycline   100 mg Oral Q12H   ferrous sulfate   325 mg Oral QODAY   irbesartan   300 mg Oral Daily   And   hydrochlorothiazide   25 mg Oral Daily   ketorolac   30 mg Intravenous Q6H   metroNIDAZOLE   500 mg Oral Q12H   pantoprazole   20 mg Oral Daily   polyethylene glycol  17 g Oral BID   prenatal multivitamin  1 tablet Oral Q1200   senna-docusate  1 tablet Oral BID   sodium chloride  flush  3 mL Intravenous Q12H   sodium chloride  flush  5 mL Intracatheter Q8H   Continuous Infusions:  cefTRIAXone  (ROCEPHIN )  IV 2 g (07/23/23 2355)   PRN Meds:albuterol , cyclobenzaprine, HYDROmorphone  (DILAUDID ) injection, ondansetron  (ZOFRAN ) IV, oxyCODONE -acetaminophen , sodium chloride  flush, zolpidem     LOS: 3 days   Vina Solian, MD 07/24/2023 7:47 AM

## 2023-07-25 DIAGNOSIS — M79604 Pain in right leg: Secondary | ICD-10-CM | POA: Diagnosis not present

## 2023-07-25 NOTE — Progress Notes (Signed)
 Patient expressed to this nurse that she is experiencing a lot of pain that starts at the bottom of her right buttock that spreads down her thigh (this nurse palpated the area in which the pt was describing where pain was localized at, and it was in her muscular area). Patient states that this pain only started after the drain placement and was not present before procedure. Patient states that it is debilitating and she expresses great concerns with being discharged home in this condition. This nurse reassured the patient that she would contact Interventional Radiology to express patient's concerns. This nurse was able to get in touch with Mugweru MD the weekend on call IR provider. He stated that someone would come and see the patient in the morning to address her concerns.This nurse will let day shift nurse know about this plan.

## 2023-07-25 NOTE — Progress Notes (Signed)
 Patient ID: Cristina Adkins, female   DOB: 03-13-1980, 43 y.o.   MRN: [id number]   Assessment/Plan: Essential hypertension Continue amlodipine   TOA (tubo-ovarian abscess) Versus diverticulitis is unclear which 1 of these is the primary cause however treatment would be the same. Continue antibiotics Status post IR drainage  Pain is significantly improved Culture appears to have no growth WBC trending down  Constipation Severe.  Patient has not had a bowel movement since early June. She is status post enema and mag citrate. Patient was able to have a complete BM this morning.  Right leg pain Question of this is related to placement of her drain. Appears to be improving Flexeril as needed    Subjective: Interval History: Patient reports her pain is much improved today.  She has successfully completed the bowel movements as well.  She does have significant right lower extremity pain that is in her buttock and the upper posterior thigh.  She has felt better with Flexeril and is able to ambulate without difficulty today.  Objective: Vital signs in last 24 hours: Temp:  [98.4 F (36.9 C)-98.9 F (37.2 C)] 98.9 F (37.2 C) (06/29 0810) Pulse Rate:  [74-91] 91 (06/29 0810) Resp:  [17-20] 19 (06/29 0810) BP: (130-150)/(71-84) 143/84 (06/29 0810) SpO2:  [94 %-98 %] 98 % (06/29 0810)  Intake/Output from previous day: 06/28 0701 - 06/29 0700 In: 5  Out: 10 [Drains:10] Intake/Output this shift: No intake/output data recorded.  General appearance: alert, cooperative, appears stated age, and moderately obese Head: Normocephalic, without obvious abnormality, atraumatic Lungs: Normal effort Heart: regular rate and rhythm Abdomen: Soft, much less tender Extremities: Patient has tenderness in the right upper thigh and buttock Neurologic: Grossly normal strength seems to be 5 out of 5 in the right lower extremity  No results found for this or any previous visit (from the past 24  hours).  Studies/Results: CT GUIDED PERITONEAL/RETROPERITONEAL FLUID DRAIN BY [facility] CATH Result Date: 07/23/2023 INDICATION: 10220 Abscess [id number] Briefly, 43 year old female with tubo-ovarian abscess and deep pelvic fluid collection. EXAM: CT-GUIDED PELVIC ABSCESS DRAINAGE CATHETER PLACEMENT COMPARISON:  CT AP, 07/20/2023.  PELVIC ULTRASOUND, 07/20/2023. MEDICATIONS: The patient is currently admitted to the hospital and receiving intravenous antibiotics. The antibiotics were administered within an appropriate time frame prior to the initiation of the procedure. ANESTHESIA/SEDATION: Moderate (conscious) sedation was employed during this procedure. A total of Versed 2.5 mg and Fentanyl 5 1 mcg was administered intravenously. Moderate Sedation Time: 27 minutes. The patient's level of consciousness and vital signs were monitored continuously by radiology nursing throughout the procedure under my direct supervision. CONTRAST:  None FLUOROSCOPY TIME:  CT dose; 1736 mGycm COMPLICATIONS: None immediate. PROCEDURE: RADIATION DOSE REDUCTION: This exam was performed according to the departmental dose-optimization program which includes automated exposure control, adjustment of the mA and/or kV according to patient size and/or use of iterative reconstruction technique. Informed written consent was obtained from the patient after a discussion of the risks, benefits and alternatives to treatment. The patient was placed prone on the CT gantry and a pre procedural CT was performed re-demonstrating the known abscess/fluid collection within the deep pelvis. The procedure was planned. A timeout was performed prior to the initiation of the procedure. The RIGHT gluteus was prepped and draped in the usual sterile fashion. The overlying soft tissues were anesthetized with 1% lidocaine with epinephrine . Appropriate trajectory was planned with the use of a 22 gauge spinal needle. An 18 gauge trocar needle was advanced into the  abscess/fluid collection and a  short Amplatz super stiff wire was coiled within the collection. Appropriate positioning was confirmed with a limited CT scan. The tract was serially dilated allowing placement of a 12 Fr Fr drainage catheter. Appropriate positioning was confirmed with a limited postprocedural CT scan. 10 mL of serous fluid was aspirated. The tube was connected to a drainage bag and sutured in place. A dressing was placed. The patient tolerated the procedure well without immediate post procedural complication. IMPRESSION: Successful CT-guided placement of a 12 Fr drainage catheter into the pelvis, via a RIGHT transgluteal approach, with aspiration of 10 mL of purulent fluid. Samples were sent to the laboratory as requested by the ordering clinical team. RECOMMENDATIONS: The patient will return to Vascular Interventional Radiology (VIR) for routine drainage catheter evaluation and exchange in 5-7 days. Thom Hall, MD [facility]iology Electronically Signed   By: Thom Hall M.D.   On: 07/23/2023 17:06   US  PELVIC COMPLETE W TRANSVAGINAL AND TORSION R/O Result Date: 07/20/2023 CLINICAL DATA:  Left lower quadrant pain. Question left tubal ovarian abscess. EXAM: TRANSABDOMINAL AND TRANSVAGINAL ULTRASOUND OF PELVIS DOPPLER ULTRASOUND OF OVARIES TECHNIQUE: Both transabdominal and transvaginal ultrasound examinations of the pelvis were performed. Transabdominal technique was performed for global imaging of the pelvis including uterus, ovaries, adnexal regions, and pelvic cul-de-sac. It was necessary to proceed with endovaginal exam following the transabdominal exam to visualize the ovaries and endometrium. Color and duplex Doppler ultrasound was utilized to evaluate blood flow to the ovaries. COMPARISON:  CT abdomen and pelvis 07/20/2023 FINDINGS: Uterus Measurements: 11.2 x 6.1 x 7.2 cm = volume: 256 mL. No fibroids or other mass visualized.  Endometrium Thickness: 11 mm.  No focal abnormality visualized. Right ovary Not identified Left ovary Measurements: 8.2 x 5.5 x 6.6 cm = volume: 156 mL. Diffusely heterogeneous. Complex low-attenuation areas are seen scattered throughout the left ovary with the largest measuring 2.6 x 3.0 x 2.9 cm. Inferior to the left ovary there is a complex fluid collection containing multiple septations measuring 8.0 x 4.4 x 6.0 cm. Pulsed Doppler evaluation of the left ovary demonstrates normal low-resistance arterial and venous waveforms. Other findings There is a complex fluid collection versus loculated fluid collection in the cul-de-sac containing multiple septations. IMPRESSION: 1. Enlarged heterogeneous left ovary with complex low-attenuation areas scattered throughout the left ovary measuring up to 3.0 cm. Findings are favored to represent pyosalpinx/tubo-ovarian abscess. 2. Complex fluid collection containing multiple septations inferior to the left ovary measuring up to 8.0 cm, possible abscess or pyosalpinx. 3. Complex fluid collection versus loculated fluid collection in the cul-de-sac containing multiple septations. 4. Right ovary not identified. 5. No evidence of left ovarian torsion. Electronically Signed   By: Greig Pique M.D.   On: 07/20/2023 22:33   CT ABDOMEN PELVIS W CONTRAST Result Date: 07/20/2023 CLINICAL DATA:  Lower abdominal pain EXAM: CT ABDOMEN AND PELVIS WITH CONTRAST TECHNIQUE: Multidetector CT imaging of the abdomen and pelvis was performed using the standard protocol following bolus administration of intravenous contrast. RADIATION DOSE REDUCTION: This exam was performed according to the departmental dose-optimization program which includes automated exposure control, adjustment of the mA and/or kV according to patient size and/or use of iterative reconstruction technique. CONTRAST:  OMNIPAQUE  IOHEXOL  300 MG/ML  SOLN COMPARISON:  07/06/2022 FINDINGS: Lower chest: No acute abnormality  Hepatobiliary: No focal hepatic abnormality. Gallbladder unremarkable. Pancreas: No focal abnormality or ductal dilatation. Spleen: No focal abnormality.  Normal size. Adrenals/Urinary Tract: No adrenal abnormality. No focal renal  abnormality. No stones or hydronephrosis. Urinary bladder is unremarkable. Stomach/Bowel: Colonic diverticulosis. No active diverticulitis. Stomach and small bowel decompressed. No bowel obstruction. Vascular/Lymphatic: Aorta normal caliber. Mildly prominent retroperitoneal lymph nodes measuring up to 9 mm in short axis diameter, likely reactive. Reproductive: Uterus and right ovary unremarkable. Complex cystic mass noted within the left adnexa measuring 8.6 x 6.8 cm. This is concerning for tubo-ovarian abscess. Surrounding haziness/inflammation. Other: Moderate free fluid in the cul-de-sac. Fluid appears complex with enhancing rim or peritoneum. Musculoskeletal: No acute bony abnormality. IMPRESSION: 8.6 x 6.8 cm complex cystic area in the left adnexa with surrounding inflammation. Findings concerning for tubo-ovarian abscess. Complex free fluid in the cul-de-sac where there appears to be enhancing peritoneum or rim. Electronically Signed   By: Franky Crease M.D.   On: 07/20/2023 21:15    Scheduled Meds:  amLODipine   10 mg Oral Daily   buPROPion   150 mg Oral Daily   carvedilol   3.125 mg Oral BID WC   cholecalciferol  3,000 Units Oral Q0600   cyanocobalamin   1,000 mcg Oral Daily   doxycycline   100 mg Oral Q12H   ferrous sulfate   325 mg Oral QODAY   irbesartan   300 mg Oral Daily   And   hydrochlorothiazide   25 mg Oral Daily   ketorolac   30 mg Intravenous Q6H   metroNIDAZOLE   500 mg Oral Q12H   pantoprazole   20 mg Oral Daily   polyethylene glycol  17 g Oral BID   prenatal multivitamin  1 tablet Oral Q1200   senna-docusate  1 tablet Oral BID   sodium chloride  flush  3 mL Intravenous Q12H   sodium chloride  flush  5 mL Intracatheter Q8H   Continuous Infusions:   cefTRIAXone  (ROCEPHIN )  IV 2 g (07/25/23 0015)   PRN Meds:albuterol , cyclobenzaprine, HYDROmorphone  (DILAUDID ) injection, ondansetron  (ZOFRAN ) IV, oxyCODONE -acetaminophen , sodium chloride  flush, zolpidem     LOS: 4 days   Glenys GORMAN Birk, MD 07/25/2023 10:40 AM

## 2023-07-25 NOTE — Progress Notes (Signed)
 Ms. Taormina declined to have dressing to drain in right buttock changed.

## 2023-07-25 NOTE — Plan of Care (Signed)
  Problem: Health Behavior/Discharge Planning: Goal: Ability to manage health-related needs will improve Outcome: Progressing   Problem: Clinical Measurements: Goal: Ability to maintain clinical measurements within normal limits will improve Outcome: Progressing Goal: Will remain free from infection Outcome: Progressing Goal: Diagnostic test results will improve Outcome: Progressing Goal: Respiratory complications will improve Outcome: Progressing Goal: Cardiovascular complication will be avoided Outcome: Progressing   Problem: Activity: Goal: Risk for activity intolerance will decrease Outcome: Progressing   Problem: Nutrition: Goal: Adequate nutrition will be maintained Outcome: Progressing   Problem: Elimination: Goal: Will not experience complications related to bowel motility Outcome: Progressing   Problem: Pain Managment: Goal: General experience of comfort will improve and/or be controlled Outcome: Progressing   Problem: Skin Integrity: Goal: Risk for impaired skin integrity will decrease Outcome: Progressing

## 2023-07-25 NOTE — Assessment & Plan Note (Signed)
 Question of this is related to placement of her drain. Appears to be improving Flexeril as needed

## 2023-07-26 ENCOUNTER — Inpatient Hospital Stay (HOSPITAL_COMMUNITY)

## 2023-07-26 ENCOUNTER — Other Ambulatory Visit (HOSPITAL_COMMUNITY): Payer: Self-pay

## 2023-07-26 LAB — CBC
HCT: 32.6 % — ABNORMAL LOW (ref 36.0–46.0)
Hemoglobin: 10.1 g/dL — ABNORMAL LOW (ref 12.0–15.0)
MCH: 25.8 pg — ABNORMAL LOW (ref 26.0–34.0)
MCHC: 31 g/dL (ref 30.0–36.0)
MCV: 83.2 fL (ref 80.0–100.0)
Platelets: 776 10*3/uL — ABNORMAL HIGH (ref 150–400)
RBC: 3.92 MIL/uL (ref 3.87–5.11)
RDW: 18.9 % — ABNORMAL HIGH (ref 11.5–15.5)
WBC: 12.5 10*3/uL — ABNORMAL HIGH (ref 4.0–10.5)
nRBC: 0 % (ref 0.0–0.2)

## 2023-07-26 MED ORDER — DOXYCYCLINE HYCLATE 100 MG PO TABS
100.0000 mg | ORAL_TABLET | Freq: Two times a day (BID) | ORAL | 0 refills | Status: AC
  Filled 2023-07-26: qty 28, 14d supply, fill #0

## 2023-07-26 MED ORDER — OXYCODONE-ACETAMINOPHEN 5-325 MG PO TABS
1.0000 | ORAL_TABLET | Freq: Four times a day (QID) | ORAL | 0 refills | Status: AC | PRN
  Filled 2023-07-26: qty 30, 7d supply, fill #0

## 2023-07-26 MED ORDER — IOHEXOL 350 MG/ML SOLN
100.0000 mL | Freq: Once | INTRAVENOUS | Status: AC | PRN
  Administered 2023-07-26: 100 mL via INTRAVENOUS

## 2023-07-26 MED ORDER — KETOROLAC TROMETHAMINE 30 MG/ML IJ SOLN
30.0000 mg | Freq: Four times a day (QID) | INTRAMUSCULAR | Status: DC
  Administered 2023-07-26 (×2): 30 mg via INTRAVENOUS
  Filled 2023-07-26 (×2): qty 1

## 2023-07-26 MED ORDER — METRONIDAZOLE 500 MG PO TABS
500.0000 mg | ORAL_TABLET | Freq: Two times a day (BID) | ORAL | 0 refills | Status: AC
  Filled 2023-07-26: qty 28, 14d supply, fill #0

## 2023-07-26 NOTE — Progress Notes (Signed)
 Referring Provider(s): Dr. Winton Felt (OB/GYN)  Supervising Physician: Luverne Aran  Patient Status:  [facility] - In-pt  Chief Complaint: Pelvic abscess s/p right transgluteal drain placement on 6/27 with Dr. JINNY Hall  Brief History:  43 year old female who presented to the ED on 6/24 with complaints of several weeks of worsening abdominal pain, dysuria, and lower extremity edema. ED workup significant for leukocytosis and CT showing complex cystic area in the left adnexa concerning for tubo-ovarian abscess. Pelvic US  confirmed a 3cm tubo-ovarian abscess with surrounding 8 cm abscess. She was initially admitted to GYN and treated conservatively with IV antibiotics and supportive care. However after minimal improvement, IR was consulted for aspiration/drain placement. Patient underwent transgluteal drain placement with Dr. JINNY Hall on 6/27.   Subjective:  Patient reports improved abdominal pain without any fevers/chills. Admits to some continued pain down her right leg since drain placement, but is optimistic it will improve on drain removal.   Allergies: Latex, Other, and Tomato  Medications: Prior to Admission medications   Medication Sig Start Date End Date Taking? Authorizing Provider  albuterol  (VENTOLIN  HFA) 108 (90 Base) MCG/ACT inhaler Inhale 2 puffs into the lungs every 6 (six) hours as needed for wheezing or shortness of breath. 05/18/22   Lorin Norris, MD  amLODipine  (NORVASC ) 10 MG tablet Take 1 tablet (10 mg total) by mouth daily. 07/25/23   O'Sullivan, Melissa, NP  buPROPion  (WELLBUTRIN  SR) 150 MG 12 hr tablet Take 1 tablet (150 mg total) by mouth daily. 01/13/23   O'Sullivan, Melissa, NP  carvedilol  (COREG ) 3.125 MG tablet Take 1 tablet (3.125 mg total) by mouth 2 (two) times daily with a meal. 05/25/23   Daryl Setter, NP  Cholecalciferol (VITAMIN D3) 75 MCG (3000 UT) TABS Take 1 tablet by mouth daily at 6 (six) AM. 01/13/23   Daryl Setter, NP   cyanocobalamin  (VITAMIN B12) 1000 MCG tablet Take 1 tablet (1,000 mcg total) by mouth daily. 08/04/22   Daryl Setter, NP  EPINEPHrine  0.3 mg/0.3 mL IJ SOAJ injection Inject 0.3 mg into the muscle as needed for anaphylaxis. 01/13/23   Daryl Setter, NP  Fiber Adult Gummies 2 g CHEW Take a directed on bottle 07/14/22   Juvenal Raisin U, DO  Iron , Ferrous Sulfate , 325 (65 Fe) MG TABS Take 325 mg by mouth every other day. 08/04/22   O'Sullivan, Melissa, NP  Multiple Vitamins-Minerals (MULTIVITAMIN WITH MINERALS) tablet Take 1 tablet by mouth daily. 08/13/21   O'Sullivan, Melissa, NP  ondansetron  (ZOFRAN -ODT) 4 MG disintegrating tablet Take 1 tablet (4 mg total) by mouth every 8 (eight) hours as needed for nausea or vomiting. 07/06/22   Dreama Longs, MD  polyethylene glycol (MIRALAX  / GLYCOLAX ) 17 g packet Take 17 g by mouth 2 (two) times daily. 07/14/22   Vann, Jessica U, DO  valsartan -hydrochlorothiazide  (DIOVAN -HCT) 320-25 MG tablet TAKE ONE TABLET BY MOUTH ONE TIME DAILY 07/25/23   O'Sullivan, Melissa, NP  Vitamin D , Ergocalciferol , (DRISDOL ) 1.25 MG (50000 UNIT) CAPS capsule Take 1 capsule (50,000 Units total) by mouth every 7 (seven) days. 08/05/22   Daryl Setter, NP    Vital Signs: BP (!) 150/98 (BP Location: Right Wrist)   Pulse 82   Temp 98.4 F (36.9 C) (Oral)   Resp 17   Ht 5' 1 (1.549 m)   Wt (!) 318 lb 6.4 oz (144.4 kg)   LMP 07/14/2023   SpO2 95%   BMI 60.16 kg/m   Physical Exam Constitutional:      Appearance: She  is obese.   Cardiovascular:     Rate and Rhythm: Normal rate.  Pulmonary:     Effort: Pulmonary effort is normal.  Abdominal:     General: Abdomen is flat.     Palpations: Abdomen is soft.     Tenderness: There is no abdominal tenderness.     Comments: Right transgluteal drain sutured in place w/ trace output in drainage bag. Skin is non-erythematous and non-tender to palpation   Skin:    General: Skin is warm and dry.   Neurological:      Mental Status: She is alert and oriented to person, place, and time.    Drain Location: Right Transgluteal Size: Fr size: 12 Fr Date of placement: 07/23/23  Currently to: Drain collection device: gravity 24 hour output:  Output by Drain (mL) 07/24/23 0701 - 07/24/23 1900 07/24/23 1901 - 07/25/23 0700 07/25/23 0701 - 07/25/23 1900 07/25/23 1901 - 07/26/23 0700 07/26/23 0701 - 07/26/23 1509  Closed System Drain Right Buttock Other (Comment) 12 Fr. 5 5 5 5      Interval imaging/drain manipulation:  CT A/P W CONTRAST - 6/30:  IMPRESSION: 1. Percutaneous drain has been placed from a right transgluteal approach. The fluid collection posterior to the uterus has significantly decreased in size. 2. Complex fluid collections along the left side of the drain in the pelvis. These collections extend into the left adnexal region. 3. Complex multiloculated collection superior to the uterine fundus and left adnexa. This collection has increased in size and now measures 10.7 x 6.6 x 8.0 cm. Findings compatible with a complex tubo-ovarian abscess.  Current examination:  Insertion site unremarkable. Suture and stat lock in place. Dressed appropriately.   Labs:  CBC: Recent Labs    07/22/23 0523 07/23/23 0535 07/24/23 0422 07/26/23 1207  WBC 15.0* 14.2* 13.4* 12.5*  HGB 9.2* 9.0* 9.0* 10.1*  HCT 30.8* 29.4* 29.4* 32.6*  PLT 569* 611* 633* 776*    COAGS: Recent Labs    07/23/23 0535  INR 1.2    BMP: Recent Labs    08/04/22 1515 11/05/22 1003 03/12/23 1624 07/20/23 1900  NA 138 139 136 140  K 4.2 4.6 4.0 4.0  CL 105 103 102 103  CO2 23 19* 26 24  GLUCOSE 99 110* 131* 113*  BUN 18 18 15 9   CALCIUM 8.8 9.5 8.8 9.1  CREATININE 1.04 1.05* 0.96 0.88  GFRNONAA  --   --   --  >60    LIVER FUNCTION TESTS: Recent Labs    08/04/22 1515 11/05/22 1003 07/20/23 1900  BILITOT 0.3 <0.2 0.3  AST 12 16 24   ALT 14 21 41  ALKPHOS 73 85 138*  PROT 7.2 7.2 8.1  ALBUMIN 3.8 3.7*  3.4*    Assessment and Plan:  Pelvic Abscess s/p right transgluteal drain placement: Cristina Adkins is a 43 y.o. female with a history of pelvic abscess who failed conservative management with IV antibiotics and subsequently underwent percutaneous drain placement in IR with Dr. JINNY Hall om 6/27.  -Patient reports improved abdominal pain  -Denies any new pain/discomfort -Afebrile with improving WBC -Minimal output in drain for the past several days -Repeat CT completed which showed resolution of R TG drain fluid collection -Drain subsequently pulled at the bedside; patient tolerated well and without any pain or difficulty; clean/dry bandage placed over drain site -Discussed after care with patient who verbalized understanding -Per RN, plans for discharge in the next 24hrs  Please reach out to IR for any  additional questions or concerns.   Thank you for allowing our service to participate in Cristina Adkins 's care.   Electronically Signed: Azarius Lambson M Shacarra Choe, PA-C 07/26/2023, 2:44 PM    I spent a total of 15 Minutes at the the patient's bedside AND on the patient's hospital floor or unit, greater than 50% of which was counseling/coordinating care for drain care follow up and removal.

## 2023-07-26 NOTE — Progress Notes (Signed)
 Gynecology Progress Note  Admission Date: 07/20/2023 Current Date: 07/26/2023 12:29 PM  Cristina Adkins is a 43 y.o. G1P1001 HD#5 admitted for TOA   History complicated by: Patient Active Problem List   Diagnosis Date Noted   Right leg pain 07/25/2023   Constipation 07/23/2023   TOA (tubo-ovarian abscess) 07/20/2023   Prediabetes 11/09/2022   Insulin  resistance 11/09/2022   SOB (shortness of breath) on exertion 11/05/2022   Other fatigue 11/05/2022   BMI 60.0-69.9, adult (HCC) 11/05/2022   Seasonal and perennial allergic rhinitis 09/17/2022   Mild intermittent asthma without complication 09/17/2022   Allergic conjunctivitis of both eyes 09/17/2022   Heme positive stool 08/20/2022   Vitamin D  deficiency 08/05/2022   B12 deficiency 08/04/2022   OSA (obstructive sleep apnea) 08/04/2022   UTI (urinary tract infection) 07/10/2022   Back pain 07/10/2022   Generalized abdominal pain 07/10/2022   Trichomonas vaginalis (TV) infection 01/12/2022   Latex allergy  01/12/2022   Pollen-food allergy  01/12/2022   Morbid obesity (HCC) 04/02/2021   Preventative health care 03/05/2021   Hyperglycemia 12/17/2020   Proteinuria 12/17/2020   Anxiety and depression 09/18/2020   Essential hypertension 09/18/2020   Insomnia 09/18/2020   Iron  deficiency    Cardiomegaly 03/29/2017   History of tobacco abuse 03/29/2017   Anemia 03/29/2017   Heavy menstrual bleeding 03/29/2017    ROS and patient/family/surgical history, located on admission H&P note dated 07/20/2023, have been reviewed, and there are no changes except as noted below Yesterday/Overnight Events:  None significant  Subjective:  Pt seen.  Per pt she clinically feels better.  Abdominal pain has mostly resolved.  She denies any nausea/vomiting.  There is no fever or chills.  Pt had one bowel movement a day or so ago, but none since.  Pt still noted intermittent right posterior gluteal pain since the drain was placed.  Objective:    Vitals:   07/25/23 1944 07/26/23 0001 07/26/23 0544 07/26/23 0848  BP: 138/73 (!) 146/89 127/78 (!) 148/91  Pulse: 84 82 77 84  Resp: 19 19 17 18   Temp: 98.4 F (36.9 C) 99 F (37.2 C) 97.8 F (36.6 C) 98.4 F (36.9 C)  TempSrc: Oral Oral Oral Oral  SpO2: 99% 97% 97% 98%  Weight:      Height:        Temp:  [97.5 F (36.4 C)-99 F (37.2 C)] 98.4 F (36.9 C) (06/30 0848) Pulse Rate:  [75-94] 84 (06/30 0848) Resp:  [17-19] 18 (06/30 0848) BP: (127-148)/(73-96) 148/91 (06/30 0848) SpO2:  [95 %-99 %] 98 % (06/30 0848) I/O last 3 completed shifts: In: 315 [Other:15; IV Piggyback:300] Out: 15 [Drains:15] No intake/output data recorded.  Intake/Output Summary (Last 24 hours) at 07/26/2023 1229 Last data filed at 07/26/2023 0242 Gross per 24 hour  Intake 305 ml  Output 5 ml  Net 300 ml     Current Vital Signs 24h Vital Sign Ranges  T 98.4 F (36.9 C) Temp  Avg: 98.2 F (36.8 C)  Min: 97.5 F (36.4 C)  Max: 99 F (37.2 C)  BP (!) 148/91 BP  Min: 127/78  Max: 148/91  HR 84 Pulse  Avg: 82.7  Min: 75  Max: 94  RR 18 Resp  Avg: 18  Min: 17  Max: 19  SaO2 98 % Room Air SpO2  Avg: 97.3 %  Min: 95 %  Max: 99 %       24 Hour I/O Current Shift I/O  Time Ins Outs 06/29 0701 - 06/30  0700 In: 310  Out: 10 [Drains:10] No intake/output data recorded.   Patient Vitals for the past 12 hrs:  BP Temp Temp src Pulse Resp SpO2  07/26/23 0848 (!) 148/91 98.4 F (36.9 C) Oral 84 18 98 %  07/26/23 0544 127/78 97.8 F (36.6 C) Oral 77 17 97 %     Patient Vitals for the past 24 hrs:  BP Temp Temp src Pulse Resp SpO2  07/26/23 0848 (!) 148/91 98.4 F (36.9 C) Oral 84 18 98 %  07/26/23 0544 127/78 97.8 F (36.6 C) Oral 77 17 97 %  07/26/23 0001 (!) 146/89 99 F (37.2 C) Oral 82 19 97 %  07/25/23 1944 138/73 98.4 F (36.9 C) Oral 84 19 99 %  07/25/23 1656 (!) 140/96 98.3 F (36.8 C) Oral 94 18 98 %  07/25/23 1347 -- (!) 97.5 F (36.4 C) Oral 75 17 95 %    Physical  exam: General appearance: alert, cooperative, appears stated age, no distress, and morbidly obese Abdomen: soft, non-tender; bowel sounds normal; no masses,  no organomegaly GU: No gross VB Lungs: clear to auscultation bilaterally Heart: regular rate and rhythm Extremities: no lower extremity edema Skin: WNL Psych: appropriate Neurologic: Grossly normal Trans gluteal drain noted intact with mild right inferior gluteal tenderness to palpation.  Medications Current Facility-Administered Medications  Medication Dose Route Frequency Provider Last Rate Last Admin   albuterol  (PROVENTIL ) (2.5 MG/3ML) 0.083% nebulizer solution 2.5 mg  2.5 mg Inhalation Q6H PRN Arnold, James G, MD       amLODipine  (NORVASC ) tablet 10 mg  10 mg Oral Daily Arnold, James G, MD   10 mg at 07/26/23 1127   buPROPion  (WELLBUTRIN  SR) 12 hr tablet 150 mg  150 mg Oral Daily Arnold, James G, MD   150 mg at 07/26/23 1128   carvedilol  (COREG ) tablet 3.125 mg  3.125 mg Oral BID WC Arnold, James G, MD   3.125 mg at 07/26/23 1129   cefTRIAXone  (ROCEPHIN ) 2 g in sodium chloride  0.9 % 100 mL IVPB  2 g Intravenous Q24H Arnold, James G, MD   Stopped at 07/26/23 0051   cholecalciferol (VITAMIN D3) 25 MCG (1000 UNIT) tablet 3,000 Units  3,000 Units Oral [id number] Pratt, Tanya S, MD   3,000 Units at 07/26/23 0545   cyanocobalamin  (VITAMIN B12) tablet 1,000 mcg  1,000 mcg Oral Daily Pratt, Tanya S, MD   1,000 mcg at 07/26/23 1129   cyclobenzaprine (FLEXERIL) tablet 10 mg  10 mg Oral TID PRN Pratt, Tanya S, MD   10 mg at 07/24/23 9388   doxycycline  (VIBRA -TABS) tablet 100 mg  100 mg Oral Q12H Arnold, James G, MD   100 mg at 07/26/23 1127   ferrous sulfate  tablet 325 mg  325 mg Oral QODAY Pratt, Tanya S, MD   325 mg at 07/25/23 1342   irbesartan  (AVAPRO ) tablet 300 mg  300 mg Oral Daily Arnold, James G, MD   300 mg at 07/26/23 1129   And   hydrochlorothiazide  (HYDRODIURIL ) tablet 25 mg  25 mg Oral Daily Arnold, James G, MD   25 mg at 07/26/23  1128   HYDROmorphone  (DILAUDID ) injection 1 mg  1 mg Intravenous Q4H PRN Ozan, Jennifer, DO   1 mg at 07/24/23 2203   ketorolac  (TORADOL ) 30 MG/ML injection 30 mg  30 mg Intravenous Q6H Zina Jerilynn LABOR, MD   30 mg at 07/26/23 9071   metroNIDAZOLE  (FLAGYL ) tablet 500 mg  500 mg Oral Q12H Fredirick Merle  S, MD   500 mg at 07/26/23 1127   ondansetron  (ZOFRAN ) injection 4 mg  4 mg Intravenous Q6H PRN Arnold, James G, MD   4 mg at 07/22/23 1934   oxyCODONE -acetaminophen  (PERCOCET/ROXICET) 5-325 MG per tablet 1-2 tablet  1-2 tablet Oral Q3H PRN Arnold, James G, MD   2 tablet at 07/26/23 0545   pantoprazole  (PROTONIX ) EC tablet 20 mg  20 mg Oral Daily Constant, Peggy, MD   20 mg at 07/26/23 9071   polyethylene glycol (MIRALAX  / GLYCOLAX ) packet 17 g  17 g Oral BID Fredirick Glenys RAMAN, MD   17 g at 07/26/23 1124   prenatal multivitamin tablet 1 tablet  1 tablet Oral Q1200 Eveline Lynwood MATSU, MD   1 tablet at 07/25/23 1658   senna-docusate (Senokot-S) tablet 1 tablet  1 tablet Oral BID Ozan, Jennifer, DO   1 tablet at 07/26/23 1127   sodium chloride  flush (NS) 0.9 % injection 3 mL  3 mL Intravenous Q12H Eveline Lynwood MATSU, MD   3 mL at 07/26/23 0930   sodium chloride  flush (NS) 0.9 % injection 3 mL  3 mL Intravenous PRN Eveline Lynwood MATSU, MD       sodium chloride  flush (NS) 0.9 % injection 5 mL  5 mL Intracatheter Q8H Mugweru, Jon, MD   5 mL at 07/26/23 0239   zolpidem  (AMBIEN ) tablet 5 mg  5 mg Oral QHS PRN Eveline Lynwood MATSU, MD          Labs  Recent Labs  Lab 07/22/23 [phone] 07/23/23 0535 07/24/23 0422  WBC 15.0* 14.2* 13.4*  HGB 9.2* 9.0* 9.0*  HCT 30.8* 29.4* 29.4*  PLT 569* 611* 633*    Recent Labs  Lab 07/20/23 1900  NA 140  K 4.0  CL 103  CO2 24  BUN 9  CREATININE 0.88  CALCIUM 9.1  PROT 8.1  BILITOT 0.3  ALKPHOS 138*  ALT 41  AST 24  GLUCOSE 113*    Radiology CLINICAL DATA:  Tubo-ovarian abscess.  Status post drain placement.   EXAM: CT ABDOMEN AND PELVIS WITH CONTRAST    TECHNIQUE: Multidetector CT imaging of the abdomen and pelvis was performed using the standard protocol following bolus administration of intravenous contrast.   RADIATION DOSE REDUCTION: This exam was performed according to the departmental dose-optimization program which includes automated exposure control, adjustment of the mA and/or kV according to patient size and/or use of iterative reconstruction technique.   CONTRAST:  OMNIPAQUE  IOHEXOL  350 MG/ML SOLN   COMPARISON:  CT abdomen pelvis 07/20/2023   FINDINGS: Lower chest: Lung bases are clear.  No pleural effusions.   Hepatobiliary: Normal appearance of the liver, gallbladder and portal venous system. No biliary dilatation.   Pancreas: Unremarkable. No pancreatic ductal dilatation or surrounding inflammatory changes.   Spleen: Normal in size without focal abnormality.   Adrenals/Urinary Tract: Normal adrenal glands. Normal appearance of both kidneys without hydronephrosis. Small low-density structure in left kidney upper pole is suggestive for a cyst and does not require dedicated follow-up. Urinary bladder is decompressed.   Stomach/Bowel: Normal appearance of stomach. No bowel dilatation or obstruction. Diverticula involving the distal descending colon and proximal sigmoid colon. No focal bowel inflammation. Normal appendix.   Vascular/Lymphatic: Vascular structures are unremarkable. Again noted are enlarged retroperitoneal lymph nodes around the aorta. Index left periaortic lymph node measures 1.1 cm on image 45/3. Enlarged lymph node along the right side of the pelvis measures 0.9 cm on image 80/3 and not significantly  changed. This retroperitoneal lymphadenopathy has increased compared to 07/06/2022 suggesting that these lymph nodes are likely reactive.   Reproductive: Percutaneous drain has been placed from a right transgluteal approach and the fluid collection posterior to the uterus has significantly  decreased in size. No significant fluid around the drain but there appears to be complex fluid collections along the left side of the drain in the pelvis. These pelvic collections extend into the left adnexal region. This collection measures 4.3 x 3.3 cm image 79/3. Complex multiloculated collection superior to the left adnexa and uterine fundus. Complex collection superior to the uterus roughly measures 10.7 x 6.6 x 8.0 cm and previously measured 8.7 x 6.8 x 8.3 cm. Stranding or edema in the sigmoid mesocolon adjacent to the adnexal fluid collection.   Other: No evidence for free fluid in the abdomen or pelvis. Negative for free air.   Musculoskeletal: Mild subcutaneous edema. No acute bone abnormality.   IMPRESSION: 1. Percutaneous drain has been placed from a right transgluteal approach. The fluid collection posterior to the uterus has significantly decreased in size. 2. Complex fluid collections along the left side of the drain in the pelvis. These collections extend into the left adnexal region. 3. Complex multiloculated collection superior to the uterine fundus and left adnexa. This collection has increased in size and now measures 10.7 x 6.6 x 8.0 cm. Findings compatible with a complex tubo-ovarian abscess. 4. Colonic diverticulosis without evidence for acute diverticulitis. 5. Enlarged retroperitoneal lymph nodes are likely reactive.    Assessment & Plan:   *GYN: TOA * Resolution of initial abscess pocket with drain.  New collections noted, but pt clinically improved.  Cbc is pending for objective eval.  No growth from wound culture.  Spoke with IR attending who states due to continued discomfort in the posterior thigh and resolution of the original abscess they will remove the drain today. *Pain: improved to nonexistent other than thigh pain *FEN/GI: no further bowel movements, but passing gas easily Continue current abx regimen, but will have 14 days of oral abx upon  discharge with potential rescan in 3-4 months *Dispo: potential d/c this PM or on 7/1  Code Status: Full Code  Jerilynn Buddle,  MD Attending Center [facility])

## 2023-07-26 NOTE — Discharge Instructions (Addendum)
 Post Drain Removal Instructions:  Keep the site clean and dry. Cover the wound with a clean, dry dressing. Change the dressing every 1-2 days, or if it becomes wet or soiled.   Wash hands before and after touching the wound or changing the dressing. Avoid soaking the wound (e.g., in baths or pools) until fully healed.  Monitor for signs of local infection (increasing redness, swelling, warmth, pain, or purulent discharge) or systemic symptoms (fever, chills). Advise prompt medical evaluation in the ED if these occur.  Continue oral antibiotics as prescribed per your primary team.   Normal activities may be resumed as tolerated, avoiding trauma to the wound area.

## 2023-07-26 NOTE — Discharge Summary (Signed)
 Physician Discharge Summary  Patient ID: Cristina Adkins MRN: [id number] DOB/AGE: [birth year] 43 y.o.  Admit date: 07/20/2023 Discharge date: 07/26/2023  Admission Diagnoses:  Discharge Diagnoses:  Principal Problem:   TOA (tubo-ovarian abscess) Active Problems:   Essential hypertension   Constipation   Right leg pain   Discharged Condition: {condition:18240}  Hospital Course: ***  Consults: {consultation:18241}  Significant Diagnostic Studies: {diagnostics:18242}  Treatments: {Tx:18249}  Discharge Exam: Blood pressure (!) 150/98, pulse 82, temperature 98.4 F (36.9 C), temperature source Oral, resp. rate 17, height 5' 1 (1.549 m), weight (!) 144.4 kg, last menstrual period 07/14/2023, SpO2 95%, unknown if currently breastfeeding. {physical zkjf:6958869}  Disposition: Discharge disposition: 01-Home or Self Care       Discharge Instructions     Call MD for:  difficulty breathing, headache or visual disturbances   Complete by: As directed    Call MD for:  persistant dizziness or light-headedness   Complete by: As directed    Call MD for:  persistant nausea and vomiting   Complete by: As directed    Call MD for:  severe uncontrolled pain   Complete by: As directed    Call MD for:  temperature >100.4   Complete by: As directed    Diet - low sodium heart healthy   Complete by: As directed    Increase activity slowly   Complete by: As directed    Lifting restrictions   Complete by: As directed    Do not lift more than 20 pounds   No wound care   Complete by: As directed    Sexual Activity Restrictions   Complete by: As directed    Pelvic rest 6-8 weeks      Allergies as of 07/26/2023       Reactions   Latex Other (See Comments)   Irritated and burning skin   Other Swelling, Other (See Comments)   Allergic to all fruit and raw tomatoes reaction face swells   Tomato Swelling        Medication List     STOP taking these medications    ondansetron   4 MG disintegrating tablet Commonly known as: ZOFRAN -ODT       TAKE these medications    albuterol  108 (90 Base) MCG/ACT inhaler Commonly known as: VENTOLIN  HFA Inhale 2 puffs into the lungs every 6 (six) hours as needed for wheezing or shortness of breath.   amLODipine  10 MG tablet Commonly known as: NORVASC  Take 1 tablet (10 mg total) by mouth daily.   buPROPion  150 MG 12 hr tablet Commonly known as: Wellbutrin  SR Take 1 tablet (150 mg total) by mouth daily.   carvedilol  3.125 MG tablet Commonly known as: COREG  Take 1 tablet (3.125 mg total) by mouth 2 (two) times daily with a meal.   cyanocobalamin  1000 MCG tablet Commonly known as: VITAMIN B12 Take 1 tablet (1,000 mcg total) by mouth daily.   doxycycline  100 MG tablet Commonly known as: VIBRA -TABS Take 1 tablet (100 mg total) by mouth every 12 (twelve) hours for 14 days.   EPINEPHrine  0.3 mg/0.3 mL Soaj injection Commonly known as: EPI-PEN Inject 0.3 mg into the muscle as needed for anaphylaxis.   Fiber Adult Gummies 2 g Chew Take a directed on bottle   Iron  (Ferrous Sulfate ) 325 (65 Fe) MG Tabs Take 325 mg by mouth every other day.   metroNIDAZOLE  500 MG tablet Commonly known as: FLAGYL  Take 1 tablet (500 mg total) by mouth every 12 (twelve) hours for 14 days.  multivitamin with minerals tablet Take 1 tablet by mouth daily.   oxyCODONE -acetaminophen  5-325 MG tablet Commonly known as: PERCOCET/ROXICET Take 1-2 tablets by mouth every 6 (six) hours as needed for moderate pain (pain score 4-6).   polyethylene glycol 17 g packet Commonly known as: MIRALAX  / GLYCOLAX  Take 17 g by mouth 2 (two) times daily.   valsartan -hydrochlorothiazide  320-25 MG tablet Commonly known as: DIOVAN -HCT TAKE ONE TABLET BY MOUTH ONE TIME DAILY   Vitamin D  (Ergocalciferol ) 1.25 MG (50000 UNIT) Caps capsule Commonly known as: DRISDOL  Take 1 capsule (50,000 Units total) by mouth every 7 (seven) days.   Vitamin D3 75 MCG (3000  UT) Tabs Take 1 tablet by mouth daily at 6 (six) AM.        Follow-up Information     Eveline Lynwood MATSU, MD. Schedule an appointment as soon as possible for a visit in 6 week(s).   Specialty: Obstetrics and Gynecology Why: hospital follow up can actually be any MD including [facility] information: [address] [place] [address] [phone]                 Signed: Jerilynn DELENA Buddle 07/26/2023, 3:04 PM

## 2023-07-28 LAB — AEROBIC/ANAEROBIC CULTURE W GRAM STAIN (SURGICAL/DEEP WOUND)
Culture: NO GROWTH
Gram Stain: NONE SEEN

## 2023-08-03 NOTE — Telephone Encounter (Signed)
 Spoke with pt. She states She was told that she has 2 more possible abscesses at recent discharge from hospital.  She has been scheduled to see Gyn in August. States she is currently in [place]  taking care of her mother and did not bring the oxycodone  with her that was prescribed at discharge.  She has been taking tylenol  500mg , 2  tablets every 4-6 hours and has not been sufficient for pain control / cramping.  Has also been doing warm soaks in the tub. Wants to know if something can be sent to a pharmacy there for her pain? If so, would like to use [facility] on [address] in [place], [place] .  Also notes that she started having a clear discharge every time she urinated yesterday. Has not had any discharge today. She is still taking doxycycline  and metronidazole  and wonders if the discharge is normal?  Please advise.

## 2023-08-31 ENCOUNTER — Encounter: Payer: Self-pay | Admitting: Obstetrics and Gynecology

## 2023-08-31 ENCOUNTER — Ambulatory Visit: Admitting: Obstetrics and Gynecology

## 2023-08-31 VITALS — BP 160/112 | HR 81 | Wt 310.2 lb

## 2023-08-31 DIAGNOSIS — I10 Essential (primary) hypertension: Secondary | ICD-10-CM | POA: Diagnosis not present

## 2023-08-31 DIAGNOSIS — N7093 Salpingitis and oophoritis, unspecified: Secondary | ICD-10-CM | POA: Diagnosis not present

## 2023-08-31 MED ORDER — CARVEDILOL 3.125 MG PO TABS
3.1250 mg | ORAL_TABLET | Freq: Two times a day (BID) | ORAL | 1 refills | Status: DC
Start: 2023-08-31 — End: 2023-11-02

## 2023-08-31 NOTE — Progress Notes (Unsigned)
 GYNECOLOGY OFFICE FOLLOW UP NOTE  History:  43 y.o. [id number] here today for follow up for tubo-ovarian abscess requiring drainage. Took 2 weeks antibiotics after discharge from hospital on 07/26/23. Today she is feeling well, feels like she is back to baseline. No pain. Denies fever/chills/nausea/vomiting. Rear end pain where drain was location (removed day of discharge) much improved as well.     Past Medical History:  Diagnosis Date   Anemia    Back pain    Chest pain    Constipation    GERD (gastroesophageal reflux disease)    History of alcohol abuse    Hypertension    Hypokalemia    Iron  deficiency    Lactose intolerance    Mini stroke    Sleep apnea    Swelling of lower extremity    Vitamin B 12 deficiency    Vitamin D  deficiency     Past Surgical History:  Procedure Laterality Date   COLONOSCOPY WITH ESOPHAGOGASTRODUODENOSCOPY (EGD)  09/08/2022   [facility] [place], [place] [address]     Current Outpatient Medications:    albuterol  (VENTOLIN  HFA) 108 (90 Base) MCG/ACT inhaler, Inhale 2 puffs into the lungs every 6 (six) hours as needed for wheezing or shortness of breath., Disp: 18 g, Rfl: 2   amLODipine  (NORVASC ) 10 MG tablet, Take 1 tablet (10 mg total) by mouth daily., Disp: 90 tablet, Rfl: 0   buPROPion  (WELLBUTRIN  SR) 150 MG 12 hr tablet, Take 1 tablet (150 mg total) by mouth daily., Disp: 30 tablet, Rfl: 0   Cholecalciferol  (VITAMIN D3) 75 MCG (3000 UT) TABS, Take 1 tablet by mouth daily at 6 (six) AM., Disp: , Rfl:    cyanocobalamin  (VITAMIN B12) 1000 MCG tablet, Take 1 tablet (1,000 mcg total) by mouth daily., Disp: , Rfl:    EPINEPHrine  0.3 mg/0.3 mL IJ SOAJ injection, Inject 0.3 mg into the muscle as needed for anaphylaxis., Disp: 2 each, Rfl: 0   Fiber Adult Gummies 2 g CHEW, Take a directed on bottle, Disp: , Rfl:    Iron , Ferrous Sulfate , 325 (65 Fe) MG TABS, Take 325 mg by mouth every other day., Disp: 30 tablet, Rfl:    Multiple Vitamins-Minerals  (MULTIVITAMIN WITH MINERALS) tablet, Take 1 tablet by mouth daily., Disp: , Rfl:    oxyCODONE -acetaminophen  (PERCOCET/ROXICET) 5-325 MG tablet, Take 1-2 tablets by mouth every 6 (six) hours as needed for moderate pain (pain score 4-6)., Disp: 30 tablet, Rfl: 0   polyethylene glycol (MIRALAX  / GLYCOLAX ) 17 g packet, Take 17 g by mouth 2 (two) times daily., Disp: 14 each, Rfl: 0   valsartan -hydrochlorothiazide  (DIOVAN -HCT) 320-25 MG tablet, TAKE ONE TABLET BY MOUTH ONE TIME DAILY, Disp: 90 tablet, Rfl: 0   carvedilol  (COREG ) 3.125 MG tablet, Take 1 tablet (3.125 mg total) by mouth 2 (two) times daily with a meal., Disp: 60 tablet, Rfl: 1   Vitamin D , Ergocalciferol , (DRISDOL ) 1.25 MG (50000 UNIT) CAPS capsule, Take 1 capsule (50,000 Units total) by mouth every 7 (seven) days. (Patient not taking: Reported on 08/31/2023), Disp: 12 capsule, Rfl: 0  The following portions of the patient's history were reviewed and updated as appropriate: allergies, current medications, past family history, past medical history, past social history, past surgical history and problem list.   Review of Systems:  Pertinent items noted in HPI and remainder of comprehensive ROS otherwise negative.   Objective:  Physical Exam BP (!) 160/112 Comment: Per pt's request R lower arm  Pulse 81   Wt (!)  310 lb 3.2 oz (140.7 kg)   LMP 08/28/2023   Breastfeeding No   BMI 58.61 kg/m  CONSTITUTIONAL: Well-developed, well-nourished female in no acute distress.  HENT:  Normocephalic, atraumatic. External right and left ear normal. Oropharynx is clear and moist EYES: Conjunctivae and EOM are normal. Pupils are equal, round, and reactive to light. No scleral icterus.  NECK: Normal range of motion, supple, no masses SKIN: Skin is warm and dry. No rash noted. Not diaphoretic. No erythema. No pallor. NEUROLOGIC: Alert and oriented to person, place, and time. Normal reflexes, muscle tone coordination. No cranial nerve deficit  noted. PSYCHIATRIC: Normal mood and affect. Normal behavior. Normal judgment and thought content. CARDIOVASCULAR: Normal heart rate noted RESPIRATORY: Effort normal, no problems with respiration noted ABDOMEN: Soft, no distention noted.  No tenderness noted throughout PELVIC: deferred. MUSCULOSKELETAL: Normal range of motion. No edema noted.   Labs and Imaging No results found.  Assessment & Plan:  1. Essential hypertension - patient noted to have extremely high BP today - Refill given as patient has been missing meds x 2 weeks, states she hasn't been able to get refill with PCP despite attempts to contact - encourage her to continue to f/u with PCP for ongoing management - carvedilol  (COREG ) 3.125 MG tablet; Take 1 tablet (3.125 mg total) by mouth 2 (two) times daily with a meal.  Dispense: 60 tablet; Refill: 1  2. Tubo-ovarian abscess (Primary) - s/p IV antibiotics for TOA, underwent IR guided drainage placement - d/c home on 2 weeks abx which patient reports she took full course of (despite making her sick) - reports pain is completely gone, she has not felt sick like she did prior to hospitalization - reviewed guidelines for follow up of TOA are indeterminate, UTD recommends abx until documentation of resolution of abscess, however, she took 2 weeks as Rx and is feeling better, will plan for CT in 1 month (2 months after original) and if bigger/worsening or not significantly improved in size, may repeat course, she is agreeable to this plan - return precautions given - CT ABDOMEN PELVIS W CONTRAST; Future   Routine preventative health maintenance measures emphasized. Please refer to After Visit Summary for other counseling recommendations.   Return in about 1 month (around 10/01/2023) for f/u after CT.   LOIS Yolanda Moats, MD, [facility] Attending Center for [facility])

## 2023-10-01 ENCOUNTER — Ambulatory Visit (HOSPITAL_COMMUNITY)

## 2023-10-07 ENCOUNTER — Encounter: Payer: Self-pay | Admitting: Obstetrics and Gynecology

## 2023-10-07 ENCOUNTER — Other Ambulatory Visit: Payer: Self-pay

## 2023-10-07 ENCOUNTER — Ambulatory Visit (INDEPENDENT_AMBULATORY_CARE_PROVIDER_SITE_OTHER): Admitting: Obstetrics and Gynecology

## 2023-10-07 ENCOUNTER — Ambulatory Visit (HOSPITAL_COMMUNITY)
Admission: RE | Admit: 2023-10-07 | Discharge: 2023-10-07 | Disposition: A | Discharge status: Home / Self Care | Source: Ambulatory Visit | Attending: Obstetrics and Gynecology | Admitting: Obstetrics and Gynecology

## 2023-10-07 VITALS — BP 151/108 | HR 88 | Wt 316.0 lb

## 2023-10-07 DIAGNOSIS — N7093 Salpingitis and oophoritis, unspecified: Secondary | ICD-10-CM | POA: Diagnosis present

## 2023-10-07 DIAGNOSIS — Z3009 Encounter for other general counseling and advice on contraception: Secondary | ICD-10-CM

## 2023-10-07 MED ORDER — PHEXXI 1.8-1-0.4 % VA GEL: VAGINAL | 2 refills | Status: AC

## 2023-10-07 MED ORDER — IOHEXOL 300 MG/ML  SOLN
100.0000 mL | Freq: Once | INTRAMUSCULAR | Status: AC | PRN
  Administered 2023-10-07: 100 mL via INTRAVENOUS

## 2023-10-07 NOTE — Progress Notes (Signed)
 GYNECOLOGY VISIT  Patient name: Cristina Adkins MRN [id number]  Date of birth: 21-Mar-1980 Chief Complaint:   Follow-up  History:  Discussed the use of AI scribe software for clinical note transcription with the patient, who gave verbal consent to proceed.  History of Present Illness Cristina Adkins is a 43 year old female who presents for follow-up of pelvic pain and gastrointestinal symptoms.  She experiences intermittent cramping in the same spot, which is uncomfortable but not severe enough to require medication. The cramping eases with walking. She was previously hospitalized for a tubal ovarian abscess, which was treated with antibiotics.  She has a history of colitis and experiences gastrointestinal distress after consuming spicy food, resulting in significant stomach pain for four days. She acknowledges a B12 deficiency and is under the care of a gastroenterologist.  She has made dietary changes, including eliminating beef for almost a year, and is focusing on eating healthy. Despite these efforts, her weight has not decreased, attributing some of her weight to muscle mass as assessed by a nutritionist.  She discusses her menstrual history and family history of menopause, noting that her mother experienced menopause without significant symptoms. She expresses concern about potential pregnancy risks and has not used consistent contraception.  Per chart review: discharged 6/30 for TOA admission after transgluteal IR drainage and removal of drain due to pain. Prior to removal, CT scan showed resolution of original abscess but 2 new collections as well as diverticulosis. Outpatient Gyn ordered for interval CT to reassess collections, completed this AM.   The following portions of the patient's history were reviewed and updated as appropriate: allergies, current medications, past family history, past medical history, past social history, past surgical history and problem list.    Health Maintenance:   Last pap     Component Value Date/Time   DIAGPAP  03/05/2021 1138    - Negative for intraepithelial lesion or malignancy (NILM)   HPVHIGH Negative 03/05/2021 1138   ADEQPAP  03/05/2021 1138    Satisfactory for evaluation; transformation zone component PRESENT.    Health Maintenance  Topic Date Due   DTaP/Tdap/Td vaccine (1 - Tdap) Never done   Pneumococcal Vaccine (1 of 2 - PCV) Never done   Hepatitis B Vaccine (1 of 3 - 19+ 3-dose series) Never done   HPV Vaccine (1 - 3-dose SCDM series) Never done   Breast Cancer Screening  Never done   COVID-19 Vaccine (1 - 2024-25 season) Never done   Hepatitis C Screening  01/13/2024*   Pap with HPV screening  03/05/2026   HIV Screening  Completed   Meningitis B Vaccine  Aged Out   Flu Shot  Discontinued  *Topic was postponed. The date shown is not the original due date.      Review of Systems:  Pertinent items are noted in HPI. Comprehensive review of systems was otherwise negative.   Objective:  Physical Exam BP (!) 151/108   Pulse 88   Wt (!) 316 lb (143.3 kg)   LMP 09/23/2023 (Approximate)   BMI 59.71 kg/m    Physical Exam Vitals and nursing note reviewed.  Constitutional:      Appearance: Normal appearance.  HENT:     Head: Normocephalic and atraumatic.  Pulmonary:     Effort: Pulmonary effort is normal.  Skin:    General: Skin is warm and dry.  Neurological:     General: No focal deficit present.     Mental Status: She is alert.  Psychiatric:  Mood and Affect: Mood normal.        Behavior: Behavior normal.        Thought Content: Thought content normal.        Judgment: Judgment normal.      Labs and Imaging CT ABDOMEN PELVIS W CONTRAST Result Date: 10/07/2023 EXAM: CT ABDOMEN AND PELVIS WITH CONTRAST 10/07/2023 08:49:24 AM TECHNIQUE: CT of the abdomen and pelvis was performed with the administration of intravenous contrast. Multiplanar reformatted images are provided for  review. Automated exposure control, iterative reconstruction, and/or weight-based adjustment of the mA/kV was utilized to reduce the radiation dose to as low as reasonably achievable. COMPARISON: None available. CLINICAL HISTORY: Intra-abdominal abscess; h/o tubo-ovarian abscess, follow up. FINDINGS: LOWER CHEST: No acute abnormality. LIVER: The liver is unremarkable. GALLBLADDER AND BILE DUCTS: Gallbladder is unremarkable. No biliary ductal dilatation. SPLEEN: No acute abnormality. PANCREAS: No acute abnormality. ADRENAL GLANDS: No acute abnormality. KIDNEYS, URETERS AND BLADDER: No stones in the kidneys or ureters. No hydronephrosis. No perinephric or periureteral stranding. Urinary bladder is unremarkable. GI AND BOWEL: Diverticular changes are present in the distal descending and sigmoid colon. Mild inflammatory changes are present about this portion of the colon. There is no bowel obstruction. PERITONEUM AND RETROPERITONEUM: A small amount of free fluid is present within the anatomic pelvis, slightly more prominent on the left. No residual discrete or drainable abscess is present. No free air is present. VASCULATURE: Aorta is normal in caliber. LYMPH NODES: No lymphadenopathy. REPRODUCTIVE ORGANS: No acute abnormality. BONES AND SOFT TISSUES: No acute osseous abnormality. No focal soft tissue abnormality. IMPRESSION: 1. No residual discrete or drainable abscess. 2. Diverticular changes in the distal descending and sigmoid colon with mild inflammatory changes. This may be secondary inflammatory change related to the previous abscess. Primary diverticulitis is also considered. Electronically signed by: Lonni Necessary MD 10/07/2023 12:04 PM EDT RP Workstation: [id number]       Assessment & Plan:   Assessment & Plan Post-tubo-ovarian abscess status Intermittent cramping likely due to residual inflammation and muscle spasms. CT scan shows no residual abscess, indicating resolution. - noted that it can  take several weeks for complete resolution of inflammation - inflammation of gyn organs can also affect surrounding structures including pelvic floor   Colitis Exacerbation after spicy food, with abdominal pain and diarrhea. CT scan showed diverticular changes, likely due to recent infection and inflammation. She is aware of dietary triggers and managing through diet.  Contraceptive counseling Not using contraception currently and does not desire to get pregnant- allergic to latex, not interested in hormonal methods and not able to combination hormonal contraception due to hypertension, partner unwilling for vasectomy. Interested in non-hormonal options. - Prescribed phexi gel for non-hormonal contraception.   Carter Quarry, MD Minimally Invasive Gynecologic Surgery Center for [facility], [facility]

## 2023-10-10 ENCOUNTER — Ambulatory Visit: Payer: Self-pay | Admitting: Obstetrics and Gynecology

## 2023-10-14 ENCOUNTER — Telehealth: Payer: Self-pay | Admitting: Lactation Services

## 2023-10-14 NOTE — Telephone Encounter (Signed)
 Awaiting determination of PA for Phexxi   Juliya Leamer (Key: [id number]) PA Case ID #: [id number] Rx #: [id number] Need Help? Call us  at [phone] Status sent iconSent to Plan today Drug Phexxi  1.8-1-0.4% gel ePA cloud logo Form [facility] Electronic Prior Authorization Form (2017 NCPDP) Original Claim Info 70 Alt Options: OTC spermicides

## 2023-10-19 NOTE — Telephone Encounter (Signed)
 Bindi Simms (Key: [id number]) PA Case ID #: [id number] Rx #: [id number] Need Help? Call us  at [phone] Archived on September 22 by Waddell Spencer Outcome Approved on September 19 by [facility] 2017 NCPDP Request Reference Number: [id number]. PHEXXI  GEL is approved through 10/13/2024. Your patient may now fill this prescription and it will be covered. Effective Date: 10/14/2023 Authorization Expiration Date: 10/13/2024 Drug Phexxi  1.8-1-0.4% gel ePA cloud logo Form [facility] Electronic Prior Authorization Form [phone] NCPDP) Original Claim Info 70 Alt Options: OTC spermicides

## 2023-10-19 NOTE — Telephone Encounter (Signed)
 Called [facility] to let them know Phexxi  has been approved.  Pharmacy reports the prescription did not allow it to go through, gave PA number and she is calling the Pharmacy to clarify.

## 2023-10-31 ENCOUNTER — Other Ambulatory Visit: Payer: Self-pay | Admitting: Obstetrics and Gynecology

## 2023-10-31 DIAGNOSIS — I10 Essential (primary) hypertension: Secondary | ICD-10-CM

## 2023-11-08 NOTE — Addendum Note (Signed)
 Encounter addended by: Levora Maeola CROME on: 11/08/2023 11:54 AM  Actions taken: Imaging Exam ended

## 2024-01-02 ENCOUNTER — Other Ambulatory Visit: Payer: Self-pay | Admitting: Obstetrics and Gynecology

## 2024-01-02 DIAGNOSIS — I10 Essential (primary) hypertension: Secondary | ICD-10-CM

## 2024-02-08 ENCOUNTER — Encounter: Payer: Self-pay | Admitting: Family

## 2024-02-08 DIAGNOSIS — I10 Essential (primary) hypertension: Secondary | ICD-10-CM

## 2024-02-09 MED ORDER — AMLODIPINE BESYLATE 10 MG PO TABS: 10.0000 mg | ORAL_TABLET | Freq: Every day | ORAL | 0 refills | Status: AC

## 2024-02-09 MED ORDER — VALSARTAN-HYDROCHLOROTHIAZIDE 320-25 MG PO TABS: 1.0000 | ORAL_TABLET | Freq: Every day | ORAL | 0 refills | Status: AC
